# Patient Record
Sex: Female | Born: 1950 | Race: White | Hispanic: No | Marital: Married | Smoking: Never smoker
Health system: Southern US, Community
[De-identification: ages and names within clinical notes are randomized; demographics above are authoritative.]

## PROBLEM LIST (undated history)

## (undated) DIAGNOSIS — G4730 Sleep apnea, unspecified: Secondary | ICD-10-CM

## (undated) DIAGNOSIS — I10 Essential (primary) hypertension: Secondary | ICD-10-CM

## (undated) DIAGNOSIS — N8189 Other female genital prolapse: Secondary | ICD-10-CM

## (undated) DIAGNOSIS — E119 Type 2 diabetes mellitus without complications: Secondary | ICD-10-CM

## (undated) DIAGNOSIS — G43909 Migraine, unspecified, not intractable, without status migrainosus: Secondary | ICD-10-CM

## (undated) DIAGNOSIS — K219 Gastro-esophageal reflux disease without esophagitis: Secondary | ICD-10-CM

## (undated) DIAGNOSIS — IMO0002 Reserved for concepts with insufficient information to code with codable children: Secondary | ICD-10-CM

## (undated) DIAGNOSIS — M1990 Unspecified osteoarthritis, unspecified site: Secondary | ICD-10-CM

## (undated) DIAGNOSIS — E785 Hyperlipidemia, unspecified: Secondary | ICD-10-CM

## (undated) DIAGNOSIS — R0902 Hypoxemia: Secondary | ICD-10-CM

## (undated) DIAGNOSIS — C801 Malignant (primary) neoplasm, unspecified: Secondary | ICD-10-CM

## (undated) DIAGNOSIS — T7840XA Allergy, unspecified, initial encounter: Secondary | ICD-10-CM

## (undated) HISTORY — PX: EYE SURGERY: SHX253

## (undated) HISTORY — DX: Gastro-esophageal reflux disease without esophagitis: K21.9

## (undated) HISTORY — DX: Essential (primary) hypertension: I10

## (undated) HISTORY — DX: Type 2 diabetes mellitus without complications: E11.9

## (undated) HISTORY — DX: Hyperlipidemia, unspecified: E78.5

## (undated) HISTORY — PX: OTHER SURGICAL HISTORY: SHX169

## (undated) HISTORY — PX: SPINE SURGERY: SHX786

## (undated) HISTORY — DX: Other female genital prolapse: N81.89

## (undated) HISTORY — DX: Allergy, unspecified, initial encounter: T78.40XA

## (undated) HISTORY — DX: Migraine, unspecified, not intractable, without status migrainosus: G43.909

## (undated) HISTORY — DX: Hypoxemia: R09.02

## (undated) HISTORY — DX: Unspecified osteoarthritis, unspecified site: M19.90

## (undated) HISTORY — DX: Sleep apnea, unspecified: G47.30

## (undated) HISTORY — DX: Reserved for concepts with insufficient information to code with codable children: IMO0002

---

## 1980-12-14 HISTORY — PX: TUBAL LIGATION: SHX77

## 1994-12-14 HISTORY — PX: VAGINAL HYSTERECTOMY: SUR661

## 2008-12-14 HISTORY — PX: KNEE ARTHROSCOPY: SHX127

## 2009-04-08 ENCOUNTER — Ambulatory Visit: Payer: Self-pay | Admitting: Family Medicine

## 2009-06-20 ENCOUNTER — Emergency Department: Payer: Self-pay | Admitting: Emergency Medicine

## 2009-12-14 HISTORY — PX: COLONOSCOPY: SHX5424

## 2010-04-13 DIAGNOSIS — I219 Acute myocardial infarction, unspecified: Secondary | ICD-10-CM

## 2010-04-13 HISTORY — DX: Acute myocardial infarction, unspecified: I21.9

## 2010-04-24 ENCOUNTER — Emergency Department: Payer: Self-pay | Admitting: Emergency Medicine

## 2010-12-25 ENCOUNTER — Ambulatory Visit: Payer: Self-pay | Admitting: Family Medicine

## 2010-12-30 ENCOUNTER — Ambulatory Visit: Payer: Self-pay | Admitting: Family Medicine

## 2010-12-31 ENCOUNTER — Ambulatory Visit: Payer: Self-pay | Admitting: Family Medicine

## 2012-01-05 ENCOUNTER — Ambulatory Visit: Payer: Self-pay | Admitting: Family Medicine

## 2012-03-03 DIAGNOSIS — M51379 Other intervertebral disc degeneration, lumbosacral region without mention of lumbar back pain or lower extremity pain: Secondary | ICD-10-CM | POA: Insufficient documentation

## 2012-03-03 DIAGNOSIS — M5137 Other intervertebral disc degeneration, lumbosacral region: Secondary | ICD-10-CM | POA: Insufficient documentation

## 2012-03-03 DIAGNOSIS — IMO0002 Reserved for concepts with insufficient information to code with codable children: Secondary | ICD-10-CM | POA: Insufficient documentation

## 2012-03-03 DIAGNOSIS — M48061 Spinal stenosis, lumbar region without neurogenic claudication: Secondary | ICD-10-CM | POA: Insufficient documentation

## 2012-08-31 ENCOUNTER — Ambulatory Visit: Payer: Self-pay | Admitting: Family Medicine

## 2012-09-13 ENCOUNTER — Ambulatory Visit: Payer: Self-pay | Admitting: Family Medicine

## 2013-02-28 ENCOUNTER — Ambulatory Visit: Payer: Self-pay | Admitting: Family Medicine

## 2014-03-22 ENCOUNTER — Ambulatory Visit: Payer: Self-pay | Admitting: Family Medicine

## 2014-08-28 DIAGNOSIS — Z01810 Encounter for preprocedural cardiovascular examination: Secondary | ICD-10-CM | POA: Diagnosis not present

## 2014-08-28 DIAGNOSIS — E785 Hyperlipidemia, unspecified: Secondary | ICD-10-CM | POA: Diagnosis not present

## 2014-08-28 DIAGNOSIS — I10 Essential (primary) hypertension: Secondary | ICD-10-CM | POA: Diagnosis not present

## 2014-08-28 DIAGNOSIS — I2510 Atherosclerotic heart disease of native coronary artery without angina pectoris: Secondary | ICD-10-CM | POA: Diagnosis not present

## 2014-08-29 DIAGNOSIS — E119 Type 2 diabetes mellitus without complications: Secondary | ICD-10-CM | POA: Diagnosis not present

## 2014-08-29 DIAGNOSIS — I10 Essential (primary) hypertension: Secondary | ICD-10-CM | POA: Diagnosis not present

## 2014-08-29 DIAGNOSIS — G43909 Migraine, unspecified, not intractable, without status migrainosus: Secondary | ICD-10-CM | POA: Diagnosis not present

## 2014-08-29 DIAGNOSIS — K219 Gastro-esophageal reflux disease without esophagitis: Secondary | ICD-10-CM | POA: Diagnosis not present

## 2014-08-29 DIAGNOSIS — Z0000 Encounter for general adult medical examination without abnormal findings: Secondary | ICD-10-CM | POA: Diagnosis not present

## 2014-08-29 DIAGNOSIS — J3089 Other allergic rhinitis: Secondary | ICD-10-CM | POA: Diagnosis not present

## 2014-08-29 DIAGNOSIS — E785 Hyperlipidemia, unspecified: Secondary | ICD-10-CM | POA: Diagnosis not present

## 2014-08-29 DIAGNOSIS — Z23 Encounter for immunization: Secondary | ICD-10-CM | POA: Diagnosis not present

## 2014-09-03 DIAGNOSIS — G4730 Sleep apnea, unspecified: Secondary | ICD-10-CM | POA: Insufficient documentation

## 2014-09-03 DIAGNOSIS — Z01818 Encounter for other preprocedural examination: Secondary | ICD-10-CM | POA: Diagnosis not present

## 2014-09-03 DIAGNOSIS — Z79899 Other long term (current) drug therapy: Secondary | ICD-10-CM | POA: Diagnosis not present

## 2014-09-11 DIAGNOSIS — E119 Type 2 diabetes mellitus without complications: Secondary | ICD-10-CM | POA: Diagnosis not present

## 2014-09-11 DIAGNOSIS — L919 Hypertrophic disorder of the skin, unspecified: Secondary | ICD-10-CM | POA: Diagnosis not present

## 2014-09-11 DIAGNOSIS — I10 Essential (primary) hypertension: Secondary | ICD-10-CM | POA: Diagnosis not present

## 2014-09-11 DIAGNOSIS — I252 Old myocardial infarction: Secondary | ICD-10-CM | POA: Diagnosis not present

## 2014-09-11 DIAGNOSIS — L909 Atrophic disorder of skin, unspecified: Secondary | ICD-10-CM | POA: Diagnosis not present

## 2014-09-11 DIAGNOSIS — Z9861 Coronary angioplasty status: Secondary | ICD-10-CM | POA: Diagnosis not present

## 2014-09-11 DIAGNOSIS — D2310 Other benign neoplasm of skin of unspecified eyelid, including canthus: Secondary | ICD-10-CM | POA: Diagnosis not present

## 2014-09-11 DIAGNOSIS — Z79899 Other long term (current) drug therapy: Secondary | ICD-10-CM | POA: Diagnosis not present

## 2014-09-11 DIAGNOSIS — I2510 Atherosclerotic heart disease of native coronary artery without angina pectoris: Secondary | ICD-10-CM | POA: Diagnosis not present

## 2014-09-11 DIAGNOSIS — H02839 Dermatochalasis of unspecified eye, unspecified eyelid: Secondary | ICD-10-CM | POA: Diagnosis not present

## 2014-09-11 DIAGNOSIS — L989 Disorder of the skin and subcutaneous tissue, unspecified: Secondary | ICD-10-CM | POA: Diagnosis not present

## 2014-09-20 DIAGNOSIS — H02834 Dermatochalasis of left upper eyelid: Secondary | ICD-10-CM

## 2014-09-20 DIAGNOSIS — H02831 Dermatochalasis of right upper eyelid: Secondary | ICD-10-CM | POA: Insufficient documentation

## 2014-10-02 DIAGNOSIS — Z01419 Encounter for gynecological examination (general) (routine) without abnormal findings: Secondary | ICD-10-CM | POA: Diagnosis not present

## 2014-10-10 DIAGNOSIS — M170 Bilateral primary osteoarthritis of knee: Secondary | ICD-10-CM | POA: Diagnosis not present

## 2014-10-10 DIAGNOSIS — M179 Osteoarthritis of knee, unspecified: Secondary | ICD-10-CM | POA: Diagnosis not present

## 2014-11-14 ENCOUNTER — Ambulatory Visit: Payer: Self-pay | Admitting: Emergency Medicine

## 2014-11-14 DIAGNOSIS — I10 Essential (primary) hypertension: Secondary | ICD-10-CM | POA: Diagnosis not present

## 2014-11-14 DIAGNOSIS — N390 Urinary tract infection, site not specified: Secondary | ICD-10-CM | POA: Diagnosis not present

## 2014-11-14 LAB — URINALYSIS, COMPLETE
Bacteria: NEGATIVE
Bilirubin,UR: NEGATIVE
Blood: NEGATIVE
Glucose,UR: NEGATIVE
Ketone: NEGATIVE
Nitrite: NEGATIVE
Ph: 5.5 (ref 5.0–8.0)
Protein: NEGATIVE
RBC,UR: NONE SEEN /HPF (ref 0–5)
Specific Gravity: 1.01 (ref 1.000–1.030)
Squamous Epithelial: NONE SEEN
WBC UR: >30 /HPF (ref 0–5)

## 2014-11-16 LAB — URINE CULTURE

## 2014-12-12 DIAGNOSIS — J329 Chronic sinusitis, unspecified: Secondary | ICD-10-CM | POA: Diagnosis not present

## 2014-12-12 DIAGNOSIS — H109 Unspecified conjunctivitis: Secondary | ICD-10-CM | POA: Diagnosis not present

## 2014-12-12 DIAGNOSIS — N390 Urinary tract infection, site not specified: Secondary | ICD-10-CM | POA: Diagnosis not present

## 2015-01-10 DIAGNOSIS — Z23 Encounter for immunization: Secondary | ICD-10-CM | POA: Diagnosis not present

## 2015-01-25 DIAGNOSIS — E119 Type 2 diabetes mellitus without complications: Secondary | ICD-10-CM | POA: Diagnosis not present

## 2015-01-25 DIAGNOSIS — E784 Other hyperlipidemia: Secondary | ICD-10-CM | POA: Diagnosis not present

## 2015-01-25 DIAGNOSIS — I10 Essential (primary) hypertension: Secondary | ICD-10-CM | POA: Diagnosis not present

## 2015-02-27 DIAGNOSIS — G43C1 Periodic headache syndromes in child or adult, intractable: Secondary | ICD-10-CM | POA: Diagnosis not present

## 2015-02-27 DIAGNOSIS — E784 Other hyperlipidemia: Secondary | ICD-10-CM | POA: Diagnosis not present

## 2015-02-27 DIAGNOSIS — Z1231 Encounter for screening mammogram for malignant neoplasm of breast: Secondary | ICD-10-CM | POA: Diagnosis not present

## 2015-02-27 DIAGNOSIS — K219 Gastro-esophageal reflux disease without esophagitis: Secondary | ICD-10-CM | POA: Diagnosis not present

## 2015-02-27 DIAGNOSIS — I10 Essential (primary) hypertension: Secondary | ICD-10-CM | POA: Diagnosis not present

## 2015-02-27 DIAGNOSIS — E119 Type 2 diabetes mellitus without complications: Secondary | ICD-10-CM | POA: Diagnosis not present

## 2015-02-27 DIAGNOSIS — J3081 Allergic rhinitis due to animal (cat) (dog) hair and dander: Secondary | ICD-10-CM | POA: Diagnosis not present

## 2015-02-27 LAB — LIPID PANEL
Cholesterol: 167 mg/dL (ref 0–200)
HDL: 46 mg/dL (ref 35–70)
LDL Cholesterol: 93 mg/dL
Triglycerides: 141 mg/dL (ref 40–160)

## 2015-02-27 LAB — BASIC METABOLIC PANEL
Creatinine: 0.6 mg/dL (ref <=1.1)
Glucose: 143 mg/dL

## 2015-02-27 LAB — HEMOGLOBIN A1C: Hgb A1c MFr Bld: 7.4 % — AB (ref 4.0–6.0)

## 2015-03-13 DIAGNOSIS — H21233 Degeneration of iris (pigmentary), bilateral: Secondary | ICD-10-CM | POA: Diagnosis not present

## 2015-03-13 DIAGNOSIS — E119 Type 2 diabetes mellitus without complications: Secondary | ICD-10-CM | POA: Diagnosis not present

## 2015-03-25 ENCOUNTER — Ambulatory Visit
Admit: 2015-03-25 | Disposition: A | Discharge status: Home / Self Care | Payer: Self-pay | Attending: Family Medicine | Admitting: Family Medicine

## 2015-03-25 DIAGNOSIS — Z1231 Encounter for screening mammogram for malignant neoplasm of breast: Secondary | ICD-10-CM | POA: Diagnosis not present

## 2015-03-25 LAB — HM MAMMOGRAPHY: HM Mammogram: NORMAL

## 2015-04-03 DIAGNOSIS — E7849 Other hyperlipidemia: Secondary | ICD-10-CM

## 2015-04-03 DIAGNOSIS — E1165 Type 2 diabetes mellitus with hyperglycemia: Secondary | ICD-10-CM | POA: Insufficient documentation

## 2015-04-03 DIAGNOSIS — Z0000 Encounter for general adult medical examination without abnormal findings: Secondary | ICD-10-CM | POA: Insufficient documentation

## 2015-04-03 DIAGNOSIS — J3081 Allergic rhinitis due to animal (cat) (dog) hair and dander: Secondary | ICD-10-CM | POA: Insufficient documentation

## 2015-04-03 DIAGNOSIS — R51 Headache: Secondary | ICD-10-CM

## 2015-04-03 DIAGNOSIS — I2510 Atherosclerotic heart disease of native coronary artery without angina pectoris: Secondary | ICD-10-CM | POA: Insufficient documentation

## 2015-04-03 DIAGNOSIS — Z794 Long term (current) use of insulin: Secondary | ICD-10-CM

## 2015-04-03 DIAGNOSIS — R739 Hyperglycemia, unspecified: Secondary | ICD-10-CM | POA: Insufficient documentation

## 2015-04-03 DIAGNOSIS — R519 Headache, unspecified: Secondary | ICD-10-CM | POA: Insufficient documentation

## 2015-04-03 DIAGNOSIS — I10 Essential (primary) hypertension: Secondary | ICD-10-CM | POA: Insufficient documentation

## 2015-04-03 DIAGNOSIS — E785 Hyperlipidemia, unspecified: Secondary | ICD-10-CM | POA: Insufficient documentation

## 2015-04-24 DIAGNOSIS — R202 Paresthesia of skin: Secondary | ICD-10-CM | POA: Diagnosis not present

## 2015-04-24 DIAGNOSIS — R200 Anesthesia of skin: Secondary | ICD-10-CM | POA: Diagnosis not present

## 2015-04-24 DIAGNOSIS — E119 Type 2 diabetes mellitus without complications: Secondary | ICD-10-CM | POA: Diagnosis not present

## 2015-04-24 DIAGNOSIS — E1165 Type 2 diabetes mellitus with hyperglycemia: Secondary | ICD-10-CM | POA: Diagnosis not present

## 2015-07-16 DIAGNOSIS — R202 Paresthesia of skin: Secondary | ICD-10-CM | POA: Diagnosis not present

## 2015-07-16 DIAGNOSIS — E119 Type 2 diabetes mellitus without complications: Secondary | ICD-10-CM | POA: Diagnosis not present

## 2015-07-16 DIAGNOSIS — E538 Deficiency of other specified B group vitamins: Secondary | ICD-10-CM | POA: Diagnosis not present

## 2015-07-16 DIAGNOSIS — R200 Anesthesia of skin: Secondary | ICD-10-CM | POA: Diagnosis not present

## 2015-07-23 DIAGNOSIS — E1165 Type 2 diabetes mellitus with hyperglycemia: Secondary | ICD-10-CM | POA: Diagnosis not present

## 2015-07-23 DIAGNOSIS — E785 Hyperlipidemia, unspecified: Secondary | ICD-10-CM | POA: Diagnosis not present

## 2015-07-23 DIAGNOSIS — E538 Deficiency of other specified B group vitamins: Secondary | ICD-10-CM | POA: Diagnosis not present

## 2015-07-31 DIAGNOSIS — M25551 Pain in right hip: Secondary | ICD-10-CM | POA: Diagnosis not present

## 2015-07-31 DIAGNOSIS — M1611 Unilateral primary osteoarthritis, right hip: Secondary | ICD-10-CM | POA: Insufficient documentation

## 2015-07-31 DIAGNOSIS — M7061 Trochanteric bursitis, right hip: Secondary | ICD-10-CM | POA: Diagnosis not present

## 2015-08-08 DIAGNOSIS — M1611 Unilateral primary osteoarthritis, right hip: Secondary | ICD-10-CM | POA: Diagnosis not present

## 2015-08-13 ENCOUNTER — Other Ambulatory Visit: Payer: Self-pay | Admitting: Family Medicine

## 2015-08-25 ENCOUNTER — Other Ambulatory Visit: Payer: Self-pay | Admitting: Family Medicine

## 2015-08-25 DIAGNOSIS — I10 Essential (primary) hypertension: Secondary | ICD-10-CM

## 2015-09-02 ENCOUNTER — Encounter: Payer: Self-pay | Admitting: Family Medicine

## 2015-09-02 ENCOUNTER — Ambulatory Visit (INDEPENDENT_AMBULATORY_CARE_PROVIDER_SITE_OTHER): Payer: Medicare Other | Admitting: Family Medicine

## 2015-09-02 VITALS — BP 120/80 | HR 58 | Ht 65.0 in | Wt 185.0 lb

## 2015-09-02 DIAGNOSIS — Z23 Encounter for immunization: Secondary | ICD-10-CM | POA: Diagnosis not present

## 2015-09-02 DIAGNOSIS — I10 Essential (primary) hypertension: Secondary | ICD-10-CM

## 2015-09-02 DIAGNOSIS — E785 Hyperlipidemia, unspecified: Secondary | ICD-10-CM

## 2015-09-02 DIAGNOSIS — E784 Other hyperlipidemia: Secondary | ICD-10-CM

## 2015-09-02 DIAGNOSIS — J301 Allergic rhinitis due to pollen: Secondary | ICD-10-CM | POA: Diagnosis not present

## 2015-09-02 DIAGNOSIS — E7849 Other hyperlipidemia: Secondary | ICD-10-CM

## 2015-09-02 DIAGNOSIS — K219 Gastro-esophageal reflux disease without esophagitis: Secondary | ICD-10-CM

## 2015-09-02 DIAGNOSIS — Z0000 Encounter for general adult medical examination without abnormal findings: Secondary | ICD-10-CM | POA: Diagnosis not present

## 2015-09-02 DIAGNOSIS — Z1239 Encounter for other screening for malignant neoplasm of breast: Secondary | ICD-10-CM | POA: Diagnosis not present

## 2015-09-02 DIAGNOSIS — Z1231 Encounter for screening mammogram for malignant neoplasm of breast: Secondary | ICD-10-CM | POA: Diagnosis not present

## 2015-09-02 DIAGNOSIS — G43109 Migraine with aura, not intractable, without status migrainosus: Secondary | ICD-10-CM | POA: Diagnosis not present

## 2015-09-02 MED ORDER — AMLODIPINE BESYLATE 5 MG PO TABS: 5.0000 mg | ORAL_TABLET | Freq: Every day | ORAL | Status: DC

## 2015-09-02 MED ORDER — ATORVASTATIN CALCIUM 40 MG PO TABS: 40.0000 mg | ORAL_TABLET | Freq: Every day | ORAL | Status: DC

## 2015-09-02 MED ORDER — FLUTICASONE PROPIONATE 50 MCG/ACT NA SUSP: 2.0000 | Freq: Every day | NASAL | Status: DC

## 2015-09-02 MED ORDER — HYDROCHLOROTHIAZIDE 25 MG PO TABS: 25.0000 mg | ORAL_TABLET | Freq: Every morning | ORAL | Status: DC

## 2015-09-02 MED ORDER — SUMATRIPTAN SUCCINATE 50 MG PO TABS: 50.0000 mg | ORAL_TABLET | ORAL | Status: DC | PRN

## 2015-09-02 MED ORDER — PANTOPRAZOLE SODIUM 40 MG PO TBEC: 40.0000 mg | DELAYED_RELEASE_TABLET | Freq: Every day | ORAL | Status: DC

## 2015-09-02 MED ORDER — LISINOPRIL 10 MG PO TABS: 10.0000 mg | ORAL_TABLET | Freq: Every day | ORAL | Status: DC

## 2015-09-02 MED ORDER — MONTELUKAST SODIUM 10 MG PO TABS: 10.0000 mg | ORAL_TABLET | Freq: Every day | ORAL | Status: DC

## 2015-09-02 MED ORDER — METOPROLOL SUCCINATE ER 50 MG PO TB24: 50.0000 mg | ORAL_TABLET | Freq: Every day | ORAL | Status: DC

## 2015-09-02 NOTE — Progress Notes (Signed)
Name: Nicole Cook   MRN: [id number]    DOB: 02-04-1951   Date:09/02/2015       Progress Note  Subjective  Chief Complaint  Chief Complaint  Patient presents with  . Annual Exam    no pap  . Gastrophageal Reflux  . Hypertension  . Hyperlipidemia  . Allergic Rhinitis   . Headache    migraine- imitrex refill    HPI Comments: Annual physical exam with no  subjective/objective concerns.  Gastrophageal Reflux She complains of heartburn. She reports no abdominal pain, no belching, no chest pain, no choking, no coughing, no dysphagia, no nausea, no sore throat or no wheezing. This is a new problem. The current episode started more than 1 year ago. The problem occurs occasionally. The problem has been gradually improving. The symptoms are aggravated by certain foods (supine position). Pertinent negatives include no anemia, fatigue, melena, muscle weakness, orthopnea or weight loss. There are no known risk factors. She has tried a PPI for the symptoms. The treatment provided mild relief.  Hypertension This is a chronic problem. The current episode started more than 1 year ago. The problem has been waxing and waning since onset. The problem is controlled. Associated symptoms include blurred vision, headaches and neck pain. Pertinent negatives include no anxiety, chest pain, malaise/fatigue, palpitations or shortness of breath. There are no associated agents to hypertension. Risk factors for coronary artery disease include diabetes mellitus, dyslipidemia and obesity. Past treatments include beta blockers, ACE inhibitors, diuretics and calcium channel blockers. The current treatment provides mild improvement. There are no compliance problems.  There is no history of angina, kidney disease, CAD/MI, CVA, heart failure, left ventricular hypertrophy, PVD, renovascular disease or retinopathy. There is no history of chronic renal disease.  Hyperlipidemia This is a chronic problem. The current episode  started more than 1 year ago. The problem is controlled. Recent lipid tests were reviewed and are low. Exacerbating diseases include obesity. She has no history of chronic renal disease, diabetes, hypothyroidism, liver disease or nephrotic syndrome. There are no known factors aggravating her hyperlipidemia. Pertinent negatives include no chest pain, focal sensory loss, focal weakness, leg pain, myalgias or shortness of breath. The current treatment provides mild improvement of lipids. There are no compliance problems.   Headache  This is a recurrent problem. The current episode started more than 1 year ago. The problem occurs intermittently. The problem has been waxing and waning. The pain is located in the retro-orbital and bilateral region. The pain quality is similar to prior headaches. The quality of the pain is described as aching. The pain is at a severity of 5/10. Associated symptoms include blurred vision, neck pain, phonophobia and photophobia. Pertinent negatives include no abdominal pain, abnormal behavior, back pain, coughing, dizziness, ear pain, fever, insomnia, nausea, sore throat, tingling, tinnitus or weight loss. Nothing aggravates the symptoms. She has tried triptans for the symptoms. The treatment provided moderate relief. Her past medical history is significant for hypertension and obesity.    No problem-specific assessment & plan notes found for this encounter.   Past Medical History  Diagnosis Date  . Allergy   . Diabetes mellitus without complication   . GERD (gastroesophageal reflux disease)   . Migraine   . Hyperlipidemia   . Hypertension     Past Surgical History  Procedure Laterality Date  . Back surgery x 2    . C-section x 3    . Vaginal hysterectomy  12/14/1994  . Knee arthroscopy Right 2010  .  Colonoscopy  2011    cleared for 5 yrs- Dr Beckey Downing    History reviewed. No pertinent family history.  Social History   Social History  . Marital Status:  Married    Spouse Name: N/A  . Number of Children: N/A  . Years of Education: N/A   Occupational History  . Not on file.   Social History Main Topics  . Smoking status: Never Smoker   . Smokeless tobacco: Not on file  . Alcohol Use: No  . Drug Use: No  . Sexual Activity: Yes   Other Topics Concern  . Not on file   Social History Narrative    Allergies  Allergen Reactions  . Compazine [Prochlorperazine]   . Penicillins   . Sulfa Antibiotics      Review of Systems  Constitutional: Negative for fever, chills, weight loss, malaise/fatigue and fatigue.  HENT: Negative for ear discharge, ear pain, sore throat and tinnitus.   Eyes: Positive for blurred vision and photophobia.  Respiratory: Negative for cough, sputum production, choking, shortness of breath and wheezing.   Cardiovascular: Negative for chest pain, palpitations and leg swelling.  Gastrointestinal: Positive for heartburn. Negative for dysphagia, nausea, abdominal pain, diarrhea, constipation, blood in stool and melena.  Genitourinary: Negative for dysuria, urgency, frequency and hematuria.  Musculoskeletal: Positive for neck pain. Negative for myalgias, back pain, joint pain and muscle weakness.  Skin: Negative for rash.  Neurological: Positive for headaches. Negative for dizziness, tingling, sensory change and focal weakness.  Endo/Heme/Allergies: Negative for environmental allergies and polydipsia. Does not bruise/bleed easily.  Psychiatric/Behavioral: Negative for depression and suicidal ideas. The patient is not nervous/anxious and does not have insomnia.      Objective  Filed Vitals:   09/02/15 0950  BP: 120/80  Pulse: 58  Height: 5\' 5"  (1.651 m)  Weight: 185 lb (83.915 kg)    Physical Exam  Constitutional: She is well-developed, well-nourished, and in no distress. No distress.  HENT:  Head: Normocephalic and atraumatic.  Right Ear: External ear normal.  Left Ear: External ear normal.  Nose:  Nose normal.  Mouth/Throat: Oropharynx is clear and moist.  Eyes: Conjunctivae and EOM are normal. Pupils are equal, round, and reactive to light. Right eye exhibits no discharge. Left eye exhibits no discharge.  Neck: Normal range of motion. Neck supple. No JVD present. No thyromegaly present.  Cardiovascular: Normal rate, regular rhythm, normal heart sounds and intact distal pulses.  Exam reveals no gallop and no friction rub.   No murmur heard. Pulmonary/Chest: Effort normal and breath sounds normal. Right breast exhibits inverted nipple. Right breast exhibits no mass, no nipple discharge and no skin change. Left breast exhibits inverted nipple. Left breast exhibits no mass, no nipple discharge and no skin change.  Abdominal: Soft. Bowel sounds are normal. She exhibits no mass. There is no tenderness. There is no guarding.  Genitourinary: Rectum normal. Guaiac negative stool.  Musculoskeletal: Normal range of motion. She exhibits no edema.  Lymphadenopathy:    She has no cervical adenopathy.  Neurological: She is alert. She has normal reflexes.  Skin: Skin is warm and dry. She is not diaphoretic.  Psychiatric: Mood and affect normal.      Assessment & Plan  Problem List Items Addressed This Visit      Cardiovascular and Mediastinum   Essential (primary) hypertension   Relevant Medications   amLODipine (NORVASC) 5 MG tablet   atorvastatin (LIPITOR) 40 MG tablet   hydrochlorothiazide (HYDRODIURIL) 25 MG tablet  lisinopril (PRINIVIL,ZESTRIL) 10 MG tablet   metoprolol succinate (TOPROL-XL) 50 MG 24 hr tablet   Other Relevant Orders   Renal Function Panel     Other   Familial multiple lipoprotein-type hyperlipidemia   Relevant Medications   amLODipine (NORVASC) 5 MG tablet   atorvastatin (LIPITOR) 40 MG tablet   hydrochlorothiazide (HYDRODIURIL) 25 MG tablet   lisinopril (PRINIVIL,ZESTRIL) 10 MG tablet   metoprolol succinate (TOPROL-XL) 50 MG 24 hr tablet    Other Visit  Diagnoses    Annual physical exam    -  Primary    Hyperlipidemia        Relevant Medications    amLODipine (NORVASC) 5 MG tablet    atorvastatin (LIPITOR) 40 MG tablet    hydrochlorothiazide (HYDRODIURIL) 25 MG tablet    lisinopril (PRINIVIL,ZESTRIL) 10 MG tablet    metoprolol succinate (TOPROL-XL) 50 MG 24 hr tablet    Other Relevant Orders    Lipid Profile    Hepatic function panel    Gastroesophageal reflux disease, esophagitis presence not specified        Relevant Medications    pantoprazole (PROTONIX) 40 MG tablet    Migraine with aura and without status migrainosus, not intractable        Relevant Medications    amLODipine (NORVASC) 5 MG tablet    atorvastatin (LIPITOR) 40 MG tablet    hydrochlorothiazide (HYDRODIURIL) 25 MG tablet    lisinopril (PRINIVIL,ZESTRIL) 10 MG tablet    metoprolol succinate (TOPROL-XL) 50 MG 24 hr tablet    SUMAtriptan (IMITREX) 50 MG tablet    Essential hypertension        Relevant Medications    amLODipine (NORVASC) 5 MG tablet    atorvastatin (LIPITOR) 40 MG tablet    hydrochlorothiazide (HYDRODIURIL) 25 MG tablet    lisinopril (PRINIVIL,ZESTRIL) 10 MG tablet    metoprolol succinate (TOPROL-XL) 50 MG 24 hr tablet    Allergic rhinitis due to pollen        Relevant Medications    fluticasone (FLONASE) 50 MCG/ACT nasal spray    montelukast (SINGULAIR) 10 MG tablet    Need for influenza vaccination        Relevant Orders    Flu Vaccine QUAD 36+ mos PF IM (Fluarix & Fluzone Quad PF) (Completed)    Breast cancer screening        Visit for screening mammogram        Relevant Orders    MM SCREENING BREAST TOMO BILATERAL         Dr. Consuella Sc[facility]lock [facility]  09/02/2015

## 2015-09-03 LAB — HEPATIC FUNCTION PANEL
ALT: 38 IU/L — ABNORMAL HIGH (ref 0–32)
AST: 22 IU/L (ref 0–40)
Alkaline Phosphatase: 44 IU/L (ref 39–117)
Bilirubin Total: 0.6 mg/dL (ref 0.0–1.2)
Bilirubin, Direct: 0.18 mg/dL (ref 0.00–0.40)
Total Protein: 7 g/dL (ref 6.0–8.5)

## 2015-09-03 LAB — RENAL FUNCTION PANEL
Albumin: 4.6 g/dL (ref 3.6–4.8)
BUN/Creatinine Ratio: 15 (ref 11–26)
BUN: 11 mg/dL (ref 8–27)
CO2: 28 mmol/L (ref 18–29)
Calcium: 9.5 mg/dL (ref 8.7–10.3)
Chloride: 95 mmol/L — ABNORMAL LOW (ref 97–108)
Creatinine, Ser: 0.73 mg/dL (ref 0.57–1.00)
GFR calc Af Amer: 99 mL/min/{1.73_m2} (ref >59)
GFR calc non Af Amer: 86 mL/min/{1.73_m2} (ref >59)
Glucose: 154 mg/dL — ABNORMAL HIGH (ref 65–99)
Phosphorus: 3.7 mg/dL (ref 2.5–4.5)
Potassium: 4.6 mmol/L (ref 3.5–5.2)
Sodium: 142 mmol/L (ref 134–144)

## 2015-09-03 LAB — LIPID PANEL
Chol/HDL Ratio: 3.6 ratio units (ref 0.0–4.4)
Cholesterol, Total: 158 mg/dL (ref 100–199)
HDL: 44 mg/dL (ref >39)
LDL Calculated: 86 mg/dL (ref 0–99)
Triglycerides: 139 mg/dL (ref 0–149)
VLDL Cholesterol Cal: 28 mg/dL (ref 5–40)

## 2015-09-13 ENCOUNTER — Other Ambulatory Visit: Payer: Self-pay | Admitting: Family Medicine

## 2015-09-13 DIAGNOSIS — E119 Type 2 diabetes mellitus without complications: Secondary | ICD-10-CM

## 2015-09-19 DIAGNOSIS — I10 Essential (primary) hypertension: Secondary | ICD-10-CM | POA: Diagnosis not present

## 2015-09-19 DIAGNOSIS — G4730 Sleep apnea, unspecified: Secondary | ICD-10-CM | POA: Diagnosis not present

## 2015-09-19 DIAGNOSIS — E119 Type 2 diabetes mellitus without complications: Secondary | ICD-10-CM | POA: Diagnosis not present

## 2015-09-19 DIAGNOSIS — I2510 Atherosclerotic heart disease of native coronary artery without angina pectoris: Secondary | ICD-10-CM | POA: Diagnosis not present

## 2015-10-07 ENCOUNTER — Ambulatory Visit (INDEPENDENT_AMBULATORY_CARE_PROVIDER_SITE_OTHER): Payer: Medicare Other | Admitting: Family Medicine

## 2015-10-07 ENCOUNTER — Encounter: Payer: Self-pay | Admitting: Family Medicine

## 2015-10-07 VITALS — BP 120/80 | HR 64 | Ht 65.0 in | Wt 189.0 lb

## 2015-10-07 DIAGNOSIS — N3090 Cystitis, unspecified without hematuria: Secondary | ICD-10-CM | POA: Diagnosis not present

## 2015-10-07 LAB — POCT URINALYSIS DIPSTICK
Bilirubin, UA: NEGATIVE
Blood, UA: NEGATIVE
Glucose, UA: 250
Ketones, UA: NEGATIVE
Leukocytes, UA: NEGATIVE
Nitrite, UA: NEGATIVE
Protein, UA: NEGATIVE
Spec Grav, UA: 1.01
Urobilinogen, UA: 0.2
pH, UA: 5

## 2015-10-07 MED ORDER — NITROFURANTOIN MONOHYD MACRO 100 MG PO CAPS: 100.0000 mg | ORAL_CAPSULE | Freq: Two times a day (BID) | ORAL | Status: DC

## 2015-10-07 NOTE — Progress Notes (Signed)
Name: Nicole Cook   MRN: [id number]    DOB: [birth year]   Date:10/07/2015       Progress Note  Subjective  Chief Complaint  Chief Complaint  Patient presents with  . Urinary Tract Infection    started 3 days ago with frequency, urgency and burning at the end of stream.    Urinary Tract Infection  This is a new problem. The current episode started in the past 7 days. The problem occurs every urination. The problem has been gradually worsening. The quality of the pain is described as burning and aching. The pain is at a severity of 3/10. The pain is mild. There has been no fever. Associated symptoms include frequency and urgency. Pertinent negatives include no chills, discharge, flank pain, hematuria, nausea, possible pregnancy or sweats. She has tried nothing for the symptoms. The treatment provided no relief.    No problem-specific assessment & plan notes found for this encounter.   Past Medical History  Diagnosis Date  . Allergy   . Diabetes mellitus without complication ([place])   . GERD (gastroesophageal reflux disease)   . Migraine   . Hyperlipidemia   . Hypertension     Past Surgical History  Procedure Laterality Date  . Back surgery x 2    . C-section x 3    . Vaginal hysterectomy  12/14/1994  . Knee arthroscopy Right 2010  . Colonoscopy  2011    cleared for 5 yrs- Dr Beckey Downing    History reviewed. No pertinent family history.  Social History   Social History  . Marital Status: Married    Spouse Name: N/A  . Number of Children: N/A  . Years of Education: N/A   Occupational History  . Not on file.   Social History Main Topics  . Smoking status: Never Smoker   . Smokeless tobacco: Not on file  . Alcohol Use: No  . Drug Use: No  . Sexual Activity: Yes   Other Topics Concern  . Not on file   Social History Narrative    Allergies  Allergen Reactions  . Compazine [Prochlorperazine]   . Penicillins   . Sulfa Antibiotics      Review of Systems   Constitutional: Negative for fever, chills, weight loss and malaise/fatigue.  HENT: Negative for ear discharge, ear pain and sore throat.   Eyes: Negative for blurred vision.  Respiratory: Negative for cough, sputum production, shortness of breath and wheezing.   Cardiovascular: Negative for chest pain, palpitations and leg swelling.  Gastrointestinal: Negative for heartburn, nausea, abdominal pain, diarrhea, constipation, blood in stool and melena.  Genitourinary: Positive for urgency and frequency. Negative for dysuria, hematuria and flank pain.  Musculoskeletal: Negative for myalgias, back pain, joint pain and neck pain.  Skin: Negative for rash.  Neurological: Negative for dizziness, tingling, sensory change, focal weakness and headaches.  Endo/Heme/Allergies: Negative for environmental allergies and polydipsia. Does not bruise/bleed easily.  Psychiatric/Behavioral: Negative for depression and suicidal ideas. The patient is not nervous/anxious and does not have insomnia.      Objective  Filed Vitals:   10/07/15 1506  BP: 120/80  Pulse: 64  Height: 5\' 5"  (1.651 m)  Weight: 189 lb (85.73 kg)    Physical Exam  Constitutional: She is well-developed, well-nourished, and in no distress. No distress.  HENT:  Head: Normocephalic and atraumatic.  Right Ear: External ear normal.  Left Ear: External ear normal.  Nose: Nose normal.  Mouth/Throat: Oropharynx is clear and moist.  Eyes: Conjunctivae  and EOM are normal. Pupils are equal, round, and reactive to light. Right eye exhibits no discharge. Left eye exhibits no discharge.  Neck: Normal range of motion. Neck supple. No JVD present. No thyromegaly present.  Cardiovascular: Normal rate, regular rhythm, normal heart sounds and intact distal pulses.  Exam reveals no gallop and no friction rub.   No murmur heard. Pulmonary/Chest: Effort normal and breath sounds normal.  Abdominal: Soft. Bowel sounds are normal. She exhibits no mass.  There is no tenderness. There is no guarding.  Musculoskeletal: Normal range of motion. She exhibits no edema.  Lymphadenopathy:    She has no cervical adenopathy.  Neurological: She is alert. She has normal reflexes.  Skin: Skin is warm and dry. She is not diaphoretic.  Psychiatric: Mood and affect normal.      Assessment & Plan  Problem List Items Addressed This Visit    None    Visit Diagnoses    Cystitis    -  Primary    Relevant Medications    nitrofurantoin, macrocrystal-monohydrate, (MACROBID) 100 MG capsule    Other Relevant Orders    POCT Urinalysis Dipstick (Completed)         Dr. [facility]  10/07/2015

## 2015-10-16 DIAGNOSIS — E1165 Type 2 diabetes mellitus with hyperglycemia: Secondary | ICD-10-CM | POA: Diagnosis not present

## 2015-10-16 DIAGNOSIS — E538 Deficiency of other specified B group vitamins: Secondary | ICD-10-CM | POA: Diagnosis not present

## 2015-10-16 DIAGNOSIS — E785 Hyperlipidemia, unspecified: Secondary | ICD-10-CM | POA: Diagnosis not present

## 2015-10-22 DIAGNOSIS — E785 Hyperlipidemia, unspecified: Secondary | ICD-10-CM | POA: Diagnosis not present

## 2015-10-22 DIAGNOSIS — E538 Deficiency of other specified B group vitamins: Secondary | ICD-10-CM | POA: Diagnosis not present

## 2015-10-22 DIAGNOSIS — E1165 Type 2 diabetes mellitus with hyperglycemia: Secondary | ICD-10-CM | POA: Diagnosis not present

## 2015-10-22 DIAGNOSIS — Z794 Long term (current) use of insulin: Secondary | ICD-10-CM | POA: Diagnosis not present

## 2015-10-25 DIAGNOSIS — Z1231 Encounter for screening mammogram for malignant neoplasm of breast: Secondary | ICD-10-CM | POA: Diagnosis not present

## 2015-10-25 DIAGNOSIS — Z01419 Encounter for gynecological examination (general) (routine) without abnormal findings: Secondary | ICD-10-CM | POA: Diagnosis not present

## 2015-10-25 DIAGNOSIS — N952 Postmenopausal atrophic vaginitis: Secondary | ICD-10-CM | POA: Diagnosis not present

## 2015-10-25 DIAGNOSIS — Z808 Family history of malignant neoplasm of other organs or systems: Secondary | ICD-10-CM | POA: Diagnosis not present

## 2015-10-25 DIAGNOSIS — Z6831 Body mass index (BMI) 31.0-31.9, adult: Secondary | ICD-10-CM | POA: Diagnosis not present

## 2015-11-05 DIAGNOSIS — Z0000 Encounter for general adult medical examination without abnormal findings: Secondary | ICD-10-CM | POA: Diagnosis not present

## 2016-01-02 ENCOUNTER — Other Ambulatory Visit: Payer: Self-pay

## 2016-02-25 DIAGNOSIS — Z794 Long term (current) use of insulin: Secondary | ICD-10-CM | POA: Diagnosis not present

## 2016-02-25 DIAGNOSIS — E1165 Type 2 diabetes mellitus with hyperglycemia: Secondary | ICD-10-CM | POA: Diagnosis not present

## 2016-02-27 DIAGNOSIS — Z6831 Body mass index (BMI) 31.0-31.9, adult: Secondary | ICD-10-CM | POA: Diagnosis not present

## 2016-02-27 DIAGNOSIS — N766 Ulceration of vulva: Secondary | ICD-10-CM | POA: Diagnosis not present

## 2016-02-27 DIAGNOSIS — N771 Vaginitis, vulvitis and vulvovaginitis in diseases classified elsewhere: Secondary | ICD-10-CM | POA: Diagnosis not present

## 2016-03-02 ENCOUNTER — Encounter: Payer: Self-pay | Admitting: Family Medicine

## 2016-03-02 ENCOUNTER — Ambulatory Visit (INDEPENDENT_AMBULATORY_CARE_PROVIDER_SITE_OTHER): Payer: Medicare Other | Admitting: Family Medicine

## 2016-03-02 ENCOUNTER — Other Ambulatory Visit: Payer: Self-pay | Admitting: Family Medicine

## 2016-03-02 VITALS — BP 140/80 | HR 70 | Ht 65.0 in | Wt 181.0 lb

## 2016-03-02 DIAGNOSIS — Z1159 Encounter for screening for other viral diseases: Secondary | ICD-10-CM | POA: Diagnosis not present

## 2016-03-02 DIAGNOSIS — E784 Other hyperlipidemia: Secondary | ICD-10-CM

## 2016-03-02 DIAGNOSIS — G43109 Migraine with aura, not intractable, without status migrainosus: Secondary | ICD-10-CM | POA: Diagnosis not present

## 2016-03-02 DIAGNOSIS — E785 Hyperlipidemia, unspecified: Secondary | ICD-10-CM

## 2016-03-02 DIAGNOSIS — R69 Illness, unspecified: Secondary | ICD-10-CM

## 2016-03-02 DIAGNOSIS — J301 Allergic rhinitis due to pollen: Secondary | ICD-10-CM | POA: Diagnosis not present

## 2016-03-02 DIAGNOSIS — I10 Essential (primary) hypertension: Secondary | ICD-10-CM

## 2016-03-02 DIAGNOSIS — K219 Gastro-esophageal reflux disease without esophagitis: Secondary | ICD-10-CM | POA: Diagnosis not present

## 2016-03-02 DIAGNOSIS — J4520 Mild intermittent asthma, uncomplicated: Secondary | ICD-10-CM | POA: Diagnosis not present

## 2016-03-02 DIAGNOSIS — R51 Headache: Secondary | ICD-10-CM

## 2016-03-02 DIAGNOSIS — R519 Headache, unspecified: Secondary | ICD-10-CM

## 2016-03-02 DIAGNOSIS — E7849 Other hyperlipidemia: Secondary | ICD-10-CM

## 2016-03-02 MED ORDER — PANTOPRAZOLE SODIUM 40 MG PO TBEC: 40.0000 mg | DELAYED_RELEASE_TABLET | Freq: Every day | ORAL | Status: DC

## 2016-03-02 MED ORDER — MONTELUKAST SODIUM 10 MG PO TABS: 10.0000 mg | ORAL_TABLET | Freq: Every day | ORAL | Status: DC

## 2016-03-02 MED ORDER — SUMATRIPTAN SUCCINATE 50 MG PO TABS: 50.0000 mg | ORAL_TABLET | ORAL | Status: DC | PRN

## 2016-03-02 MED ORDER — METOPROLOL SUCCINATE ER 50 MG PO TB24: 50.0000 mg | ORAL_TABLET | Freq: Every day | ORAL | Status: DC

## 2016-03-02 MED ORDER — ATORVASTATIN CALCIUM 40 MG PO TABS: 40.0000 mg | ORAL_TABLET | Freq: Every day | ORAL | Status: DC

## 2016-03-02 MED ORDER — AMLODIPINE BESYLATE 5 MG PO TABS: 5.0000 mg | ORAL_TABLET | Freq: Every day | ORAL | Status: DC

## 2016-03-02 MED ORDER — LISINOPRIL 10 MG PO TABS: 10.0000 mg | ORAL_TABLET | Freq: Every day | ORAL | Status: DC

## 2016-03-02 MED ORDER — HYDROCHLOROTHIAZIDE 25 MG PO TABS: 25.0000 mg | ORAL_TABLET | Freq: Every morning | ORAL | Status: DC

## 2016-03-02 NOTE — Progress Notes (Signed)
Name: Nicole Cook   MRN: [id number]    DOB: 1951/05/29   Date:03/02/2016       Progress Note  Subjective  Chief Complaint  Chief Complaint  Patient presents with  . Hypertension  . Hyperlipidemia  . Asthma  . Gastroesophageal Reflux  . Migraine    Hypertension This is a chronic problem. The current episode started more than 1 year ago. The problem has been gradually improving since onset. The problem is controlled. Pertinent negatives include no anxiety, blurred vision, chest pain, headaches, malaise/fatigue, neck pain, orthopnea, palpitations, peripheral edema, PND, shortness of breath or sweats. There are no associated agents to hypertension. There are no known risk factors for coronary artery disease. Past treatments include ACE inhibitors, calcium channel blockers, diuretics and beta blockers. The current treatment provides moderate improvement. There are no compliance problems.  There is no history of angina, kidney disease, CAD/MI, CVA, heart failure, left ventricular hypertrophy, PVD, renovascular disease or retinopathy. There is no history of chronic renal disease or a hypertension causing med.  Hyperlipidemia This is a chronic problem. The problem is controlled. She has no history of chronic renal disease. Pertinent negatives include no chest pain, focal sensory loss, focal weakness, leg pain, myalgias or shortness of breath. Current antihyperlipidemic treatment includes statins. The current treatment provides mild improvement of lipids. There are no compliance problems.   Asthma There is no chest tightness, cough, difficulty breathing, frequent throat clearing, hemoptysis, hoarse voice, shortness of breath, sputum production or wheezing. This is a chronic problem. The problem has been gradually improving. Pertinent negatives include no chest pain, ear pain, fever, headaches, heartburn, malaise/fatigue, myalgias, orthopnea, PND, sore throat, sweats or weight loss. Her symptoms are  alleviated by nothing. She reports moderate improvement on treatment. Her symptoms are not alleviated by beta-agonist. Her past medical history is significant for asthma.  Gastroesophageal Reflux She reports no abdominal pain, no chest pain, no coughing, no heartburn, no hoarse voice, no nausea, no sore throat or no wheezing. The problem occurs frequently. The problem has been gradually improving. The symptoms are aggravated by certain foods. Pertinent negatives include no melena, orthopnea or weight loss. There are no known risk factors. She has tried a PPI for the symptoms. The treatment provided mild relief. Past procedures do not include an abdominal ultrasound, an EGD, esophageal manometry, esophageal pH monitoring, H. pylori antibody titer or a UGI.  Migraine  This is a chronic problem. The problem occurs intermittently. The problem has been waxing and waning. Pertinent negatives include no abdominal pain, back pain, blurred vision, coughing, dizziness, ear pain, fever, insomnia, nausea, neck pain, sore throat, tingling or weight loss. She has tried triptans for the symptoms. The treatment provided mild relief. Her past medical history is significant for hypertension.    No problem-specific assessment & plan notes found for this encounter.   Past Medical History  Diagnosis Date  . Allergy   . Diabetes mellitus without complication ([place])   . GERD (gastroesophageal reflux disease)   . Migraine   . Hyperlipidemia   . Hypertension     Past Surgical History  Procedure Laterality Date  . Back surgery x 2    . C-section x 3    . Vaginal hysterectomy  12/14/1994  . Knee arthroscopy Right 2010  . Colonoscopy  2011    cleared for 5 yrs- Dr Beckey Downing    History reviewed. No pertinent family history.  Social History   Social History  . Marital Status: Married  Spouse Name: N/A  . Number of Children: N/A  . Years of Education: N/A   Occupational History  . Not on file.   Social  History Main Topics  . Smoking status: Never Smoker   . Smokeless tobacco: Not on file  . Alcohol Use: No  . Drug Use: No  . Sexual Activity: Yes   Other Topics Concern  . Not on file   Social History Narrative    Allergies  Allergen Reactions  . Compazine [Prochlorperazine]   . Penicillins   . Sulfa Antibiotics      Review of Systems  Constitutional: Negative for fever, chills, weight loss and malaise/fatigue.  HENT: Negative for ear discharge, ear pain, hoarse voice and sore throat.   Eyes: Negative for blurred vision.  Respiratory: Negative for cough, hemoptysis, sputum production, shortness of breath and wheezing.   Cardiovascular: Negative for chest pain, palpitations, orthopnea, leg swelling and PND.  Gastrointestinal: Negative for heartburn, nausea, abdominal pain, diarrhea, constipation, blood in stool and melena.  Genitourinary: Negative for dysuria, urgency, frequency and hematuria.  Musculoskeletal: Negative for myalgias, back pain, joint pain and neck pain.  Skin: Negative for rash.  Neurological: Negative for dizziness, tingling, sensory change, focal weakness and headaches.  Endo/Heme/Allergies: Negative for environmental allergies and polydipsia. Does not bruise/bleed easily.  Psychiatric/Behavioral: Negative for depression and suicidal ideas. The patient is not nervous/anxious and does not have insomnia.      Objective  Filed Vitals:   03/02/16 0923  BP: 140/80  Pulse: 70  Height: 5\' 5"  (1.651 m)  Weight: 181 lb (82.101 kg)    Physical Exam  Constitutional: She is well-developed, well-nourished, and in no distress. No distress.  HENT:  Head: Normocephalic and atraumatic.  Right Ear: Tympanic membrane, external ear and ear canal normal.  Left Ear: Tympanic membrane, external ear and ear canal normal.  Nose: Nose normal.  Mouth/Throat: Oropharynx is clear and moist.  Eyes: Conjunctivae and EOM are normal. Pupils are equal, round, and reactive to  light. Right eye exhibits no discharge. Left eye exhibits no discharge.  Neck: Normal range of motion. Neck supple. No JVD present. No thyromegaly present.  Cardiovascular: Normal rate, regular rhythm, normal heart sounds and intact distal pulses.  Exam reveals no gallop and no friction rub.   No murmur heard. Pulmonary/Chest: Effort normal and breath sounds normal.  Abdominal: Soft. Bowel sounds are normal. She exhibits no mass. There is no tenderness. There is no guarding.  Musculoskeletal: Normal range of motion. She exhibits no edema.  Lymphadenopathy:    She has no cervical adenopathy.  Neurological: She is alert.  Skin: Skin is warm and dry. She is not diaphoretic.  Psychiatric: Mood and affect normal.      Assessment & Plan  Problem List Items Addressed This Visit      Cardiovascular and Mediastinum   Essential (primary) hypertension - Primary   Relevant Medications   amLODipine (NORVASC) 5 MG tablet   hydrochlorothiazide (HYDRODIURIL) 25 MG tablet   lisinopril (PRINIVIL,ZESTRIL) 10 MG tablet   metoprolol succinate (TOPROL-XL) 50 MG 24 hr tablet   atorvastatin (LIPITOR) 40 MG tablet   Other Relevant Orders   Lipid Profile   Hepatic function panel     Other   Familial multiple lipoprotein-type hyperlipidemia   Relevant Medications   amLODipine (NORVASC) 5 MG tablet   hydrochlorothiazide (HYDRODIURIL) 25 MG tablet   lisinopril (PRINIVIL,ZESTRIL) 10 MG tablet   metoprolol succinate (TOPROL-XL) 50 MG 24 hr tablet   atorvastatin (  LIPITOR) 40 MG tablet   Other Relevant Orders   Lipid Profile   Cephalalgia   Relevant Medications   amLODipine (NORVASC) 5 MG tablet   metoprolol succinate (TOPROL-XL) 50 MG 24 hr tablet   SUMAtriptan (IMITREX) 50 MG tablet    Other Visit Diagnoses    Gastroesophageal reflux disease, esophagitis presence not specified        Relevant Medications    pantoprazole (PROTONIX) 40 MG tablet    Reactive airway disease, mild intermittent,  uncomplicated        Allergic rhinitis due to pollen, unspecified rhinitis seasonality        Relevant Medications    montelukast (SINGULAIR) 10 MG tablet    Essential hypertension        Relevant Medications    amLODipine (NORVASC) 5 MG tablet    hydrochlorothiazide (HYDRODIURIL) 25 MG tablet    lisinopril (PRINIVIL,ZESTRIL) 10 MG tablet    metoprolol succinate (TOPROL-XL) 50 MG 24 hr tablet    atorvastatin (LIPITOR) 40 MG tablet    Hyperlipidemia        Relevant Medications    amLODipine (NORVASC) 5 MG tablet    hydrochlorothiazide (HYDRODIURIL) 25 MG tablet    lisinopril (PRINIVIL,ZESTRIL) 10 MG tablet    metoprolol succinate (TOPROL-XL) 50 MG 24 hr tablet    atorvastatin (LIPITOR) 40 MG tablet    Migraine with aura and without status migrainosus, not intractable        Relevant Medications    amLODipine (NORVASC) 5 MG tablet    hydrochlorothiazide (HYDRODIURIL) 25 MG tablet    lisinopril (PRINIVIL,ZESTRIL) 10 MG tablet    metoprolol succinate (TOPROL-XL) 50 MG 24 hr tablet    atorvastatin (LIPITOR) 40 MG tablet    SUMAtriptan (IMITREX) 50 MG tablet    Need for hepatitis C screening test        Relevant Orders    Hepatitis C antibody    Taking drug for chronic disease        Relevant Orders    Hepatic function panel         Dr. Deanna Jones [facility]  03/02/2016

## 2016-03-03 DIAGNOSIS — E538 Deficiency of other specified B group vitamins: Secondary | ICD-10-CM | POA: Diagnosis not present

## 2016-03-03 DIAGNOSIS — Z794 Long term (current) use of insulin: Secondary | ICD-10-CM | POA: Diagnosis not present

## 2016-03-03 DIAGNOSIS — E1165 Type 2 diabetes mellitus with hyperglycemia: Secondary | ICD-10-CM | POA: Diagnosis not present

## 2016-03-03 DIAGNOSIS — E785 Hyperlipidemia, unspecified: Secondary | ICD-10-CM | POA: Diagnosis not present

## 2016-03-03 LAB — HEPATIC FUNCTION PANEL
ALT: 25 IU/L (ref 0–32)
AST: 21 IU/L (ref 0–40)
Albumin: 4.6 g/dL (ref 3.6–4.8)
Alkaline Phosphatase: 53 IU/L (ref 39–117)
Bilirubin Total: 0.4 mg/dL (ref 0.0–1.2)
Bilirubin, Direct: 0.09 mg/dL (ref 0.00–0.40)
Total Protein: 7.1 g/dL (ref 6.0–8.5)

## 2016-03-03 LAB — HCV ANTIBODY: Hep C Virus Ab: <0.1 s/co ratio (ref 0.0–0.9)

## 2016-03-03 LAB — LIPID PANEL
Chol/HDL Ratio: 3.7 ratio units (ref 0.0–4.4)
Cholesterol, Total: 159 mg/dL (ref 100–199)
HDL: 43 mg/dL (ref >39)
LDL Calculated: 86 mg/dL (ref 0–99)
Triglycerides: 149 mg/dL (ref 0–149)
VLDL Cholesterol Cal: 30 mg/dL (ref 5–40)

## 2016-03-18 DIAGNOSIS — E119 Type 2 diabetes mellitus without complications: Secondary | ICD-10-CM | POA: Diagnosis not present

## 2016-03-25 ENCOUNTER — Ambulatory Visit
Admission: RE | Admit: 2016-03-25 | Discharge: 2016-03-25 | Disposition: A | Discharge status: Home / Self Care | Payer: Medicare Other | Source: Ambulatory Visit | Attending: Family Medicine | Admitting: Family Medicine

## 2016-03-25 DIAGNOSIS — Z1231 Encounter for screening mammogram for malignant neoplasm of breast: Secondary | ICD-10-CM | POA: Insufficient documentation

## 2016-06-02 DIAGNOSIS — E1165 Type 2 diabetes mellitus with hyperglycemia: Secondary | ICD-10-CM | POA: Diagnosis not present

## 2016-06-02 DIAGNOSIS — E538 Deficiency of other specified B group vitamins: Secondary | ICD-10-CM | POA: Diagnosis not present

## 2016-06-02 DIAGNOSIS — Z794 Long term (current) use of insulin: Secondary | ICD-10-CM | POA: Diagnosis not present

## 2016-06-09 DIAGNOSIS — E538 Deficiency of other specified B group vitamins: Secondary | ICD-10-CM | POA: Diagnosis not present

## 2016-06-09 DIAGNOSIS — Z794 Long term (current) use of insulin: Secondary | ICD-10-CM | POA: Diagnosis not present

## 2016-06-09 DIAGNOSIS — E1165 Type 2 diabetes mellitus with hyperglycemia: Secondary | ICD-10-CM | POA: Diagnosis not present

## 2016-06-09 DIAGNOSIS — E785 Hyperlipidemia, unspecified: Secondary | ICD-10-CM | POA: Diagnosis not present

## 2016-06-12 DIAGNOSIS — Z794 Long term (current) use of insulin: Secondary | ICD-10-CM | POA: Diagnosis not present

## 2016-06-12 DIAGNOSIS — E1165 Type 2 diabetes mellitus with hyperglycemia: Secondary | ICD-10-CM | POA: Diagnosis not present

## 2016-06-13 LAB — HEMOGLOBIN A1C: Hemoglobin A1C: 7.5

## 2016-06-19 DIAGNOSIS — E785 Hyperlipidemia, unspecified: Secondary | ICD-10-CM | POA: Diagnosis not present

## 2016-06-19 DIAGNOSIS — E538 Deficiency of other specified B group vitamins: Secondary | ICD-10-CM | POA: Diagnosis not present

## 2016-06-19 DIAGNOSIS — E1165 Type 2 diabetes mellitus with hyperglycemia: Secondary | ICD-10-CM | POA: Diagnosis not present

## 2016-06-19 DIAGNOSIS — Z794 Long term (current) use of insulin: Secondary | ICD-10-CM | POA: Diagnosis not present

## 2016-07-16 ENCOUNTER — Other Ambulatory Visit: Payer: Self-pay | Admitting: Family Medicine

## 2016-07-17 ENCOUNTER — Other Ambulatory Visit: Payer: Self-pay

## 2016-09-01 ENCOUNTER — Other Ambulatory Visit: Payer: Self-pay | Admitting: Family Medicine

## 2016-09-01 DIAGNOSIS — I10 Essential (primary) hypertension: Secondary | ICD-10-CM

## 2016-09-01 DIAGNOSIS — J301 Allergic rhinitis due to pollen: Secondary | ICD-10-CM

## 2016-09-02 ENCOUNTER — Encounter: Payer: Self-pay | Admitting: Family Medicine

## 2016-09-02 ENCOUNTER — Ambulatory Visit (INDEPENDENT_AMBULATORY_CARE_PROVIDER_SITE_OTHER): Payer: Medicare Other | Admitting: Family Medicine

## 2016-09-02 VITALS — BP 124/72 | HR 70 | Ht 65.0 in | Wt 174.0 lb

## 2016-09-02 DIAGNOSIS — Z8669 Personal history of other diseases of the nervous system and sense organs: Secondary | ICD-10-CM

## 2016-09-02 DIAGNOSIS — Z23 Encounter for immunization: Secondary | ICD-10-CM

## 2016-09-02 DIAGNOSIS — G43109 Migraine with aura, not intractable, without status migrainosus: Secondary | ICD-10-CM

## 2016-09-02 DIAGNOSIS — E784 Other hyperlipidemia: Secondary | ICD-10-CM | POA: Diagnosis not present

## 2016-09-02 DIAGNOSIS — I10 Essential (primary) hypertension: Secondary | ICD-10-CM

## 2016-09-02 DIAGNOSIS — Z13820 Encounter for screening for osteoporosis: Secondary | ICD-10-CM | POA: Diagnosis not present

## 2016-09-02 DIAGNOSIS — E119 Type 2 diabetes mellitus without complications: Secondary | ICD-10-CM | POA: Diagnosis not present

## 2016-09-02 DIAGNOSIS — M5136 Other intervertebral disc degeneration, lumbar region: Secondary | ICD-10-CM | POA: Diagnosis not present

## 2016-09-02 DIAGNOSIS — E785 Hyperlipidemia, unspecified: Secondary | ICD-10-CM

## 2016-09-02 DIAGNOSIS — E7849 Other hyperlipidemia: Secondary | ICD-10-CM

## 2016-09-02 DIAGNOSIS — M51369 Other intervertebral disc degeneration, lumbar region without mention of lumbar back pain or lower extremity pain: Secondary | ICD-10-CM

## 2016-09-02 DIAGNOSIS — M8580 Other specified disorders of bone density and structure, unspecified site: Secondary | ICD-10-CM | POA: Diagnosis not present

## 2016-09-02 DIAGNOSIS — J301 Allergic rhinitis due to pollen: Secondary | ICD-10-CM | POA: Diagnosis not present

## 2016-09-02 DIAGNOSIS — K219 Gastro-esophageal reflux disease without esophagitis: Secondary | ICD-10-CM

## 2016-09-02 MED ORDER — HYDROCHLOROTHIAZIDE 25 MG PO TABS: 25.0000 mg | ORAL_TABLET | Freq: Every morning | ORAL | 1 refills | Status: DC

## 2016-09-02 MED ORDER — MONTELUKAST SODIUM 10 MG PO TABS: 10.0000 mg | ORAL_TABLET | Freq: Every day | ORAL | 1 refills | Status: DC

## 2016-09-02 MED ORDER — ATORVASTATIN CALCIUM 40 MG PO TABS: 40.0000 mg | ORAL_TABLET | Freq: Every day | ORAL | 1 refills | Status: DC

## 2016-09-02 MED ORDER — LISINOPRIL 10 MG PO TABS: 10.0000 mg | ORAL_TABLET | Freq: Every day | ORAL | 1 refills | Status: DC

## 2016-09-02 MED ORDER — METOPROLOL SUCCINATE ER 50 MG PO TB24: 50.0000 mg | ORAL_TABLET | Freq: Every day | ORAL | 1 refills | Status: DC

## 2016-09-02 MED ORDER — AMLODIPINE BESYLATE 5 MG PO TABS: 5.0000 mg | ORAL_TABLET | Freq: Every day | ORAL | 1 refills | Status: DC

## 2016-09-02 MED ORDER — PANTOPRAZOLE SODIUM 40 MG PO TBEC: 40.0000 mg | DELAYED_RELEASE_TABLET | Freq: Every day | ORAL | 1 refills | Status: DC

## 2016-09-02 MED ORDER — SUMATRIPTAN SUCCINATE 50 MG PO TABS: 50.0000 mg | ORAL_TABLET | ORAL | 2 refills | Status: DC | PRN

## 2016-09-02 MED ORDER — FLUTICASONE PROPIONATE 50 MCG/ACT NA SUSP: 2.0000 | Freq: Every day | NASAL | 1 refills | Status: DC

## 2016-09-02 NOTE — Progress Notes (Signed)
Name: Nicole Cook   MRN: [id number]    DOB: 1951-05-01   Date:09/02/2016       Progress Note  Subjective  Chief Complaint  Chief Complaint  Patient presents with  . Hypertension  . Allergic Rhinitis   . Hyperlipidemia  . Gastroesophageal Reflux  . Headache    Hypertension  This is a chronic problem. The current episode started more than 1 year ago. The problem has been gradually improving since onset. The problem is controlled. Associated symptoms include headaches. Pertinent negatives include no anxiety, blurred vision, chest pain, malaise/fatigue, neck pain, orthopnea, palpitations, peripheral edema, PND, shortness of breath or sweats. There are no associated agents to hypertension. There are no known risk factors for coronary artery disease. Past treatments include beta blockers, diuretics and ACE inhibitors. The current treatment provides moderate improvement. Hypertensive end-organ damage includes CAD/MI. There is no history of angina, kidney disease, CVA, heart failure, left ventricular hypertrophy, PVD, renovascular disease or retinopathy. There is no history of chronic renal disease or a hypertension causing med.  Hyperlipidemia  This is a chronic problem. The problem is controlled. Recent lipid tests were reviewed and are normal. She has no history of chronic renal disease. Factors aggravating her hyperlipidemia include thiazides and beta blockers. Pertinent negatives include no chest pain, focal weakness, myalgias or shortness of breath. The current treatment provides moderate improvement of lipids. There are no compliance problems.  Risk factors for coronary artery disease include diabetes mellitus, dyslipidemia and hypertension.  Gastroesophageal Reflux  She complains of heartburn. She reports no abdominal pain, no chest pain, no coughing, no dysphagia, no early satiety, no nausea, no sore throat or no wheezing. This is a chronic problem. The current episode started more than 1  year ago. The problem occurs occasionally. The problem has been waxing and waning. The heartburn is of mild intensity. The symptoms are aggravated by certain foods. Associated symptoms include weight loss. Pertinent negatives include no anemia, fatigue, melena or muscle weakness. She has tried a PPI for the symptoms. The treatment provided moderate relief.  Headache   This is a recurrent problem. The problem occurs intermittently. The problem has been waxing and waning. Associated symptoms include weight loss. Pertinent negatives include no abdominal pain, back pain, blurred vision, coughing, dizziness, ear pain, fever, insomnia, nausea, neck pain, sinus pressure, sore throat, tingling or visual change. Her past medical history is significant for hypertension.  Diabetes  She presents for her follow-up diabetic visit. She has type 2 diabetes mellitus. Hypoglycemia symptoms include headaches. Pertinent negatives for hypoglycemia include no dizziness, nervousness/anxiousness or sweats. Associated symptoms include weight loss. Pertinent negatives for diabetes include no blurred vision, no chest pain, no fatigue, no foot paresthesias, no polydipsia, no polyuria and no visual change. Pertinent negatives for diabetic complications include no CVA, PVD or retinopathy. Current diabetic treatments: trulicity/invokana/ metformin.    No problem-specific Assessment & Plan notes found for this encounter.   Past Medical History:  Diagnosis Date  . Allergy   . Diabetes mellitus without complication ([place])   . GERD (gastroesophageal reflux disease)   . Hyperlipidemia   . Hypertension   . Migraine     Past Surgical History:  Procedure Laterality Date  . back surgery x 2    . c-section x 3    . COLONOSCOPY  2011   cleared for 5 yrs- Dr Beckey Downing  . KNEE ARTHROSCOPY Right 2010  . VAGINAL HYSTERECTOMY  12/14/1994    Family History  Problem Relation Age  of Onset  . Breast cancer Mother 59  . Breast cancer  Sister 73  . Breast cancer Paternal Aunt     Social History   Social History  . Marital status: Married    Spouse name: N/A  . Number of children: N/A  . Years of education: N/A   Occupational History  . Not on file.   Social History Main Topics  . Smoking status: Never Smoker  . Smokeless tobacco: Not on file  . Alcohol use No  . Drug use: No  . Sexual activity: Yes   Other Topics Concern  . Not on file   Social History Narrative  . No narrative on file    Allergies  Allergen Reactions  . Compazine [Prochlorperazine]   . Penicillins   . Sulfa Antibiotics      Review of Systems  Constitutional: Positive for weight loss. Negative for chills, fatigue, fever and malaise/fatigue.  HENT: Negative for ear discharge, ear pain, sinus pressure and sore throat.   Eyes: Negative for blurred vision.  Respiratory: Negative for cough, sputum production, shortness of breath and wheezing.   Cardiovascular: Negative for chest pain, palpitations, orthopnea, leg swelling and PND.  Gastrointestinal: Positive for heartburn. Negative for abdominal pain, blood in stool, constipation, diarrhea, dysphagia, melena and nausea.  Genitourinary: Negative for dysuria, frequency, hematuria and urgency.  Musculoskeletal: Negative for back pain, joint pain, myalgias, muscle weakness and neck pain.  Skin: Negative for rash.  Neurological: Positive for headaches. Negative for dizziness, tingling, sensory change and focal weakness.  Endo/Heme/Allergies: Negative for environmental allergies and polydipsia. Does not bruise/bleed easily.  Psychiatric/Behavioral: Negative for depression and suicidal ideas. The patient is not nervous/anxious and does not have insomnia.      Objective  Vitals:   09/02/16 1004  BP: 124/72  Pulse: 70  Weight: 174 lb (78.9 kg)  Height: 5\' 5"  (1.651 m)    Physical Exam  Constitutional: She is well-developed, well-nourished, and in no distress. No distress.  HENT:   Head: Normocephalic and atraumatic.  Right Ear: External ear normal.  Left Ear: External ear normal.  Nose: Nose normal.  Mouth/Throat: Oropharynx is clear and moist.  Eyes: Conjunctivae and EOM are normal. Pupils are equal, round, and reactive to light. Right eye exhibits no discharge. Left eye exhibits no discharge.  Neck: Normal range of motion. Neck supple. No JVD present. No thyromegaly present.  Cardiovascular: Normal rate, regular rhythm, normal heart sounds and intact distal pulses.  Exam reveals no gallop and no friction rub.   No murmur heard. Pulmonary/Chest: Effort normal and breath sounds normal.  Abdominal: Soft. Bowel sounds are normal. She exhibits no mass. There is no tenderness. There is no guarding.  Musculoskeletal: Normal range of motion. She exhibits no edema.  Lymphadenopathy:    She has no cervical adenopathy.  Neurological: She is alert.  Skin: Skin is warm and dry. She is not diaphoretic.  Psychiatric: Mood and affect normal.  Nursing note and vitals reviewed.     Assessment & Plan  Problem List Items Addressed This Visit      Cardiovascular and Mediastinum   Essential (primary) hypertension   Relevant Medications   amLODipine (NORVASC) 5 MG tablet   hydrochlorothiazide (HYDRODIURIL) 25 MG tablet   lisinopril (PRINIVIL,ZESTRIL) 10 MG tablet   metoprolol succinate (TOPROL-XL) 50 MG 24 hr tablet   atorvastatin (LIPITOR) 40 MG tablet   Other Relevant Orders   Renal Function Panel     Endocrine   Diabetes mellitus  with no complication (HCC)   Relevant Medications   Dulaglutide (TRULICITY) [id number] [id number] SOPN   lisinopril (PRINIVIL,ZESTRIL) 10 MG tablet   atorvastatin (LIPITOR) 40 MG tablet   Other Relevant Orders   Renal Function Panel   Lipid Profile   Hemoglobin A1c     Other   Familial multiple lipoprotein-type hyperlipidemia   Relevant Medications   amLODipine (NORVASC) 5 MG tablet   hydrochlorothiazide (HYDRODIURIL) 25 MG tablet    lisinopril (PRINIVIL,ZESTRIL) 10 MG tablet   metoprolol succinate (TOPROL-XL) 50 MG 24 hr tablet   atorvastatin (LIPITOR) 40 MG tablet   Other Relevant Orders   Lipid Profile    Other Visit Diagnoses    Degenerative disc disease, lumbar    -  Primary   Gastroesophageal reflux disease, esophagitis presence not specified       Relevant Medications   pantoprazole (PROTONIX) 40 MG tablet   History of migraine       Seasonal allergic rhinitis due to pollen       Relevant Medications   fluticasone (FLONASE) 50 MCG/ACT nasal spray   Allergic rhinitis due to pollen, unspecified rhinitis seasonality       Relevant Medications   montelukast (SINGULAIR) 10 MG tablet   Essential hypertension       Relevant Medications   amLODipine (NORVASC) 5 MG tablet   hydrochlorothiazide (HYDRODIURIL) 25 MG tablet   lisinopril (PRINIVIL,ZESTRIL) 10 MG tablet   metoprolol succinate (TOPROL-XL) 50 MG 24 hr tablet   atorvastatin (LIPITOR) 40 MG tablet   Hyperlipidemia       Relevant Medications   amLODipine (NORVASC) 5 MG tablet   hydrochlorothiazide (HYDRODIURIL) 25 MG tablet   lisinopril (PRINIVIL,ZESTRIL) 10 MG tablet   metoprolol succinate (TOPROL-XL) 50 MG 24 hr tablet   atorvastatin (LIPITOR) 40 MG tablet   Migraine with aura and without status migrainosus, not intractable       Relevant Medications   amLODipine (NORVASC) 5 MG tablet   hydrochlorothiazide (HYDRODIURIL) 25 MG tablet   lisinopril (PRINIVIL,ZESTRIL) 10 MG tablet   metoprolol succinate (TOPROL-XL) 50 MG 24 hr tablet   atorvastatin (LIPITOR) 40 MG tablet   SUMAtriptan (IMITREX) 50 MG tablet   Osteopenia       Osteoporosis screening       Flu vaccine need       Relevant Orders   Flu Vaccine QUAD 36+ mos PF IM (Fluarix & Fluzone Quad PF) (Completed)     More than 50 minutes was spent with patient discussing insurance, meds, bone density, etc   Dr. [facility]  09/02/16

## 2016-09-03 LAB — RENAL FUNCTION PANEL
Albumin: 5 g/dL — ABNORMAL HIGH (ref 3.6–4.8)
BUN/Creatinine Ratio: 17 (ref 12–28)
BUN: 14 mg/dL (ref 8–27)
CO2: 27 mmol/L (ref 18–29)
Calcium: 10.4 mg/dL — ABNORMAL HIGH (ref 8.7–10.3)
Chloride: 94 mmol/L — ABNORMAL LOW (ref 96–106)
Creatinine, Ser: 0.83 mg/dL (ref 0.57–1.00)
GFR calc Af Amer: 84 mL/min/{1.73_m2} (ref >59)
GFR calc non Af Amer: 73 mL/min/{1.73_m2} (ref >59)
Glucose: 118 mg/dL — ABNORMAL HIGH (ref 65–99)
Phosphorus: 3.8 mg/dL (ref 2.5–4.5)
Potassium: 4.8 mmol/L (ref 3.5–5.2)
Sodium: 138 mmol/L (ref 134–144)

## 2016-09-03 LAB — HEMOGLOBIN A1C
Est. average glucose Bld gHb Est-mCnc: 146 mg/dL
Hgb A1c MFr Bld: 6.7 % — ABNORMAL HIGH (ref 4.8–5.6)

## 2016-09-03 LAB — HEPATIC FUNCTION PANEL
ALT: 23 IU/L (ref 0–32)
AST: 18 IU/L (ref 0–40)
Alkaline Phosphatase: 56 IU/L (ref 39–117)
Bilirubin Total: 0.5 mg/dL (ref 0.0–1.2)
Bilirubin, Direct: 0.14 mg/dL (ref 0.00–0.40)
Total Protein: 7.7 g/dL (ref 6.0–8.5)

## 2016-09-03 LAB — LIPID PANEL
Chol/HDL Ratio: 4 ratio units (ref 0.0–4.4)
Cholesterol, Total: 155 mg/dL (ref 100–199)
HDL: 39 mg/dL — ABNORMAL LOW (ref >39)
LDL Calculated: 89 mg/dL (ref 0–99)
Triglycerides: 135 mg/dL (ref 0–149)
VLDL Cholesterol Cal: 27 mg/dL (ref 5–40)

## 2016-09-17 DIAGNOSIS — Z794 Long term (current) use of insulin: Secondary | ICD-10-CM | POA: Diagnosis not present

## 2016-09-17 DIAGNOSIS — R110 Nausea: Secondary | ICD-10-CM | POA: Diagnosis not present

## 2016-09-17 DIAGNOSIS — E119 Type 2 diabetes mellitus without complications: Secondary | ICD-10-CM | POA: Diagnosis not present

## 2016-09-17 DIAGNOSIS — E785 Hyperlipidemia, unspecified: Secondary | ICD-10-CM | POA: Diagnosis not present

## 2016-09-17 DIAGNOSIS — E538 Deficiency of other specified B group vitamins: Secondary | ICD-10-CM | POA: Diagnosis not present

## 2016-09-23 DIAGNOSIS — L812 Freckles: Secondary | ICD-10-CM | POA: Diagnosis not present

## 2016-09-23 DIAGNOSIS — L821 Other seborrheic keratosis: Secondary | ICD-10-CM | POA: Diagnosis not present

## 2016-09-23 DIAGNOSIS — Z808 Family history of malignant neoplasm of other organs or systems: Secondary | ICD-10-CM | POA: Diagnosis not present

## 2016-09-23 DIAGNOSIS — D485 Neoplasm of uncertain behavior of skin: Secondary | ICD-10-CM | POA: Diagnosis not present

## 2016-09-23 DIAGNOSIS — D1801 Hemangioma of skin and subcutaneous tissue: Secondary | ICD-10-CM | POA: Diagnosis not present

## 2016-09-23 DIAGNOSIS — D225 Melanocytic nevi of trunk: Secondary | ICD-10-CM | POA: Diagnosis not present

## 2016-09-23 DIAGNOSIS — Z1283 Encounter for screening for malignant neoplasm of skin: Secondary | ICD-10-CM | POA: Diagnosis not present

## 2016-10-16 DIAGNOSIS — C4441 Basal cell carcinoma of skin of scalp and neck: Secondary | ICD-10-CM | POA: Diagnosis not present

## 2016-10-28 ENCOUNTER — Other Ambulatory Visit: Payer: Self-pay | Admitting: Obstetrics and Gynecology

## 2016-10-28 DIAGNOSIS — B373 Candidiasis of vulva and vagina: Secondary | ICD-10-CM | POA: Diagnosis not present

## 2016-10-28 DIAGNOSIS — N9489 Other specified conditions associated with female genital organs and menstrual cycle: Secondary | ICD-10-CM | POA: Diagnosis not present

## 2016-10-28 DIAGNOSIS — N952 Postmenopausal atrophic vaginitis: Secondary | ICD-10-CM | POA: Diagnosis not present

## 2016-10-28 DIAGNOSIS — Z1231 Encounter for screening mammogram for malignant neoplasm of breast: Secondary | ICD-10-CM | POA: Diagnosis not present

## 2016-10-28 DIAGNOSIS — Z6829 Body mass index (BMI) 29.0-29.9, adult: Secondary | ICD-10-CM | POA: Diagnosis not present

## 2016-10-28 DIAGNOSIS — Z01419 Encounter for gynecological examination (general) (routine) without abnormal findings: Secondary | ICD-10-CM | POA: Diagnosis not present

## 2016-10-29 DIAGNOSIS — G8929 Other chronic pain: Secondary | ICD-10-CM | POA: Diagnosis not present

## 2016-10-29 DIAGNOSIS — M1611 Unilateral primary osteoarthritis, right hip: Secondary | ICD-10-CM | POA: Diagnosis not present

## 2016-10-29 DIAGNOSIS — M545 Low back pain: Secondary | ICD-10-CM

## 2016-11-09 DIAGNOSIS — I2510 Atherosclerotic heart disease of native coronary artery without angina pectoris: Secondary | ICD-10-CM | POA: Diagnosis not present

## 2016-11-09 DIAGNOSIS — I10 Essential (primary) hypertension: Secondary | ICD-10-CM | POA: Diagnosis not present

## 2016-11-09 DIAGNOSIS — E7800 Pure hypercholesterolemia, unspecified: Secondary | ICD-10-CM | POA: Diagnosis not present

## 2016-11-09 DIAGNOSIS — I209 Angina pectoris, unspecified: Secondary | ICD-10-CM | POA: Insufficient documentation

## 2016-11-09 DIAGNOSIS — I2583 Coronary atherosclerosis due to lipid rich plaque: Secondary | ICD-10-CM | POA: Diagnosis not present

## 2016-11-16 DIAGNOSIS — I2583 Coronary atherosclerosis due to lipid rich plaque: Secondary | ICD-10-CM | POA: Diagnosis not present

## 2016-11-16 DIAGNOSIS — I209 Angina pectoris, unspecified: Secondary | ICD-10-CM | POA: Diagnosis not present

## 2016-11-16 DIAGNOSIS — I2510 Atherosclerotic heart disease of native coronary artery without angina pectoris: Secondary | ICD-10-CM | POA: Diagnosis not present

## 2016-11-17 DIAGNOSIS — Z01812 Encounter for preprocedural laboratory examination: Secondary | ICD-10-CM | POA: Diagnosis not present

## 2016-11-17 DIAGNOSIS — Z79899 Other long term (current) drug therapy: Secondary | ICD-10-CM | POA: Diagnosis not present

## 2016-11-17 DIAGNOSIS — Z5181 Encounter for therapeutic drug level monitoring: Secondary | ICD-10-CM | POA: Diagnosis not present

## 2016-11-18 DIAGNOSIS — Z79899 Other long term (current) drug therapy: Secondary | ICD-10-CM | POA: Diagnosis not present

## 2016-11-18 DIAGNOSIS — I25110 Atherosclerotic heart disease of native coronary artery with unstable angina pectoris: Secondary | ICD-10-CM | POA: Diagnosis not present

## 2016-11-18 DIAGNOSIS — R9430 Abnormal result of cardiovascular function study, unspecified: Secondary | ICD-10-CM | POA: Diagnosis not present

## 2016-11-20 DIAGNOSIS — I209 Angina pectoris, unspecified: Secondary | ICD-10-CM | POA: Diagnosis not present

## 2017-03-02 ENCOUNTER — Encounter: Payer: Self-pay | Admitting: Family Medicine

## 2017-03-02 ENCOUNTER — Ambulatory Visit (INDEPENDENT_AMBULATORY_CARE_PROVIDER_SITE_OTHER): Payer: Medicare Other | Admitting: Family Medicine

## 2017-03-02 VITALS — BP 110/80 | HR 64 | Ht 65.0 in | Wt 164.0 lb

## 2017-03-02 DIAGNOSIS — Z780 Asymptomatic menopausal state: Secondary | ICD-10-CM

## 2017-03-02 DIAGNOSIS — E784 Other hyperlipidemia: Secondary | ICD-10-CM | POA: Diagnosis not present

## 2017-03-02 DIAGNOSIS — I10 Essential (primary) hypertension: Secondary | ICD-10-CM

## 2017-03-02 DIAGNOSIS — E119 Type 2 diabetes mellitus without complications: Secondary | ICD-10-CM | POA: Diagnosis not present

## 2017-03-02 DIAGNOSIS — J301 Allergic rhinitis due to pollen: Secondary | ICD-10-CM

## 2017-03-02 DIAGNOSIS — K219 Gastro-esophageal reflux disease without esophagitis: Secondary | ICD-10-CM | POA: Diagnosis not present

## 2017-03-02 DIAGNOSIS — R69 Illness, unspecified: Secondary | ICD-10-CM | POA: Insufficient documentation

## 2017-03-02 DIAGNOSIS — Z23 Encounter for immunization: Secondary | ICD-10-CM

## 2017-03-02 DIAGNOSIS — E7849 Other hyperlipidemia: Secondary | ICD-10-CM

## 2017-03-02 DIAGNOSIS — G43109 Migraine with aura, not intractable, without status migrainosus: Secondary | ICD-10-CM | POA: Diagnosis not present

## 2017-03-02 MED ORDER — FLUTICASONE PROPIONATE 50 MCG/ACT NA SUSP: 2.0000 | Freq: Every day | NASAL | 1 refills | Status: DC

## 2017-03-02 MED ORDER — PANTOPRAZOLE SODIUM 40 MG PO TBEC: 40.0000 mg | DELAYED_RELEASE_TABLET | Freq: Every day | ORAL | 1 refills | Status: DC

## 2017-03-02 MED ORDER — AMLODIPINE BESYLATE 5 MG PO TABS: 5.0000 mg | ORAL_TABLET | Freq: Every day | ORAL | 1 refills | Status: DC

## 2017-03-02 MED ORDER — HYDROCHLOROTHIAZIDE 25 MG PO TABS: 25.0000 mg | ORAL_TABLET | Freq: Every morning | ORAL | 1 refills | Status: DC

## 2017-03-02 MED ORDER — SUMATRIPTAN SUCCINATE 50 MG PO TABS: 50.0000 mg | ORAL_TABLET | ORAL | 2 refills | Status: DC | PRN

## 2017-03-02 MED ORDER — LISINOPRIL 10 MG PO TABS: 10.0000 mg | ORAL_TABLET | Freq: Every day | ORAL | 1 refills | Status: DC

## 2017-03-02 MED ORDER — MONTELUKAST SODIUM 10 MG PO TABS
10.0000 mg | ORAL_TABLET | Freq: Every day | ORAL | 1 refills | Status: DC
Start: 2017-03-02 — End: 2017-09-06

## 2017-03-02 NOTE — Progress Notes (Signed)
Name: Nicole Cook   MRN: [id number]    DOB: [birth year]   Date:03/02/2017       Progress Note  Subjective  Chief Complaint  Chief Complaint  Patient presents with  . Hypertension  . Allergic Rhinitis   . Gastroesophageal Reflux  . Migraine    refill imitrex    Hypertension  This is a chronic problem. The current episode started more than 1 year ago. The problem has been waxing and waning since onset. The problem is controlled. Associated symptoms include blurred vision and headaches. Pertinent negatives include no anxiety, chest pain, malaise/fatigue, neck pain, orthopnea, palpitations, peripheral edema, PND, shortness of breath or sweats. There are no associated agents to hypertension. There are no known risk factors for coronary artery disease. Past treatments include ACE inhibitors, calcium channel blockers, beta blockers and diuretics. The current treatment provides moderate improvement. There are no compliance problems.  Hypertensive end-organ damage includes CAD/MI. There is no history of angina, kidney disease, CVA, heart failure, left ventricular hypertrophy, PVD or retinopathy. There is no history of chronic renal disease, a hypertension causing med or renovascular disease.  Gastroesophageal Reflux  She complains of heartburn. She reports no abdominal pain, no belching, no chest pain, no choking, no coughing, no dysphagia, no early satiety, no hoarse voice, no nausea, no sore throat, no stridor or no wheezing. This is a chronic problem. The problem has been waxing and waning. The heartburn is of mild intensity. The symptoms are aggravated by certain foods. Pertinent negatives include no anemia, fatigue, melena, muscle weakness, orthopnea or weight loss. She has tried a PPI for the symptoms. The treatment provided moderate relief.  Migraine   This is a chronic problem. The current episode started more than 1 year ago. The problem occurs intermittently. The problem has been unchanged.  The pain is located in the retro-orbital region. The pain does not radiate. The quality of the pain is described as throbbing. Associated symptoms include blurred vision. Pertinent negatives include no abdominal pain, back pain, coughing, dizziness, ear pain, fever, insomnia, nausea, neck pain, sore throat, tingling or weight loss. Associated symptoms comments: Visual aura. The symptoms are aggravated by bright light and noise. She has tried triptans for the symptoms. The treatment provided moderate relief. Her past medical history is significant for hypertension. There is no history of cancer or cluster headaches.    No problem-specific Assessment & Plan notes found for this encounter.   Past Medical History:  Diagnosis Date  . Allergy   . Diabetes mellitus without complication ([place])   . GERD (gastroesophageal reflux disease)   . Hyperlipidemia   . Hypertension   . Migraine     Past Surgical History:  Procedure Laterality Date  . back surgery x 2    . c-section x 3    . COLONOSCOPY  2011   cleared for 5 yrs- Dr Beckey Downing  . KNEE ARTHROSCOPY Right 2010  . VAGINAL HYSTERECTOMY  12/14/1994    Family History  Problem Relation Age of Onset  . Breast cancer Mother 9  . Breast cancer Sister 67  . Breast cancer Paternal Aunt     Social History   Social History  . Marital status: Married    Spouse name: N/A  . Number of children: N/A  . Years of education: N/A   Occupational History  . Not on file.   Social History Main Topics  . Smoking status: Never Smoker  . Smokeless tobacco: Never Used  . Alcohol use  No  . Drug use: No  . Sexual activity: Yes   Other Topics Concern  . Not on file   Social History Narrative  . No narrative on file    Allergies  Allergen Reactions  . Compazine [Prochlorperazine]   . Penicillins   . Sulfa Antibiotics     Outpatient Medications Prior to Visit  Medication Sig Dispense Refill  . aspirin 81 MG tablet Take 1 tablet by mouth  daily.    Marland Kitchen BLOOD GLUCOSE MONITORING SUPPL 1 application daily.    . canagliflozin (INVOKANA) 100 MG TABS tablet Take 100 mg by mouth daily. Dr Eddie Dibbles    . Cholecalciferol (VITAMIN D3) 1000 units CAPS Take 1 capsule by mouth daily.    . Coenzyme Q10 (EQL COQ10) 300 MG CAPS Take 1 capsule by mouth daily.    . Dulaglutide (TRULICITY) 1.32 GM/0.1UU SOPN Inject into the skin. Dr Eddie Dibbles    . estradiol (ESTRING) 2 MG vaginal ring Place 2 mg vaginally every 3 (three) months. follow package directions- GYN    . gabapentin (NEURONTIN) 300 MG capsule Take 2 capsules by mouth 2 (two) times daily. [facility]- may take 3 capsules 3 times per day per pt    . HYDROcodone-acetaminophen (NORCO) 10-325 MG per tablet Take 1 tablet by mouth as needed. [facility]    . Lancets (ACCU-CHEK MULTICLIX) lancets USE 1 LANCET DAILY 102 each 0  . Lido-Capsaicin-Men-Methyl Sal 0.5-0.035-5-20 % PTCH Apply 1 application topically as needed. [facility]    . Magnesium 500 MG CAPS Take 1 capsule by mouth daily.    . metFORMIN (GLUCOPHAGE) 500 MG tablet Take 1,000 mg by mouth 2 (two) times daily. Dr Eddie Dibbles    . Multiple Vitamins-Minerals (CENTRUM SILVER ULTRA WOMENS PO) Take 1 tablet by mouth daily.    . nitroGLYCERIN (NITROSTAT) 0.4 MG SL tablet Place 1 tablet under the tongue as needed.    Marland Kitchen tiZANidine (ZANAFLEX) 4 MG capsule Take 1 capsule by mouth as needed. [facility]    . vitamin B-12 (CYANOCOBALAMIN) 1000 MCG tablet Take 1,000 mcg by mouth daily.    Marland Kitchen amLODipine (NORVASC) 5 MG tablet Take 1 tablet (5 mg total) by mouth daily. 90 tablet 1  . fluticasone (FLONASE) 50 MCG/ACT nasal spray Place 2 sprays into both nostrils at bedtime. 16 g 1  . hydrochlorothiazide (HYDRODIURIL) 25 MG tablet Take 1 tablet (25 mg total) by mouth every morning. 90 tablet 1  . lisinopril (PRINIVIL,ZESTRIL) 10 MG tablet Take 1 tablet (10 mg total) by mouth daily. 90 tablet 1  . montelukast (SINGULAIR) 10 MG tablet Take 1 tablet (10 mg total) by mouth daily. 90  tablet 1  . pantoprazole (PROTONIX) 40 MG tablet Take 1 tablet (40 mg total) by mouth daily. 90 tablet 1  . SUMAtriptan (IMITREX) 50 MG tablet Take 1 tablet (50 mg total) by mouth as needed. 10 tablet 2  . atorvastatin (LIPITOR) 40 MG tablet Take 1 tablet (40 mg total) by mouth daily. 90 tablet 1  . magnesium gluconate (MAGONATE) 500 MG tablet Take 1 tablet by mouth 2 (two) times daily.    . metoprolol succinate (TOPROL-XL) 50 MG 24 hr tablet Take 1 tablet (50 mg total) by mouth daily. Take with or immediately following a meal. 90 tablet 1  . OXYCODONE HCL Take 1 tablet by mouth 2 (two) times daily. [facility]     No facility-administered medications prior to visit.     Review of Systems  Constitutional: Negative  for chills, fatigue, fever, malaise/fatigue and weight loss.  HENT: Negative for ear discharge, ear pain, hoarse voice and sore throat.   Eyes: Positive for blurred vision.  Respiratory: Negative for cough, sputum production, choking, shortness of breath and wheezing.   Cardiovascular: Negative for chest pain, palpitations, orthopnea, leg swelling and PND.  Gastrointestinal: Positive for heartburn. Negative for abdominal pain, blood in stool, constipation, diarrhea, dysphagia, melena and nausea.  Genitourinary: Negative for dysuria, frequency, hematuria and urgency.  Musculoskeletal: Negative for back pain, joint pain, myalgias, muscle weakness and neck pain.  Skin: Negative for rash.  Neurological: Positive for headaches. Negative for dizziness, tingling, sensory change and focal weakness.  Endo/Heme/Allergies: Negative for environmental allergies and polydipsia. Does not bruise/bleed easily.  Psychiatric/Behavioral: Negative for depression and suicidal ideas. The patient is not nervous/anxious and does not have insomnia.      Objective  Vitals:   03/02/17 0901  BP: 110/80  Pulse: 64  Weight: 164 lb (74.4 kg)  Height: 5\' 5"  (1.651 m)    Physical Exam  Constitutional: She  is well-developed, well-nourished, and in no distress. No distress.  HENT:  Head: Normocephalic and atraumatic.  Right Ear: External ear normal.  Left Ear: External ear normal.  Nose: Nose normal.  Mouth/Throat: Oropharynx is clear and moist.  Eyes: Conjunctivae and EOM are normal. Pupils are equal, round, and reactive to light. Right eye exhibits no discharge. Left eye exhibits no discharge.  Neck: Normal range of motion. Neck supple. No JVD present. No thyromegaly present.  Cardiovascular: Normal rate, regular rhythm, normal heart sounds and intact distal pulses.  Exam reveals no gallop and no friction rub.   No murmur heard. Pulmonary/Chest: Effort normal and breath sounds normal. She has no wheezes. She has no rales. Right breast exhibits no inverted nipple, no mass, no nipple discharge, no skin change and no tenderness. Left breast exhibits no inverted nipple, no mass, no nipple discharge, no skin change and no tenderness. Breasts are symmetrical.  Abdominal: Soft. Bowel sounds are normal. She exhibits no mass. There is no tenderness. There is no guarding.  Musculoskeletal: Normal range of motion. She exhibits no edema.  Lymphadenopathy:    She has no cervical adenopathy.  Neurological: She is alert. She has normal reflexes.  Skin: Skin is warm and dry. She is not diaphoretic.  Psychiatric: Mood and affect normal.  Nursing note and vitals reviewed.     Assessment & Plan  Problem List Items Addressed This Visit      Cardiovascular and Mediastinum   Essential hypertension - Primary   Relevant Medications   rosuvastatin (CRESTOR) 20 MG tablet   TOPROL XL 50 MG 24 hr tablet   metoprolol succinate (TOPROL-XL) 25 MG 24 hr tablet   amLODipine (NORVASC) 5 MG tablet   hydrochlorothiazide (HYDRODIURIL) 25 MG tablet   lisinopril (PRINIVIL,ZESTRIL) 10 MG tablet   Other Relevant Orders   Renal Function Panel   Migraine with aura and without status migrainosus, not intractable    Relevant Medications   rosuvastatin (CRESTOR) 20 MG tablet   OXYCONTIN 10 MG 12 hr tablet   TOPROL XL 50 MG 24 hr tablet   metoprolol succinate (TOPROL-XL) 25 MG 24 hr tablet   amLODipine (NORVASC) 5 MG tablet   hydrochlorothiazide (HYDRODIURIL) 25 MG tablet   lisinopril (PRINIVIL,ZESTRIL) 10 MG tablet   SUMAtriptan (IMITREX) 50 MG tablet     Respiratory   Seasonal allergic rhinitis due to pollen   Relevant Medications   fluticasone (FLONASE) 50  MCG/ACT nasal spray     Digestive   Gastroesophageal reflux disease   Relevant Medications   pantoprazole (PROTONIX) 40 MG tablet     Endocrine   Diabetes mellitus with no complication (HCC)   Relevant Medications   rosuvastatin (CRESTOR) 20 MG tablet   lisinopril (PRINIVIL,ZESTRIL) 10 MG tablet   Other Relevant Orders   Hemoglobin A1c   Lipid Profile   Microalbumin / creatinine urine ratio     Other   Familial multiple lipoprotein-type hyperlipidemia   Relevant Medications   rosuvastatin (CRESTOR) 20 MG tablet   TOPROL XL 50 MG 24 hr tablet   metoprolol succinate (TOPROL-XL) 25 MG 24 hr tablet   amLODipine (NORVASC) 5 MG tablet   hydrochlorothiazide (HYDRODIURIL) 25 MG tablet   lisinopril (PRINIVIL,ZESTRIL) 10 MG tablet   Other Relevant Orders   Lipid Profile   Taking medication for chronic disease   Relevant Orders   Hepatic Function Panel (6)    Other Visit Diagnoses    Menopause       Relevant Orders   DG Bone Density   Need for Tdap vaccination       Relevant Orders   Tdap vaccine greater than or equal to 7yo IM (Completed)      Meds ordered this encounter  Medications  . amLODipine (NORVASC) 5 MG tablet    Sig: Take 1 tablet (5 mg total) by mouth daily.    Dispense:  90 tablet    Refill:  1    sched appt for OCT  . fluticasone (FLONASE) 50 MCG/ACT nasal spray    Sig: Place 2 sprays into both nostrils at bedtime.    Dispense:  16 g    Refill:  1  . hydrochlorothiazide (HYDRODIURIL) 25 MG tablet    Sig:  Take 1 tablet (25 mg total) by mouth every morning.    Dispense:  90 tablet    Refill:  1    sched appt for OCT  . lisinopril (PRINIVIL,ZESTRIL) 10 MG tablet    Sig: Take 1 tablet (10 mg total) by mouth daily.    Dispense:  90 tablet    Refill:  1  . pantoprazole (PROTONIX) 40 MG tablet    Sig: Take 1 tablet (40 mg total) by mouth daily.    Dispense:  90 tablet    Refill:  1  . montelukast (SINGULAIR) 10 MG tablet    Sig: Take 1 tablet (10 mg total) by mouth daily.    Dispense:  90 tablet    Refill:  1  . SUMAtriptan (IMITREX) 50 MG tablet    Sig: Take 1 tablet (50 mg total) by mouth as needed.    Dispense:  10 tablet    Refill:  2      Dr. [facility]  03/02/17

## 2017-03-03 LAB — LIPID PANEL
Chol/HDL Ratio: 3.1 ratio units (ref 0.0–4.4)
Cholesterol, Total: 134 mg/dL (ref 100–199)
HDL: 43 mg/dL (ref >39)
LDL Calculated: 69 mg/dL (ref 0–99)
Triglycerides: 110 mg/dL (ref 0–149)
VLDL Cholesterol Cal: 22 mg/dL (ref 5–40)

## 2017-03-03 LAB — RENAL FUNCTION PANEL
Albumin: 4.5 g/dL (ref 3.6–4.8)
BUN/Creatinine Ratio: 28 (ref 12–28)
BUN: 19 mg/dL (ref 8–27)
CO2: 28 mmol/L (ref 18–29)
Calcium: 9.6 mg/dL (ref 8.7–10.3)
Chloride: 96 mmol/L (ref 96–106)
Creatinine, Ser: 0.67 mg/dL (ref 0.57–1.00)
GFR calc Af Amer: 105 mL/min/{1.73_m2} (ref >59)
GFR calc non Af Amer: 91 mL/min/{1.73_m2} (ref >59)
Glucose: 102 mg/dL — ABNORMAL HIGH (ref 65–99)
Phosphorus: 3.6 mg/dL (ref 2.5–4.5)
Potassium: 4.7 mmol/L (ref 3.5–5.2)
Sodium: 139 mmol/L (ref 134–144)

## 2017-03-03 LAB — MICROALBUMIN / CREATININE URINE RATIO
Creatinine, Urine: 34.7 mg/dL
Microalb/Creat Ratio: <8.6 mg/g creat (ref 0.0–30.0)
Microalbumin, Urine: <3 ug/mL

## 2017-03-03 LAB — HEPATIC FUNCTION PANEL (6)
ALT: 16 IU/L (ref 0–32)
AST: 12 IU/L (ref 0–40)
Alkaline Phosphatase: 36 IU/L — ABNORMAL LOW (ref 39–117)
Bilirubin Total: 0.4 mg/dL (ref 0.0–1.2)
Bilirubin, Direct: 0.14 mg/dL (ref 0.00–0.40)

## 2017-03-03 LAB — HEMOGLOBIN A1C
Est. average glucose Bld gHb Est-mCnc: 123 mg/dL
Hgb A1c MFr Bld: 5.9 % — ABNORMAL HIGH (ref 4.8–5.6)

## 2017-03-10 ENCOUNTER — Ambulatory Visit
Admission: RE | Admit: 2017-03-10 | Discharge: 2017-03-10 | Disposition: A | Discharge status: Home / Self Care | Payer: Medicare Other | Source: Ambulatory Visit | Attending: Family Medicine | Admitting: Family Medicine

## 2017-03-10 ENCOUNTER — Ambulatory Visit: Admission: RE | Admit: 2017-03-10 | Payer: Medicare Other | Source: Ambulatory Visit

## 2017-03-10 DIAGNOSIS — Z79899 Other long term (current) drug therapy: Secondary | ICD-10-CM | POA: Diagnosis not present

## 2017-03-10 DIAGNOSIS — Z780 Asymptomatic menopausal state: Secondary | ICD-10-CM

## 2017-03-10 DIAGNOSIS — N951 Menopausal and female climacteric states: Secondary | ICD-10-CM | POA: Insufficient documentation

## 2017-03-10 DIAGNOSIS — M549 Dorsalgia, unspecified: Secondary | ICD-10-CM | POA: Diagnosis not present

## 2017-03-10 DIAGNOSIS — E2839 Other primary ovarian failure: Secondary | ICD-10-CM | POA: Diagnosis not present

## 2017-03-18 DIAGNOSIS — E538 Deficiency of other specified B group vitamins: Secondary | ICD-10-CM | POA: Diagnosis not present

## 2017-03-18 DIAGNOSIS — E11649 Type 2 diabetes mellitus with hypoglycemia without coma: Secondary | ICD-10-CM | POA: Diagnosis not present

## 2017-03-18 DIAGNOSIS — E785 Hyperlipidemia, unspecified: Secondary | ICD-10-CM | POA: Diagnosis not present

## 2017-03-24 DIAGNOSIS — E119 Type 2 diabetes mellitus without complications: Secondary | ICD-10-CM | POA: Diagnosis not present

## 2017-03-24 DIAGNOSIS — H21233 Degeneration of iris (pigmentary), bilateral: Secondary | ICD-10-CM | POA: Diagnosis not present

## 2017-03-30 LAB — HM DIABETES EYE EXAM

## 2017-04-07 ENCOUNTER — Ambulatory Visit
Admission: RE | Admit: 2017-04-07 | Discharge: 2017-04-07 | Disposition: A | Discharge status: Home / Self Care | Payer: Medicare Other | Source: Ambulatory Visit | Attending: Obstetrics and Gynecology | Admitting: Obstetrics and Gynecology

## 2017-04-07 DIAGNOSIS — Z1231 Encounter for screening mammogram for malignant neoplasm of breast: Secondary | ICD-10-CM | POA: Diagnosis present

## 2017-04-07 HISTORY — DX: Malignant (primary) neoplasm, unspecified: C80.1

## 2017-04-14 DIAGNOSIS — L812 Freckles: Secondary | ICD-10-CM | POA: Diagnosis not present

## 2017-04-14 DIAGNOSIS — D1801 Hemangioma of skin and subcutaneous tissue: Secondary | ICD-10-CM | POA: Diagnosis not present

## 2017-04-14 DIAGNOSIS — D225 Melanocytic nevi of trunk: Secondary | ICD-10-CM | POA: Diagnosis not present

## 2017-04-14 DIAGNOSIS — L821 Other seborrheic keratosis: Secondary | ICD-10-CM | POA: Diagnosis not present

## 2017-04-14 DIAGNOSIS — Z808 Family history of malignant neoplasm of other organs or systems: Secondary | ICD-10-CM | POA: Diagnosis not present

## 2017-04-14 DIAGNOSIS — Z85828 Personal history of other malignant neoplasm of skin: Secondary | ICD-10-CM | POA: Diagnosis not present

## 2017-04-14 DIAGNOSIS — L570 Actinic keratosis: Secondary | ICD-10-CM | POA: Diagnosis not present

## 2017-04-14 DIAGNOSIS — Z1283 Encounter for screening for malignant neoplasm of skin: Secondary | ICD-10-CM | POA: Diagnosis not present

## 2017-05-12 ENCOUNTER — Other Ambulatory Visit: Payer: Self-pay | Admitting: Family Medicine

## 2017-05-14 ENCOUNTER — Other Ambulatory Visit: Payer: Self-pay

## 2017-06-08 DIAGNOSIS — E785 Hyperlipidemia, unspecified: Secondary | ICD-10-CM | POA: Diagnosis not present

## 2017-06-08 DIAGNOSIS — E538 Deficiency of other specified B group vitamins: Secondary | ICD-10-CM | POA: Diagnosis not present

## 2017-06-08 DIAGNOSIS — E11649 Type 2 diabetes mellitus with hypoglycemia without coma: Secondary | ICD-10-CM | POA: Diagnosis not present

## 2017-06-08 DIAGNOSIS — E119 Type 2 diabetes mellitus without complications: Secondary | ICD-10-CM | POA: Diagnosis not present

## 2017-08-04 ENCOUNTER — Other Ambulatory Visit: Payer: Self-pay | Admitting: Family Medicine

## 2017-08-04 DIAGNOSIS — M533 Sacrococcygeal disorders, not elsewhere classified: Secondary | ICD-10-CM | POA: Insufficient documentation

## 2017-08-11 DIAGNOSIS — M5441 Lumbago with sciatica, right side: Secondary | ICD-10-CM | POA: Diagnosis not present

## 2017-08-13 DIAGNOSIS — M5441 Lumbago with sciatica, right side: Secondary | ICD-10-CM | POA: Diagnosis not present

## 2017-08-18 DIAGNOSIS — M5441 Lumbago with sciatica, right side: Secondary | ICD-10-CM | POA: Diagnosis not present

## 2017-08-19 DIAGNOSIS — M5441 Lumbago with sciatica, right side: Secondary | ICD-10-CM | POA: Diagnosis not present

## 2017-08-24 DIAGNOSIS — M5441 Lumbago with sciatica, right side: Secondary | ICD-10-CM | POA: Diagnosis not present

## 2017-08-26 DIAGNOSIS — M5441 Lumbago with sciatica, right side: Secondary | ICD-10-CM | POA: Diagnosis not present

## 2017-08-30 DIAGNOSIS — M5441 Lumbago with sciatica, right side: Secondary | ICD-10-CM | POA: Diagnosis not present

## 2017-08-31 ENCOUNTER — Other Ambulatory Visit: Payer: Self-pay | Admitting: Family Medicine

## 2017-08-31 DIAGNOSIS — I10 Essential (primary) hypertension: Secondary | ICD-10-CM

## 2017-09-01 DIAGNOSIS — M5441 Lumbago with sciatica, right side: Secondary | ICD-10-CM | POA: Diagnosis not present

## 2017-09-06 ENCOUNTER — Ambulatory Visit (INDEPENDENT_AMBULATORY_CARE_PROVIDER_SITE_OTHER): Payer: Medicare Other | Admitting: Family Medicine

## 2017-09-06 ENCOUNTER — Encounter: Payer: Self-pay | Admitting: Family Medicine

## 2017-09-06 VITALS — BP 130/64 | HR 72 | Ht 65.0 in | Wt 169.0 lb

## 2017-09-06 DIAGNOSIS — E538 Deficiency of other specified B group vitamins: Secondary | ICD-10-CM

## 2017-09-06 DIAGNOSIS — J301 Allergic rhinitis due to pollen: Secondary | ICD-10-CM | POA: Diagnosis not present

## 2017-09-06 DIAGNOSIS — E785 Hyperlipidemia, unspecified: Secondary | ICD-10-CM

## 2017-09-06 DIAGNOSIS — K219 Gastro-esophageal reflux disease without esophagitis: Secondary | ICD-10-CM | POA: Diagnosis not present

## 2017-09-06 DIAGNOSIS — E559 Vitamin D deficiency, unspecified: Secondary | ICD-10-CM

## 2017-09-06 DIAGNOSIS — I10 Essential (primary) hypertension: Secondary | ICD-10-CM | POA: Diagnosis not present

## 2017-09-06 DIAGNOSIS — R69 Illness, unspecified: Secondary | ICD-10-CM | POA: Diagnosis not present

## 2017-09-06 DIAGNOSIS — G43109 Migraine with aura, not intractable, without status migrainosus: Secondary | ICD-10-CM

## 2017-09-06 DIAGNOSIS — Z23 Encounter for immunization: Secondary | ICD-10-CM | POA: Diagnosis not present

## 2017-09-06 DIAGNOSIS — E119 Type 2 diabetes mellitus without complications: Secondary | ICD-10-CM

## 2017-09-06 DIAGNOSIS — D519 Vitamin B12 deficiency anemia, unspecified: Secondary | ICD-10-CM

## 2017-09-06 DIAGNOSIS — M5441 Lumbago with sciatica, right side: Secondary | ICD-10-CM | POA: Diagnosis not present

## 2017-09-06 MED ORDER — SUMATRIPTAN SUCCINATE 50 MG PO TABS: 50.0000 mg | ORAL_TABLET | ORAL | 1 refills | Status: DC | PRN

## 2017-09-06 MED ORDER — AMLODIPINE BESYLATE 5 MG PO TABS
ORAL_TABLET | ORAL | 1 refills | Status: DC
Start: 2017-09-06 — End: 2018-03-09

## 2017-09-06 MED ORDER — FLUTICASONE PROPIONATE 50 MCG/ACT NA SUSP: 2.0000 | Freq: Every day | NASAL | 1 refills | Status: DC

## 2017-09-06 MED ORDER — LISINOPRIL 10 MG PO TABS: 10.0000 mg | ORAL_TABLET | Freq: Every day | ORAL | 1 refills | Status: DC

## 2017-09-06 MED ORDER — PANTOPRAZOLE SODIUM 40 MG PO TBEC: 40.0000 mg | DELAYED_RELEASE_TABLET | Freq: Every day | ORAL | 1 refills | Status: DC

## 2017-09-06 MED ORDER — HYDROCHLOROTHIAZIDE 25 MG PO TABS: ORAL_TABLET | ORAL | 1 refills | Status: DC

## 2017-09-06 MED ORDER — ROSUVASTATIN CALCIUM 20 MG PO TABS: 20.0000 mg | ORAL_TABLET | Freq: Every day | ORAL | 1 refills | Status: DC

## 2017-09-06 MED ORDER — MONTELUKAST SODIUM 10 MG PO TABS: 10.0000 mg | ORAL_TABLET | Freq: Every day | ORAL | 1 refills | Status: DC

## 2017-09-06 NOTE — Progress Notes (Signed)
Name: Nicole Cook   MRN: [id number]    DOB: 1951/10/21   Date:09/06/2017       Progress Note  Subjective  Chief Complaint  Chief Complaint  Patient presents with  . Hyperlipidemia  . Hypertension  . Allergic Rhinitis   . Gastroesophageal Reflux  . Migraine    Hyperlipidemia  This is a chronic problem. The current episode started more than 1 year ago. The problem is controlled. Recent lipid tests were reviewed and are normal. Exacerbating diseases include obesity. She has no history of chronic renal disease, diabetes, hypothyroidism, liver disease or nephrotic syndrome. There are no known factors aggravating her hyperlipidemia. Pertinent negatives include no chest pain, focal sensory loss, focal weakness, leg pain, myalgias or shortness of breath. Current antihyperlipidemic treatment includes statins. The current treatment provides moderate improvement of lipids. Compliance problems include adherence to diet.  Risk factors for coronary artery disease include diabetes mellitus, hypertension and dyslipidemia.  Hypertension  This is a chronic problem. The current episode started more than 1 year ago. The problem has been gradually improving since onset. The problem is controlled. Pertinent negatives include no blurred vision, chest pain, headaches, malaise/fatigue, neck pain, palpitations or shortness of breath. There are no associated agents to hypertension. Risk factors for coronary artery disease include obesity. Past treatments include diuretics, beta blockers and ACE inhibitors. The current treatment provides moderate improvement. There are no compliance problems.  Hypertensive end-organ damage includes CAD/MI. There is no history of angina, CVA, heart failure, left ventricular hypertrophy or PVD. There is no history of chronic renal disease, a hypertension causing med or renovascular disease.  Gastroesophageal Reflux  She reports no abdominal pain, no chest pain, no coughing, no heartburn,  no nausea, no sore throat or no wheezing. This is a chronic problem. The current episode started more than 1 year ago. The problem has been waxing and waning. The symptoms are aggravated by certain foods. Pertinent negatives include no anemia, fatigue, melena, muscle weakness, orthopnea or weight loss. She has tried a PPI for the symptoms. The treatment provided moderate relief.  Migraine   This is a chronic problem. The current episode started more than 1 year ago. The problem occurs intermittently. The problem has been unchanged. The pain is located in the retro-orbital region. The quality of the pain is described as throbbing. Associated symptoms include a visual change. Pertinent negatives include no abdominal pain, back pain, blurred vision, coughing, dizziness, ear pain, fever, insomnia, nausea, neck pain, sore throat, tingling or weight loss. Her past medical history is significant for hypertension and obesity.    No problem-specific Assessment & Plan notes found for this encounter.   Past Medical History:  Diagnosis Date  . Allergy   . Cancer ([place])    basal cell skin ca  . Diabetes mellitus without complication ([place])   . GERD (gastroesophageal reflux disease)   . Hyperlipidemia   . Hypertension   . Migraine     Past Surgical History:  Procedure Laterality Date  . back surgery x 2    . c-section x 3    . COLONOSCOPY  2011   cleared for 5 yrs- Dr Beckey Downing  . KNEE ARTHROSCOPY Right 2010  . VAGINAL HYSTERECTOMY  12/14/1994    Family History  Problem Relation Age of Onset  . Breast cancer Mother 65  . Breast cancer Sister 80  . Breast cancer Paternal Aunt     Social History   Social History  . Marital status: Married  Spouse name: N/A  . Number of children: N/A  . Years of education: N/A   Occupational History  . Not on file.   Social History Main Topics  . Smoking status: Never Smoker  . Smokeless tobacco: Never Used  . Alcohol use No  . Drug use: No  .  Sexual activity: Yes   Other Topics Concern  . Not on file   Social History Narrative  . No narrative on file    Allergies  Allergen Reactions  . Compazine [Prochlorperazine]   . Penicillins   . Sulfa Antibiotics     Outpatient Medications Prior to Visit  Medication Sig Dispense Refill  . aspirin 81 MG tablet Take 1 tablet by mouth daily.    Marland Kitchen BLOOD GLUCOSE MONITORING SUPPL 1 application daily.    . cetirizine (ZYRTEC) 10 MG tablet Take 10 mg by mouth daily. otc    . Cholecalciferol (VITAMIN D3) 1000 units CAPS Take 1 capsule by mouth daily.    . Coenzyme Q10 (EQL COQ10) 300 MG CAPS Take 1 capsule by mouth daily.    . Dulaglutide (TRULICITY) 7.67 HA/1.9FX SOPN Inject into the skin. Dr Eddie Dibbles    . estradiol (ESTRING) 2 MG vaginal ring Place 2 mg vaginally every 3 (three) months. follow package directions- GYN    . gabapentin (NEURONTIN) 300 MG capsule Take 2 capsules by mouth 2 (two) times daily. [facility]- may take 3 capsules 3 times per day per pt    . HYDROcodone-acetaminophen (NORCO) 10-325 MG per tablet Take 1 tablet by mouth as needed. [facility]    . Lancets (ACCU-CHEK MULTICLIX) lancets USE 1 LANCET DAILY 102 each 0  . Lido-Capsaicin-Men-Methyl Sal 0.5-0.035-5-20 % PTCH Apply 1 application topically as needed. [facility]    . Magnesium 500 MG CAPS Take 1 capsule by mouth daily.    . metFORMIN (GLUCOPHAGE) 500 MG tablet Take 1,000 mg by mouth 2 (two) times daily. Dr Eddie Dibbles    . metoprolol succinate (TOPROL-XL) 25 MG 24 hr tablet Take 1 tablet by mouth daily. Cardio- takes with 50mg = 75    . metoprolol succinate (TOPROL-XL) 50 MG 24 hr tablet TAKE 1 TABLET DAILY WITH OR IMMEDIATELY FOLLOWING A MEAL 30 tablet 0  . Multiple Vitamins-Minerals (CENTRUM SILVER ULTRA WOMENS PO) Take 1 tablet by mouth daily.    . nitroGLYCERIN (NITROSTAT) 0.4 MG SL tablet Place 1 tablet under the tongue as needed.    . OXYCONTIN 10 MG 12 hr tablet Take 10 mg by mouth 2 (two) times daily. Pain Dr  0  .  tiZANidine (ZANAFLEX) 4 MG capsule Take 1 capsule by mouth as needed. [facility]    . vitamin B-12 (CYANOCOBALAMIN) 1000 MCG tablet Take 1,000 mcg by mouth daily.    Marland Kitchen amLODipine (NORVASC) 5 MG tablet TAKE 1 TABLET DAILY (SCHEDULE APPOINTMENT FOR OCT) 90 tablet 0  . fluticasone (FLONASE) 50 MCG/ACT nasal spray Place 2 sprays into both nostrils at bedtime. 16 g 1  . hydrochlorothiazide (HYDRODIURIL) 25 MG tablet TAKE 1 TABLET EVERY MORNING (SCHEDULE APPOINTMENT FOR OCT) 90 tablet 0  . lisinopril (PRINIVIL,ZESTRIL) 10 MG tablet Take 1 tablet (10 mg total) by mouth daily. 90 tablet 1  . montelukast (SINGULAIR) 10 MG tablet Take 1 tablet (10 mg total) by mouth daily. 90 tablet 1  . pantoprazole (PROTONIX) 40 MG tablet Take 1 tablet (40 mg total) by mouth daily. 90 tablet 1  . rosuvastatin (CRESTOR) 20 MG tablet Take 1 tablet by mouth daily.  Dr Chryl Heck    . SUMAtriptan (IMITREX) 50 MG tablet Take 1 tablet (50 mg total) by mouth as needed. 10 tablet 2  . canagliflozin (INVOKANA) 100 MG TABS tablet Take 100 mg by mouth daily. Dr Eddie Dibbles     No facility-administered medications prior to visit.     Review of Systems  Constitutional: Negative for chills, fatigue, fever, malaise/fatigue and weight loss.  HENT: Negative for ear discharge, ear pain and sore throat.   Eyes: Negative for blurred vision.  Respiratory: Negative for cough, sputum production, shortness of breath and wheezing.   Cardiovascular: Negative for chest pain, palpitations and leg swelling.  Gastrointestinal: Negative for abdominal pain, blood in stool, constipation, diarrhea, heartburn, melena and nausea.  Genitourinary: Negative for dysuria, frequency, hematuria and urgency.  Musculoskeletal: Negative for back pain, joint pain, myalgias, muscle weakness and neck pain.  Skin: Negative for rash.  Neurological: Negative for dizziness, tingling, sensory change, focal weakness and headaches.  Endo/Heme/Allergies: Negative for environmental  allergies and polydipsia. Does not bruise/bleed easily.  Psychiatric/Behavioral: Negative for depression and suicidal ideas. The patient is not nervous/anxious and does not have insomnia.      Objective  Vitals:   09/06/17 0937  BP: 130/64  Pulse: 72  Weight: 169 lb (76.7 kg)  Height: 5\' 5"  (1.651 m)    Physical Exam  Constitutional: She is well-developed, well-nourished, and in no distress. No distress.  HENT:  Head: Normocephalic and atraumatic.  Right Ear: External ear normal.  Left Ear: External ear normal.  Nose: Nose normal.  Mouth/Throat: Oropharynx is clear and moist.  Eyes: Pupils are equal, round, and reactive to light. Conjunctivae and EOM are normal. Right eye exhibits no discharge. Left eye exhibits no discharge.  Neck: Normal range of motion. Neck supple. No JVD present. No thyromegaly present.  Cardiovascular: Normal rate, regular rhythm, normal heart sounds and intact distal pulses.  Exam reveals no gallop and no friction rub.   No murmur heard. Pulmonary/Chest: Effort normal and breath sounds normal. She has no wheezes. She has no rales.  Abdominal: Soft. Bowel sounds are normal. She exhibits no mass. There is no tenderness. There is no guarding.  Musculoskeletal: Normal range of motion. She exhibits no edema.  Lymphadenopathy:    She has no cervical adenopathy.  Neurological: She is alert. She has normal reflexes.  Skin: Skin is warm and dry. She is not diaphoretic.  Psychiatric: Mood and affect normal.  Nursing note and vitals reviewed.     Assessment & Plan  Problem List Items Addressed This Visit      Cardiovascular and Mediastinum   Essential hypertension - Primary   Relevant Medications   amLODipine (NORVASC) 5 MG tablet   hydrochlorothiazide (HYDRODIURIL) 25 MG tablet   lisinopril (PRINIVIL,ZESTRIL) 10 MG tablet   rosuvastatin (CRESTOR) 20 MG tablet   Other Relevant Orders   Renal Function Panel   Migraine with aura and without status  migrainosus, not intractable   Relevant Medications   amLODipine (NORVASC) 5 MG tablet   hydrochlorothiazide (HYDRODIURIL) 25 MG tablet   lisinopril (PRINIVIL,ZESTRIL) 10 MG tablet   rosuvastatin (CRESTOR) 20 MG tablet   SUMAtriptan (IMITREX) 50 MG tablet     Respiratory   Seasonal allergic rhinitis due to pollen   Relevant Medications   fluticasone (FLONASE) 50 MCG/ACT nasal spray   montelukast (SINGULAIR) 10 MG tablet     Digestive   Gastroesophageal reflux disease   Relevant Medications   pantoprazole (PROTONIX) 40 MG tablet  Other   Taking medication for chronic disease   Relevant Orders   Hepatic function panel    Other Visit Diagnoses    Hyperlipidemia, unspecified hyperlipidemia type       Relevant Medications   amLODipine (NORVASC) 5 MG tablet   hydrochlorothiazide (HYDRODIURIL) 25 MG tablet   lisinopril (PRINIVIL,ZESTRIL) 10 MG tablet   rosuvastatin (CRESTOR) 20 MG tablet   Other Relevant Orders   Lipid Profile   Type 2 diabetes mellitus without complication, without long-term current use of insulin (HCC)       Relevant Medications   lisinopril (PRINIVIL,ZESTRIL) 10 MG tablet   rosuvastatin (CRESTOR) 20 MG tablet   Other Relevant Orders   HgB A1c   Vitamin B12 deficiency       Vitamin D deficiency       Taking multiple medications for chronic disease       Influenza vaccine needed       Relevant Orders   Flu vaccine HIGH DOSE PF (Completed)   Anemia due to vitamin B12 deficiency, unspecified B12 deficiency type          Meds ordered this encounter  Medications  . amLODipine (NORVASC) 5 MG tablet    Sig: TAKE 1 TABLET DAILY    Dispense:  90 tablet    Refill:  1  . fluticasone (FLONASE) 50 MCG/ACT nasal spray    Sig: Place 2 sprays into both nostrils at bedtime.    Dispense:  16 g    Refill:  1    Pt wants a 90 days supply  . hydrochlorothiazide (HYDRODIURIL) 25 MG tablet    Sig: TAKE 1 TABLET EVERY MORNING    Dispense:  90 tablet    Refill:  1   . lisinopril (PRINIVIL,ZESTRIL) 10 MG tablet    Sig: Take 1 tablet (10 mg total) by mouth daily.    Dispense:  90 tablet    Refill:  1  . montelukast (SINGULAIR) 10 MG tablet    Sig: Take 1 tablet (10 mg total) by mouth daily.    Dispense:  90 tablet    Refill:  1  . pantoprazole (PROTONIX) 40 MG tablet    Sig: Take 1 tablet (40 mg total) by mouth daily.    Dispense:  90 tablet    Refill:  1  . rosuvastatin (CRESTOR) 20 MG tablet    Sig: Take 1 tablet (20 mg total) by mouth daily. Dr Chryl Heck    Dispense:  90 tablet    Refill:  1  . SUMAtriptan (IMITREX) 50 MG tablet    Sig: Take 1 tablet (50 mg total) by mouth as needed.    Dispense:  10 tablet    Refill:  1      Dr. Mac[facility]  09/06/17

## 2017-09-07 LAB — HEPATIC FUNCTION PANEL
ALT: 20 IU/L (ref 0–32)
AST: 20 IU/L (ref 0–40)
Alkaline Phosphatase: 39 IU/L (ref 39–117)
Bilirubin Total: 0.3 mg/dL (ref 0.0–1.2)
Bilirubin, Direct: 0.11 mg/dL (ref 0.00–0.40)
Total Protein: 7 g/dL (ref 6.0–8.5)

## 2017-09-07 LAB — LIPID PANEL
Chol/HDL Ratio: 3 ratio (ref 0.0–4.4)
Cholesterol, Total: 131 mg/dL (ref 100–199)
HDL: 44 mg/dL (ref >39)
LDL Calculated: 54 mg/dL (ref 0–99)
Triglycerides: 164 mg/dL — ABNORMAL HIGH (ref 0–149)
VLDL Cholesterol Cal: 33 mg/dL (ref 5–40)

## 2017-09-07 LAB — RENAL FUNCTION PANEL
Albumin: 4.7 g/dL (ref 3.6–4.8)
BUN/Creatinine Ratio: 18 (ref 12–28)
BUN: 12 mg/dL (ref 8–27)
CO2: 27 mmol/L (ref 20–29)
Calcium: 10 mg/dL (ref 8.7–10.3)
Chloride: 98 mmol/L (ref 96–106)
Creatinine, Ser: 0.65 mg/dL (ref 0.57–1.00)
GFR calc Af Amer: 105 mL/min/{1.73_m2} (ref >59)
GFR calc non Af Amer: 92 mL/min/{1.73_m2} (ref >59)
Glucose: 112 mg/dL — ABNORMAL HIGH (ref 65–99)
Phosphorus: 3.5 mg/dL (ref 2.5–4.5)
Potassium: 4.8 mmol/L (ref 3.5–5.2)
Sodium: 141 mmol/L (ref 134–144)

## 2017-09-07 LAB — HEMOGLOBIN A1C
Est. average glucose Bld gHb Est-mCnc: 134 mg/dL
Hgb A1c MFr Bld: 6.3 % — ABNORMAL HIGH (ref 4.8–5.6)

## 2017-09-08 DIAGNOSIS — M5441 Lumbago with sciatica, right side: Secondary | ICD-10-CM | POA: Diagnosis not present

## 2017-09-13 DIAGNOSIS — M5441 Lumbago with sciatica, right side: Secondary | ICD-10-CM | POA: Diagnosis not present

## 2017-09-15 DIAGNOSIS — M5441 Lumbago with sciatica, right side: Secondary | ICD-10-CM | POA: Diagnosis not present

## 2017-09-20 DIAGNOSIS — M5441 Lumbago with sciatica, right side: Secondary | ICD-10-CM | POA: Diagnosis not present

## 2017-09-23 DIAGNOSIS — M5441 Lumbago with sciatica, right side: Secondary | ICD-10-CM | POA: Diagnosis not present

## 2017-09-23 DIAGNOSIS — M533 Sacrococcygeal disorders, not elsewhere classified: Secondary | ICD-10-CM | POA: Diagnosis not present

## 2017-09-27 DIAGNOSIS — M5441 Lumbago with sciatica, right side: Secondary | ICD-10-CM | POA: Diagnosis not present

## 2017-09-30 DIAGNOSIS — M5441 Lumbago with sciatica, right side: Secondary | ICD-10-CM | POA: Diagnosis not present

## 2017-10-04 DIAGNOSIS — M5441 Lumbago with sciatica, right side: Secondary | ICD-10-CM | POA: Diagnosis not present

## 2017-10-08 DIAGNOSIS — M5441 Lumbago with sciatica, right side: Secondary | ICD-10-CM | POA: Diagnosis not present

## 2017-10-13 DIAGNOSIS — M5441 Lumbago with sciatica, right side: Secondary | ICD-10-CM | POA: Diagnosis not present

## 2017-10-18 DIAGNOSIS — M5441 Lumbago with sciatica, right side: Secondary | ICD-10-CM | POA: Diagnosis not present

## 2017-10-19 DIAGNOSIS — D1801 Hemangioma of skin and subcutaneous tissue: Secondary | ICD-10-CM | POA: Diagnosis not present

## 2017-10-19 DIAGNOSIS — D225 Melanocytic nevi of trunk: Secondary | ICD-10-CM | POA: Diagnosis not present

## 2017-10-19 DIAGNOSIS — L570 Actinic keratosis: Secondary | ICD-10-CM | POA: Diagnosis not present

## 2017-10-19 DIAGNOSIS — Z808 Family history of malignant neoplasm of other organs or systems: Secondary | ICD-10-CM | POA: Diagnosis not present

## 2017-10-19 DIAGNOSIS — L812 Freckles: Secondary | ICD-10-CM | POA: Diagnosis not present

## 2017-10-19 DIAGNOSIS — D1724 Benign lipomatous neoplasm of skin and subcutaneous tissue of left leg: Secondary | ICD-10-CM | POA: Diagnosis not present

## 2017-10-19 DIAGNOSIS — Z1283 Encounter for screening for malignant neoplasm of skin: Secondary | ICD-10-CM | POA: Diagnosis not present

## 2017-10-19 DIAGNOSIS — L821 Other seborrheic keratosis: Secondary | ICD-10-CM | POA: Diagnosis not present

## 2017-10-19 DIAGNOSIS — Z85828 Personal history of other malignant neoplasm of skin: Secondary | ICD-10-CM | POA: Diagnosis not present

## 2017-10-20 DIAGNOSIS — M5441 Lumbago with sciatica, right side: Secondary | ICD-10-CM | POA: Diagnosis not present

## 2017-10-26 DIAGNOSIS — M5441 Lumbago with sciatica, right side: Secondary | ICD-10-CM | POA: Diagnosis not present

## 2017-10-28 DIAGNOSIS — M5441 Lumbago with sciatica, right side: Secondary | ICD-10-CM | POA: Diagnosis not present

## 2017-11-01 DIAGNOSIS — M5441 Lumbago with sciatica, right side: Secondary | ICD-10-CM | POA: Diagnosis not present

## 2017-11-08 ENCOUNTER — Telehealth: Payer: Self-pay | Admitting: Family Medicine

## 2017-11-08 NOTE — Telephone Encounter (Signed)
Called pt to sched for AWV with [facility]. Last AWV on 10/14/16  please schedule AWV with [facility] any date after 10/14/17. C/b #  [phone] on Skype [email or web address] if you have questions

## 2017-11-09 DIAGNOSIS — M5441 Lumbago with sciatica, right side: Secondary | ICD-10-CM | POA: Diagnosis not present

## 2017-11-09 DIAGNOSIS — M48061 Spinal stenosis, lumbar region without neurogenic claudication: Secondary | ICD-10-CM | POA: Diagnosis not present

## 2017-11-09 DIAGNOSIS — M5137 Other intervertebral disc degeneration, lumbosacral region: Secondary | ICD-10-CM | POA: Diagnosis not present

## 2017-11-10 ENCOUNTER — Telehealth: Payer: Self-pay | Admitting: Family Medicine

## 2017-11-10 NOTE — Telephone Encounter (Signed)
Nicole Cook, spoke to pt about scheduling AWV for December and she was not interested in coming in 3 times once for meds in Sept again for AWV and again for CPE w/ Dr. Ronnald Ramp. I told her that she would be able to do it this year with Dr. Ronnald Ramp but next year we'd prefer she see [name] for her AWV. She was trying to figure out if she would again have to come in 3 times since her meds would be running out next September. Could you please call her to discuss. Thanks!

## 2017-11-11 NOTE — Telephone Encounter (Signed)
Spoke to pt. Explained the difference in physical with [facility] versus [facility] with [name]. Pt is willing to schedule with [name], transferred to front to set up appt

## 2017-11-11 NOTE — Telephone Encounter (Signed)
Thank you :)

## 2017-11-12 DIAGNOSIS — M5441 Lumbago with sciatica, right side: Secondary | ICD-10-CM | POA: Diagnosis not present

## 2017-11-15 ENCOUNTER — Ambulatory Visit (INDEPENDENT_AMBULATORY_CARE_PROVIDER_SITE_OTHER): Payer: Medicare Other

## 2017-11-15 VITALS — BP 110/62 | HR 64 | Temp 98.5°F | Resp 16 | Ht 65.0 in | Wt 172.6 lb

## 2017-11-15 DIAGNOSIS — M5441 Lumbago with sciatica, right side: Secondary | ICD-10-CM | POA: Diagnosis not present

## 2017-11-15 DIAGNOSIS — Z0000 Encounter for general adult medical examination without abnormal findings: Secondary | ICD-10-CM

## 2017-11-15 NOTE — Progress Notes (Signed)
Subjective:   Nicole Cook is a 66 y.o. female who presents for Medicare Annual (Subsequent) preventive examination.  Review of Systems:  N/A Cardiac Risk Factors include: advanced age (>24mn, >>69women);diabetes mellitus;hypertension     Objective:     Vitals: BP 110/62 (BP Location: Right Arm, Patient Position: Sitting, Cuff Size: Normal)   Pulse 64   Temp 98.5 F (36.9 C) (Oral)   Resp 16   Ht '5\' 5"'$  (1.651 m)   Wt 172 lb 9.6 oz (78.3 kg)   BMI 28.72 kg/m   Body mass index is 28.72 kg/m.  Advanced Directives 11/15/2017 10/07/2015 09/02/2015  Does Patient Have a Medical Advance Directive? No No No  Would patient like information on creating a medical advance directive? Yes (MAU/Ambulatory/Procedural Areas - Information given) No - patient declined information No - patient declined information    Tobacco Social History   Tobacco Use  Smoking Status Never Smoker  Smokeless Tobacco Never Used     Counseling given: Not Answered   Clinical Intake:  Pre-visit preparation completed: Yes  Pain : No/denies pain     BMI - recorded: 28.72 Nutritional Status: BMI 25 -29 Overweight Nutritional Risks: None  Activities of Daily Living: Independent Ambulation: Independent with device- listed below Home Assistive Devices/Equipment: Eyeglasses, Other (Comment), Shower/tub chair, CPAP(grab bars in shower) Medication Administration: Independent Home Management: Independent  Barriers to Care Management & Learning: None  Do you feel unsafe in your current relationship?: No(married) Do you feel physically threatened by others?: No Anyone hurting you at home, work, or school?: No  How often do you need to have someone help you when you read instructions, pamphlets, or other written materials from your doctor or pharmacy?: 1 - Never What is the last grade level you completed in school?: BS degree  Interpreter Needed?: No  Information entered by :: AEversole,  LPN  Past Medical History:  Diagnosis Date  . Allergy   . [place]    basal cell skin ca  . Diabetes mellitus without complication ([place]   . GERD (gastroesophageal reflux disease)   . Hyperlipidemia   . Hypertension   . Migraine    Past Surgical History:  Procedure Laterality Date  . back surgery x 2    . c-section x 3    . COLONOSCOPY  2011   cleared for 5 yrs- Dr SBeckey Downing . KNEE ARTHROSCOPY Right 2010  . VAGINAL HYSTERECTOMY  12/14/1994   Family History  Problem Relation Age of Onset  . Breast cancer Mother 880 . Breast cancer Sister 538 . Breast cancer Paternal Aunt    Social History   Socioeconomic History  . Marital status: Married    Spouse name: None  . Number of children: 3  . Years of education: None  . Highest education level: None  Social Needs  . Financial resource strain: Not hard at all  . Food insecurity - worry: Never true  . Food insecurity - inability: Never true  . Transportation needs - medical: No  . Transportation needs - non-medical: No  Occupational History  . Occupation: Retired  Tobacco Use  . Smoking status: Never Smoker  . Smokeless tobacco: Never Used  Substance and Sexual Activity  . Alcohol use: No    Alcohol/week: 0.0 oz  . Drug use: No  . Sexual activity: Yes  Other Topics Concern  . None  Social History Narrative  . None    Outpatient Encounter Medications as of 11/15/2017  Medication Sig  . amLODipine (NORVASC) 5 MG tablet TAKE 1 TABLET DAILY  . aspirin 81 MG tablet Take 1 tablet by mouth daily.  Marland Kitchen BLOOD GLUCOSE MONITORING SUPPL 1 application daily.  . cetirizine (ZYRTEC) 10 MG tablet Take 10 mg by mouth daily. otc  . Cholecalciferol (VITAMIN D3) 1000 units CAPS Take 1 capsule by mouth daily.  . Coenzyme Q10 (EQL COQ10) 300 MG CAPS Take 1 capsule by mouth daily.  . Dulaglutide (TRULICITY) 9.56 LO/7.5IE SOPN Inject into the skin. Dr Eddie Dibbles  . estradiol (ESTRING) 2 MG vaginal ring Place 2 mg vaginally every 3  (three) months. follow package directions- GYN  . fluticasone (FLONASE) 50 MCG/ACT nasal spray Place 2 sprays into both nostrils at bedtime.  . gabapentin (NEURONTIN) 300 MG capsule Take 2 capsules by mouth 2 (two) times daily. [facility]- may take 3 capsules 3 times per day per pt  . hydrochlorothiazide (HYDRODIURIL) 25 MG tablet TAKE 1 TABLET EVERY MORNING  . HYDROcodone-acetaminophen (NORCO) 10-325 MG per tablet Take 1 tablet by mouth as needed. [facility]  . Lancets (ACCU-CHEK MULTICLIX) lancets USE 1 LANCET DAILY  . Lido-Capsaicin-Men-Methyl Sal 0.5-0.035-5-20 % PTCH Apply 1 application topically as needed. [facility]  . lisinopril (PRINIVIL,ZESTRIL) 10 MG tablet Take 1 tablet (10 mg total) by mouth daily.  . Magnesium 500 MG CAPS Take 1 capsule by mouth daily.  . metFORMIN (GLUCOPHAGE) 500 MG tablet Take 1,000 mg by mouth 2 (two) times daily. Dr Eddie Dibbles  . metoprolol succinate (TOPROL-XL) 25 MG 24 hr tablet Take 1 tablet by mouth daily. Cardio- takes with '50mg'$ = 75  . metoprolol succinate (TOPROL-XL) 50 MG 24 hr tablet TAKE 1 TABLET DAILY WITH OR IMMEDIATELY FOLLOWING A MEAL  . montelukast (SINGULAIR) 10 MG tablet Take 1 tablet (10 mg total) by mouth daily.  . Multiple Vitamins-Minerals (CENTRUM SILVER ULTRA WOMENS PO) Take 1 tablet by mouth daily.  . nitroGLYCERIN (NITROSTAT) 0.4 MG SL tablet Place 1 tablet under the tongue as needed.  . OXYCONTIN 10 MG 12 hr tablet Take 10 mg by mouth 2 (two) times daily. Pain Dr  . pantoprazole (PROTONIX) 40 MG tablet Take 1 tablet (40 mg total) by mouth daily.  . rosuvastatin (CRESTOR) 20 MG tablet Take 1 tablet (20 mg total) by mouth daily. Dr Chryl Heck  . SUMAtriptan (IMITREX) 50 MG tablet Take 1 tablet (50 mg total) by mouth as needed.  Marland Kitchen tiZANidine (ZANAFLEX) 4 MG capsule Take 1 capsule by mouth as needed. [facility]  . vitamin B-12 (CYANOCOBALAMIN) 1000 MCG tablet Take 1,000 mcg by mouth daily.   No facility-administered encounter medications on file as of  11/15/2017.     Activities of Daily Living In your present state of health, do you have any difficulty performing the following activities: 11/15/2017  Hearing? N  Vision? N  Difficulty concentrating or making decisions? Y  Comment short term memory loss  Walking or climbing stairs? N  Dressing or bathing? N  Doing errands, shopping? N  Preparing Food and eating ? N  Using the Toilet? N  In the past six months, have you accidently leaked urine? Y  Comment stress an urge incontinence  Do you have problems with loss of bowel control? N  Managing your Medications? N  Managing your Finances? N  Housekeeping or managing your Housekeeping? N  Some recent data might be hidden    Timed Get Up and Go performed: Yes. 9sec. Not deemed high risk for fall. No intervention required  Patient Care Team: Juline Patch, MD as PCP - General (Family Medicine) Vergia Alcon (Inactive) as Consulting Physician Krystal Eaton, MD as Consulting Physician (Occupational Medicine) Enos Fling, MD as Consulting Physician (Obstetrics and Gynecology) Rolena Infante, MD as Consulting Physician (Physical Medicine and Rehabilitation) Elease Etienne, MD as Consulting Physician (Internal Medicine)    Assessment:   Exercise Activities and Dietary recommendations Current Exercise Habits: Structured exercise class, Type of exercise: walking;Other - see comments(PT for back pain), Time (Minutes): 60, Frequency (Times/Week): 6, Weekly Exercise (Minutes/Week): 360, Intensity: Mild, Exercise limited by: orthopedic condition(s)(lower back pain)  Goals    . DIET - INCREASE WATER INTAKE     Recommend to drink at least 6-8 8oz glasses of water per day       Fall Risk Fall Risk  11/15/2017 09/06/2017 09/02/2016 09/02/2015  Falls in the past year? Yes No No No  Number falls in past yr: 1 - - -  Injury with Fall? No - - -  Risk for fall due to : Other (Comment) - - -  Risk for fall due to: Comment N/A - - -   Follow up Education provided;Falls prevention discussed - - -   Is the patient's home free of loose throw rugs in walkways, pet beds, electrical cords, etc?   yes      Grab bars in the bathroom? yes      Handrails on the stairs?   yes      Adequate lighting?   yes  Depression Screen PHQ 2/9 Scores 11/15/2017 09/06/2017 09/02/2016 09/02/2015  PHQ - 2 Score 0 0 0 0  PHQ- 9 Score - 0 - -     Cognitive Function     6CIT Screen 11/15/2017  What Year? 0 points  What month? 0 points  What time? 0 points  Count back from 20 0 points  Months in reverse 0 points  Repeat phrase 4 points  Total Score 4    Immunization History  Administered Date(s) Administered  . Hepatitis B 12/14/1998  . Influenza, High Dose Seasonal PF 09/06/2017  . Influenza,inj,Quad PF,6+ Mos 09/02/2015, 09/02/2016  . Influenza-Unspecified 09/13/2012, 08/29/2013, 01/10/2015, 02/10/2015, 09/02/2015, 09/02/2016  . MMR 05/14/1981  . Pneumococcal Polysaccharide-23 08/29/2014  . Pneumococcal-Unspecified 05/14/2009, 08/14/2014  . Td 12/14/2005  . Tdap 01/14/2007, 03/02/2017  . Zoster 01/14/2015, 02/10/2015   Screening Tests Health Maintenance  Topic Date Due  . FOOT EXAM  08/02/1959  . COLONOSCOPY  08/02/1999  . HEMOGLOBIN A1C  03/06/2018  . OPHTHALMOLOGY EXAM  03/30/2018  . MAMMOGRAM  04/07/2018  . TETANUS/TDAP  03/03/2027  . INFLUENZA VACCINE  Completed  . DEXA SCAN  Completed  . Hepatitis C Screening  Completed  . PNA vac Low Risk Adult  Completed   Cancer Screenings: Lung: Low Dose CT Chest recommended if Age 32-80 years, 30 pack-year currently smoking OR have quit w/in 15years. Patient does not qualify. Breast: Up to date on Mammogram? Yes  Completed 04/07/17. Repeat mammogram in one year Up to date of Bone Density/Dexa? Yes  Completed 03/10/17. Osteoporotic screenings no longer required Colorectal: Unable to locate most recent colonoscopy report. Pt admitted she is over due for colon cancer screenings  because of recent cardiac stenting. Advised pt to provide a copy of her most recent colonoscopy AND to schedule repeat colonoscopy with Gastroenterologist  Additional Screenings: HIV screening: no required Hepatitis C Screening: Completed 03/02/16     Plan:    I have personally reviewed and addressed  the Medicare Annual Wellness questionnaire and have noted the following in the patient's chart:  A. Medical and social history B. Use of alcohol, tobacco or illicit drugs  C. Current medications and supplements D. Functional ability and status E.  Nutritional status F.  Physical activity G. Advance directives H. List of other physicians I.  Hospitalizations, surgeries, and ER visits in previous 12 months J.  [place] such as hearing and vision if needed, cognitive and depression L. Referrals and appointments - none  In addition, I have reviewed and discussed with patient certain preventive protocols, quality metrics, and best practice recommendations. A written personalized care plan for preventive services as well as general preventive health recommendations were provided to patient.  See attached scanned questionnaire for additional information.   Signed,  Aleatha Borer, LPN Nurse Health Advisor  MD Recommendations: Over due for colon cancer screening. Unable to locate most recent colonoscopy report. Pt admitted she is over due for colon cancer screenings and due in part to her most recent cardiac stenting. Pt has been advised to provide a copy of her most recent colonoscopy AND to schedule her repeat colonoscopy with her Gastroenterologist.

## 2017-11-15 NOTE — Patient Instructions (Signed)
Nicole Cook , Thank you for taking time to come for your Medicare Wellness Visit. I appreciate your ongoing commitment to your health goals. Please review the following plan we discussed and let me know if I can assist you in the future.   Screening recommendations/referrals: Colonoscopy: Please provide a copy of your most recent colonoscopy. Mammogram: Completed 04/07/17. Repeat mammogram in one year Bone Density: Completed 03/10/17. No longer required to repeat Osteoporotic screenings. Recommended yearly ophthalmology/optometry visit for glaucoma screening and checkup Recommended yearly dental visit for hygiene and checkup  Vaccinations: Influenza vaccine: Completed 09/06/17 Pneumococcal vaccine: Completed PPSV23 08/29/14. PCV13 completed 02/27/15 Tdap vaccine: Completed 03/02/17 Shingles vaccine: Completed 02/10/15    Advanced directives: Advance directive discussed with you today. I have provided a copy for you to complete at home and have notarized. Once this is complete please bring a copy in to our office so we can scan it into your chart.  Conditions/risks identified: Recommend to drink at least 6-8 8oz glasses of water per day  Next appointment: You are scheduled to see Dr. Ronnald Ramp on 11/17/17 @ 9:30am.   Please schedule your annual wellness exam with your Nurse Health Advisor in one year.    Preventive Care 73 Years and Older, Female Preventive care refers to lifestyle choices and visits with your health care provider that can promote health and wellness. What does preventive care include?  A yearly physical exam. This is also called an annual well check.  Dental exams once or twice a year.  Routine eye exams. Ask your health care provider how often you should have your eyes checked.  Personal lifestyle choices, including:  Daily care of your teeth and gums.  Regular physical activity.  Eating a healthy diet.  Avoiding tobacco and drug use.  Limiting alcohol  use.  Practicing safe sex.  Taking low-dose aspirin every day.  Taking vitamin and mineral supplements as recommended by your health care provider. What happens during an annual well check? The services and screenings done by your health care provider during your annual well check will depend on your age, overall health, lifestyle risk factors, and family history of disease. Counseling  Your health care provider may ask you questions about your:  Alcohol use.  Tobacco use.  Drug use.  Emotional well-being.  Home and relationship well-being.  Sexual activity.  Eating habits.  History of falls.  Memory and ability to understand (cognition).  Work and work Statistician.  Reproductive health. Screening  You may have the following tests or measurements:  Height, weight, and BMI.  Blood pressure.  Lipid and cholesterol levels. These may be checked every 5 years, or more frequently if you are over 75 years old.  Skin check.  Lung cancer screening. You may have this screening every year starting at age 64 if you have a 30-pack-year history of smoking and currently smoke or have quit within the past 15 years.  Fecal occult blood test (FOBT) of the stool. You may have this test every year starting at age 25.  Flexible sigmoidoscopy or colonoscopy. You may have a sigmoidoscopy every 5 years or a colonoscopy every 10 years starting at age 24.  Hepatitis C blood test.  Hepatitis B blood test.  Sexually transmitted disease (STD) testing.  Diabetes screening. This is done by checking your blood sugar (glucose) after you have not eaten for a while (fasting). You may have this done every 1-3 years.  Bone density scan. This is done to screen for osteoporosis.  You may have this done starting at age 45.  Mammogram. This may be done every 1-2 years. Talk to your health care provider about how often you should have regular mammograms. Talk with your health care provider about  your test results, treatment options, and if necessary, the need for more tests. Vaccines  Your health care provider may recommend certain vaccines, such as:  Influenza vaccine. This is recommended every year.  Tetanus, diphtheria, and acellular pertussis (Tdap, Td) vaccine. You may need a Td booster every 10 years.  Zoster vaccine. You may need this after age 46.  Pneumococcal 13-valent conjugate (PCV13) vaccine. One dose is recommended after age 45.  Pneumococcal polysaccharide (PPSV23) vaccine. One dose is recommended after age 42. Talk to your health care provider about which screenings and vaccines you need and how often you need them. This information is not intended to replace advice given to you by your health care provider. Make sure you discuss any questions you have with your health care provider. Document Released: 12/27/2015 Document Revised: 08/19/2016 Document Reviewed: 10/01/2015 Elsevier Interactive Patient Education  2017 [place] Prevention in the Home Falls can cause injuries. They can happen to people of all ages. There are many things you can do to make your home safe and to help prevent falls. What can I do on the outside of my home?  Regularly fix the edges of walkways and driveways and fix any cracks.  Remove anything that might make you trip as you walk through a door, such as a raised step or threshold.  Trim any bushes or trees on the path to your home.  Use bright outdoor lighting.  Clear any walking paths of anything that might make someone trip, such as rocks or tools.  Regularly check to see if handrails are loose or broken. Make sure that both sides of any steps have handrails.  Any raised decks and porches should have guardrails on the edges.  Have any leaves, snow, or ice cleared regularly.  Use sand or salt on walking paths during winter.  Clean up any spills in your garage right away. This includes oil or grease spills. What  can I do in the bathroom?  Use night lights.  Install grab bars by the toilet and in the tub and shower. Do not use towel bars as grab bars.  Use non-skid mats or decals in the tub or shower.  If you need to sit down in the shower, use a plastic, non-slip stool.  Keep the floor dry. Clean up any water that spills on the floor as soon as it happens.  Remove soap buildup in the tub or shower regularly.  Attach bath mats securely with double-sided non-slip rug tape.  Do not have throw rugs and other things on the floor that can make you trip. What can I do in the bedroom?  Use night lights.  Make sure that you have a light by your bed that is easy to reach.  Do not use any sheets or blankets that are too big for your bed. They should not hang down onto the floor.  Have a firm chair that has side arms. You can use this for support while you get dressed.  Do not have throw rugs and other things on the floor that can make you trip. What can I do in the kitchen?  Clean up any spills right away.  Avoid walking on wet floors.  Keep items that you use a  lot in easy-to-reach places.  If you need to reach something above you, use a strong step stool that has a grab bar.  Keep electrical cords out of the way.  Do not use floor polish or wax that makes floors slippery. If you must use wax, use non-skid floor wax.  Do not have throw rugs and other things on the floor that can make you trip. What can I do with my stairs?  Do not leave any items on the stairs.  Make sure that there are handrails on both sides of the stairs and use them. Fix handrails that are broken or loose. Make sure that handrails are as long as the stairways.  Check any carpeting to make sure that it is firmly attached to the stairs. Fix any carpet that is loose or worn.  Avoid having throw rugs at the top or bottom of the stairs. If you do have throw rugs, attach them to the floor with carpet tape.  Make sure  that you have a light switch at the top of the stairs and the bottom of the stairs. If you do not have them, ask someone to add them for you. What else can I do to help prevent falls?  Wear shoes that:  Do not have high heels.  Have rubber bottoms.  Are comfortable and fit you well.  Are closed at the toe. Do not wear sandals.  If you use a stepladder:  Make sure that it is fully opened. Do not climb a closed stepladder.  Make sure that both sides of the stepladder are locked into place.  Ask someone to hold it for you, if possible.  Clearly mark and make sure that you can see:  Any grab bars or handrails.  First and last steps.  Where the edge of each step is.  Use tools that help you move around (mobility aids) if they are needed. These include:  Canes.  Walkers.  Scooters.  Crutches.  Turn on the lights when you go into a dark area. Replace any light bulbs as soon as they burn out.  Set up your furniture so you have a clear path. Avoid moving your furniture around.  If any of your floors are uneven, fix them.  If there are any pets around you, be aware of where they are.  Review your medicines with your doctor. Some medicines can make you feel dizzy. This can increase your chance of falling. Ask your doctor what other things that you can do to help prevent falls. This information is not intended to replace advice given to you by your health care provider. Make sure you discuss any questions you have with your health care provider. Document Released: 09/26/2009 Document Revised: 05/07/2016 Document Reviewed: 01/04/2015 Elsevier Interactive Patient Education  2017 [employer].

## 2017-11-17 ENCOUNTER — Encounter: Payer: Self-pay | Admitting: Family Medicine

## 2017-11-17 ENCOUNTER — Ambulatory Visit (INDEPENDENT_AMBULATORY_CARE_PROVIDER_SITE_OTHER): Payer: Medicare Other | Admitting: Family Medicine

## 2017-11-17 VITALS — BP 108/62 | HR 68 | Ht 65.0 in | Wt 172.0 lb

## 2017-11-17 DIAGNOSIS — M5441 Lumbago with sciatica, right side: Secondary | ICD-10-CM | POA: Diagnosis not present

## 2017-11-17 DIAGNOSIS — Z0000 Encounter for general adult medical examination without abnormal findings: Secondary | ICD-10-CM

## 2017-11-17 NOTE — Progress Notes (Signed)
Name: Nicole Cook   MRN: [id number]    DOB: 09-15-1951   Date:11/17/2017       Progress Note  Subjective  Chief Complaint  Chief Complaint  Patient presents with  . Annual Exam    breast exam- mammo not due til April    Patient presents for annual physical exam.    No problem-specific Assessment & Plan notes found for this encounter.   Past Medical History:  Diagnosis Date  . Allergy   . Cancer ([place])    basal cell skin ca  . Diabetes mellitus without complication ([place])   . GERD (gastroesophageal reflux disease)   . Hyperlipidemia   . Hypertension   . Migraine     Past Surgical History:  Procedure Laterality Date  . back surgery x 2    . c-section x 3    . COLONOSCOPY  2011   cleared for 5 yrs- Dr Beckey Downing  . KNEE ARTHROSCOPY Right 2010  . VAGINAL HYSTERECTOMY  12/14/1994    Family History  Problem Relation Age of Onset  . Breast cancer Mother 12  . Breast cancer Sister 20  . Breast cancer Paternal Aunt     Social History   Socioeconomic History  . Marital status: Married    Spouse name: Not on file  . Number of children: 3  . Years of education: Not on file  . Highest education level: Not on file  Social Needs  . Financial resource strain: Not hard at all  . Food insecurity - worry: Never true  . Food insecurity - inability: Never true  . Transportation needs - medical: No  . Transportation needs - non-medical: No  Occupational History  . Occupation: Retired  Tobacco Use  . Smoking status: Never Smoker  . Smokeless tobacco: Never Used  Substance and Sexual Activity  . Alcohol use: No    Alcohol/week: 0.0 oz  . Drug use: No  . Sexual activity: Yes  Other Topics Concern  . Not on file  Social History Narrative  . Not on file    Allergies  Allergen Reactions  . Aspartame Other (See Comments)  . Compazine [Prochlorperazine]   . Penicillins   . Sulfa Antibiotics   . Sulfamethoxazole-Trimethoprim Hives and Other (See Comments)     Other Reaction: Not Assessed     Outpatient Medications Prior to Visit  Medication Sig Dispense Refill  . amLODipine (NORVASC) 5 MG tablet TAKE 1 TABLET DAILY 90 tablet 1  . aspirin 81 MG tablet Take 1 tablet by mouth daily.    Marland Kitchen BLOOD GLUCOSE MONITORING SUPPL 1 application daily.    . cetirizine (ZYRTEC) 10 MG tablet Take 10 mg by mouth daily. otc    . Cholecalciferol (VITAMIN D3) 1000 units CAPS Take 1 capsule by mouth daily.    . Coenzyme Q10 (EQL COQ10) 300 MG CAPS Take 1 capsule by mouth daily.    . Dulaglutide (TRULICITY) 8.25 KN/3.9JQ SOPN Inject into the skin. Dr Eddie Dibbles    . estradiol (ESTRING) 2 MG vaginal ring Place 2 mg vaginally every 3 (three) months. follow package directions- GYN    . fluticasone (FLONASE) 50 MCG/ACT nasal spray Place 2 sprays into both nostrils at bedtime. 16 g 1  . gabapentin (NEURONTIN) 300 MG capsule Take 2 capsules by mouth 3 (three) times daily. [facility]- may take 3 capsules 3 times per day per pt    . hydrochlorothiazide (HYDRODIURIL) 25 MG tablet TAKE 1 TABLET EVERY MORNING 90 tablet 1  .  HYDROcodone-acetaminophen (NORCO) 10-325 MG per tablet Take 1 tablet by mouth as needed. [facility]    . Lancets (ACCU-CHEK MULTICLIX) lancets USE 1 LANCET DAILY 102 each 0  . Lido-Capsaicin-Men-Methyl Sal 0.5-0.035-5-20 % PTCH Apply 1 application topically as needed. [facility]    . lisinopril (PRINIVIL,ZESTRIL) 10 MG tablet Take 1 tablet (10 mg total) by mouth daily. 90 tablet 1  . Magnesium 500 MG CAPS Take 1 capsule by mouth daily.    . metFORMIN (GLUCOPHAGE) 500 MG tablet Take 1,000 mg by mouth 2 (two) times daily. Dr Eddie Dibbles    . metoprolol succinate (TOPROL-XL) 25 MG 24 hr tablet Take 1 tablet by mouth daily. Cardio- takes with 50mg = 75    . metoprolol succinate (TOPROL-XL) 50 MG 24 hr tablet TAKE 1 TABLET DAILY WITH OR IMMEDIATELY FOLLOWING A MEAL 30 tablet 0  . montelukast (SINGULAIR) 10 MG tablet Take 1 tablet (10 mg total) by mouth daily. 90 tablet 1  . Multiple  Vitamins-Minerals (CENTRUM SILVER ULTRA WOMENS PO) Take 1 tablet by mouth daily.    . nitroGLYCERIN (NITROSTAT) 0.4 MG SL tablet Place 1 tablet under the tongue as needed.    . OXYCONTIN 10 MG 12 hr tablet Take 10 mg by mouth 2 (two) times daily. Pain Dr  0  . pantoprazole (PROTONIX) 40 MG tablet Take 1 tablet (40 mg total) by mouth daily. 90 tablet 1  . rosuvastatin (CRESTOR) 20 MG tablet Take 1 tablet (20 mg total) by mouth daily. Dr Chryl Heck 90 tablet 1  . SUMAtriptan (IMITREX) 50 MG tablet Take 1 tablet (50 mg total) by mouth as needed. 10 tablet 1  . tiZANidine (ZANAFLEX) 4 MG capsule Take 1 capsule by mouth as needed. [facility]    . vitamin B-12 (CYANOCOBALAMIN) 1000 MCG tablet Take 1,000 mcg by mouth daily.     No facility-administered medications prior to visit.     Review of Systems  Constitutional: Negative for chills, fever, malaise/fatigue and weight loss.  HENT: Negative for ear discharge, ear pain and sore throat.   Eyes: Negative for blurred vision.  Respiratory: Negative for cough, sputum production, shortness of breath and wheezing.   Cardiovascular: Negative for chest pain, palpitations and leg swelling.  Gastrointestinal: Negative for abdominal pain, blood in stool, constipation, diarrhea, heartburn, melena and nausea.  Genitourinary: Negative for dysuria, frequency, hematuria and urgency.  Musculoskeletal: Positive for back pain. Negative for joint pain, myalgias and neck pain.  Skin: Negative for rash.  Neurological: Negative for dizziness, tingling, sensory change, focal weakness and headaches.  Endo/Heme/Allergies: Negative for environmental allergies and polydipsia. Does not bruise/bleed easily.  Psychiatric/Behavioral: Negative for depression and suicidal ideas. The patient is not nervous/anxious and does not have insomnia.      Objective  Vitals:   11/17/17 0952  BP: 108/62  Pulse: 68  Weight: 172 lb (78 kg)  Height: 5\' 5"  (1.651 m)    Physical Exam   Constitutional: She is oriented to person, place, and time and well-developed, well-nourished, and in no distress. Vital signs are normal. No distress.  HENT:  Head: Normocephalic and atraumatic.  Right Ear: Tympanic membrane, external ear and ear canal normal.  Left Ear: Tympanic membrane, external ear and ear canal normal.  Nose: Nose normal.  Mouth/Throat: Uvula is midline, oropharynx is clear and moist and mucous membranes are normal.  Eyes: Conjunctivae and EOM are normal. Pupils are equal, round, and reactive to light. Right eye exhibits no discharge. Left eye exhibits no discharge.  Fundoscopic exam:      The right eye shows no arteriolar narrowing and no AV nicking.       The left eye shows no arteriolar narrowing and no AV nicking.  Neck: Normal range of motion. Neck supple. Normal carotid pulses, no hepatojugular reflux and no JVD present. Carotid bruit is not present. No thyromegaly present.  Cardiovascular: Normal rate, regular rhythm, normal heart sounds, intact distal pulses and normal pulses. PMI is not displaced. Exam reveals no gallop and no friction rub.  No murmur heard. Pulmonary/Chest: Effort normal and breath sounds normal. She has no wheezes. She has no rales. Right breast exhibits inverted nipple. Right breast exhibits no mass, no nipple discharge, no skin change and no tenderness. Left breast exhibits inverted nipple. Left breast exhibits no mass, no nipple discharge, no skin change and no tenderness. Breasts are symmetrical.  Abdominal: Soft. Normal aorta and bowel sounds are normal. She exhibits no mass. There is no hepatosplenomegaly. There is no tenderness. There is no rebound, no guarding and no CVA tenderness.  Musculoskeletal: Normal range of motion. She exhibits no edema.  Lymphadenopathy:       Head (right side): No submental and no submandibular adenopathy present.       Head (left side): No submental and no submandibular adenopathy present.    She has no  cervical adenopathy.    She has no axillary adenopathy.  Neurological: She is alert and oriented to person, place, and time. She has normal sensation, normal strength, normal reflexes and intact cranial nerves. No sensory deficit.  Monofilament normal  Skin: Skin is warm, dry and intact. She is not diaphoretic.  Psychiatric: Mood and affect normal.  Nursing note and vitals reviewed.     Assessment & Plan  Problem List Items Addressed This Visit    None    Visit Diagnoses    Annual physical exam    -  Primary      No orders of the defined types were placed in this encounter.     Dr. [facility]  11/17/17

## 2017-11-23 DIAGNOSIS — M5441 Lumbago with sciatica, right side: Secondary | ICD-10-CM | POA: Diagnosis not present

## 2017-11-25 DIAGNOSIS — Z01419 Encounter for gynecological examination (general) (routine) without abnormal findings: Secondary | ICD-10-CM | POA: Diagnosis not present

## 2017-11-25 DIAGNOSIS — Z6829 Body mass index (BMI) 29.0-29.9, adult: Secondary | ICD-10-CM | POA: Diagnosis not present

## 2017-11-25 DIAGNOSIS — N952 Postmenopausal atrophic vaginitis: Secondary | ICD-10-CM | POA: Diagnosis not present

## 2017-11-30 DIAGNOSIS — E538 Deficiency of other specified B group vitamins: Secondary | ICD-10-CM | POA: Diagnosis not present

## 2017-11-30 DIAGNOSIS — E119 Type 2 diabetes mellitus without complications: Secondary | ICD-10-CM | POA: Diagnosis not present

## 2017-11-30 DIAGNOSIS — E785 Hyperlipidemia, unspecified: Secondary | ICD-10-CM | POA: Diagnosis not present

## 2017-11-30 DIAGNOSIS — M5441 Lumbago with sciatica, right side: Secondary | ICD-10-CM | POA: Diagnosis not present

## 2017-12-03 DIAGNOSIS — M5441 Lumbago with sciatica, right side: Secondary | ICD-10-CM | POA: Diagnosis not present

## 2017-12-13 DIAGNOSIS — M5441 Lumbago with sciatica, right side: Secondary | ICD-10-CM | POA: Diagnosis not present

## 2017-12-16 DIAGNOSIS — M5441 Lumbago with sciatica, right side: Secondary | ICD-10-CM | POA: Diagnosis not present

## 2017-12-20 DIAGNOSIS — M5441 Lumbago with sciatica, right side: Secondary | ICD-10-CM | POA: Diagnosis not present

## 2017-12-22 DIAGNOSIS — M5441 Lumbago with sciatica, right side: Secondary | ICD-10-CM | POA: Diagnosis not present

## 2017-12-27 DIAGNOSIS — M5441 Lumbago with sciatica, right side: Secondary | ICD-10-CM | POA: Diagnosis not present

## 2017-12-29 DIAGNOSIS — M5441 Lumbago with sciatica, right side: Secondary | ICD-10-CM | POA: Diagnosis not present

## 2018-01-03 DIAGNOSIS — M5441 Lumbago with sciatica, right side: Secondary | ICD-10-CM | POA: Diagnosis not present

## 2018-01-12 DIAGNOSIS — M5441 Lumbago with sciatica, right side: Secondary | ICD-10-CM | POA: Diagnosis not present

## 2018-01-18 DIAGNOSIS — Z7982 Long term (current) use of aspirin: Secondary | ICD-10-CM | POA: Diagnosis not present

## 2018-01-18 DIAGNOSIS — I2510 Atherosclerotic heart disease of native coronary artery without angina pectoris: Secondary | ICD-10-CM | POA: Diagnosis not present

## 2018-01-18 DIAGNOSIS — G4730 Sleep apnea, unspecified: Secondary | ICD-10-CM | POA: Diagnosis not present

## 2018-01-18 DIAGNOSIS — K635 Polyp of colon: Secondary | ICD-10-CM | POA: Diagnosis not present

## 2018-01-18 DIAGNOSIS — K5730 Diverticulosis of large intestine without perforation or abscess without bleeding: Secondary | ICD-10-CM | POA: Diagnosis not present

## 2018-01-18 DIAGNOSIS — Z86010 Personal history of colonic polyps: Secondary | ICD-10-CM | POA: Diagnosis not present

## 2018-01-18 DIAGNOSIS — Z1211 Encounter for screening for malignant neoplasm of colon: Secondary | ICD-10-CM | POA: Diagnosis not present

## 2018-01-18 DIAGNOSIS — Z794 Long term (current) use of insulin: Secondary | ICD-10-CM | POA: Diagnosis not present

## 2018-01-18 DIAGNOSIS — D123 Benign neoplasm of transverse colon: Secondary | ICD-10-CM | POA: Diagnosis not present

## 2018-01-18 DIAGNOSIS — Z79899 Other long term (current) drug therapy: Secondary | ICD-10-CM | POA: Diagnosis not present

## 2018-01-18 DIAGNOSIS — Z800 Family history of malignant neoplasm of digestive organs: Secondary | ICD-10-CM | POA: Diagnosis not present

## 2018-01-18 DIAGNOSIS — E119 Type 2 diabetes mellitus without complications: Secondary | ICD-10-CM | POA: Diagnosis not present

## 2018-01-18 DIAGNOSIS — K219 Gastro-esophageal reflux disease without esophagitis: Secondary | ICD-10-CM | POA: Diagnosis not present

## 2018-01-18 DIAGNOSIS — E785 Hyperlipidemia, unspecified: Secondary | ICD-10-CM | POA: Diagnosis not present

## 2018-01-18 DIAGNOSIS — I10 Essential (primary) hypertension: Secondary | ICD-10-CM | POA: Diagnosis not present

## 2018-01-18 DIAGNOSIS — I252 Old myocardial infarction: Secondary | ICD-10-CM | POA: Diagnosis not present

## 2018-01-24 LAB — HM COLONOSCOPY

## 2018-01-25 ENCOUNTER — Other Ambulatory Visit: Payer: Self-pay

## 2018-01-25 MED ORDER — PROMETHAZINE HCL 25 MG RE SUPP: 25.0000 mg | Freq: Four times a day (QID) | RECTAL | 0 refills | Status: DC | PRN

## 2018-01-28 ENCOUNTER — Other Ambulatory Visit: Payer: Self-pay

## 2018-03-09 ENCOUNTER — Other Ambulatory Visit: Payer: Self-pay | Admitting: Family Medicine

## 2018-03-09 ENCOUNTER — Ambulatory Visit (INDEPENDENT_AMBULATORY_CARE_PROVIDER_SITE_OTHER): Payer: Medicare Other | Admitting: Family Medicine

## 2018-03-09 ENCOUNTER — Encounter: Payer: Self-pay | Admitting: Family Medicine

## 2018-03-09 VITALS — BP 100/62 | HR 80 | Ht 65.0 in | Wt 172.0 lb

## 2018-03-09 DIAGNOSIS — K219 Gastro-esophageal reflux disease without esophagitis: Secondary | ICD-10-CM

## 2018-03-09 DIAGNOSIS — E119 Type 2 diabetes mellitus without complications: Secondary | ICD-10-CM | POA: Diagnosis not present

## 2018-03-09 DIAGNOSIS — I10 Essential (primary) hypertension: Secondary | ICD-10-CM | POA: Diagnosis not present

## 2018-03-09 DIAGNOSIS — E785 Hyperlipidemia, unspecified: Secondary | ICD-10-CM

## 2018-03-09 DIAGNOSIS — J301 Allergic rhinitis due to pollen: Secondary | ICD-10-CM | POA: Diagnosis not present

## 2018-03-09 DIAGNOSIS — D649 Anemia, unspecified: Secondary | ICD-10-CM | POA: Diagnosis not present

## 2018-03-09 DIAGNOSIS — Z1231 Encounter for screening mammogram for malignant neoplasm of breast: Secondary | ICD-10-CM

## 2018-03-09 DIAGNOSIS — G43109 Migraine with aura, not intractable, without status migrainosus: Secondary | ICD-10-CM | POA: Diagnosis not present

## 2018-03-09 MED ORDER — FLUTICASONE PROPIONATE 50 MCG/ACT NA SUSP: 2.0000 | Freq: Every day | NASAL | 1 refills | Status: DC

## 2018-03-09 MED ORDER — METOPROLOL SUCCINATE ER 50 MG PO TB24: ORAL_TABLET | ORAL | 1 refills | Status: DC

## 2018-03-09 MED ORDER — SUMATRIPTAN SUCCINATE 50 MG PO TABS: 50.0000 mg | ORAL_TABLET | ORAL | 1 refills | Status: DC | PRN

## 2018-03-09 MED ORDER — MONTELUKAST SODIUM 10 MG PO TABS: 10.0000 mg | ORAL_TABLET | Freq: Every day | ORAL | 1 refills | Status: DC

## 2018-03-09 MED ORDER — LISINOPRIL 10 MG PO TABS: 10.0000 mg | ORAL_TABLET | Freq: Every day | ORAL | 1 refills | Status: DC

## 2018-03-09 MED ORDER — HYDROCHLOROTHIAZIDE 25 MG PO TABS: ORAL_TABLET | ORAL | 1 refills | Status: DC

## 2018-03-09 MED ORDER — AMLODIPINE BESYLATE 5 MG PO TABS: ORAL_TABLET | ORAL | 1 refills | Status: DC

## 2018-03-09 MED ORDER — PANTOPRAZOLE SODIUM 40 MG PO TBEC: 40.0000 mg | DELAYED_RELEASE_TABLET | Freq: Every day | ORAL | 1 refills | Status: DC

## 2018-03-09 MED ORDER — ROSUVASTATIN CALCIUM 20 MG PO TABS: 20.0000 mg | ORAL_TABLET | Freq: Every day | ORAL | 1 refills | Status: DC

## 2018-03-09 NOTE — Progress Notes (Signed)
Name: Nicole Cook   MRN: [id number]    DOB: Oct 27, 1951   Date:03/09/2018       Progress Note  Subjective  Chief Complaint  Chief Complaint  Patient presents with  . Hypertension  . Gastroesophageal Reflux  . Allergic Rhinitis   . Asthma    Hypertension  This is a chronic problem. The current episode started more than 1 year ago. The problem is unchanged. The problem is controlled. Pertinent negatives include no anxiety, blurred vision, chest pain, headaches, malaise/fatigue, neck pain, orthopnea, palpitations, peripheral edema or shortness of breath. There are no associated agents to hypertension. Risk factors for coronary artery disease include dyslipidemia and obesity. Past treatments include ACE inhibitors, calcium channel blockers and diuretics. The current treatment provides moderate improvement. There are no compliance problems.  There is no history of angina, kidney disease, CAD/MI, CVA, heart failure, left ventricular hypertrophy, PVD or retinopathy. There is no history of chronic renal disease, a hypertension causing med or renovascular disease.  Gastroesophageal Reflux  She reports no abdominal pain, no belching, no chest pain, no choking, no coughing, no dysphagia, no early satiety, no heartburn, no nausea, no sore throat, no stridor or no wheezing. This is a chronic problem. The current episode started more than 1 year ago. The problem occurs rarely. The problem has been gradually improving. The heartburn is of mild intensity. The heartburn does not wake her from sleep. The heartburn does not limit her activity. The heartburn doesn't change with position. The symptoms are aggravated by certain foods. Pertinent negatives include no anemia, fatigue, melena, muscle weakness or weight loss. She has tried a PPI for the symptoms. The treatment provided moderate relief.  Migraine   This is a chronic (for migraine prophyllaxis) problem. The current episode started more than 1 year ago.  The problem occurs intermittently. The problem has been waxing and waning. Pain location: R>L. The quality of the pain is described as throbbing. The pain is mild. Pertinent negatives include no abdominal pain, back pain, blurred vision, coughing, dizziness, ear pain, fever, insomnia, nausea, neck pain, sore throat, tingling or weight loss. Treatments tried: imetrix. The treatment provided moderate relief. Her past medical history is significant for hypertension.    No problem-specific Assessment & Plan notes found for this encounter.   Past Medical History:  Diagnosis Date  . Allergy   . Cancer ([place])    basal cell skin ca  . Diabetes mellitus without complication ([place])   . GERD (gastroesophageal reflux disease)   . Hyperlipidemia   . Hypertension   . Migraine     Past Surgical History:  Procedure Laterality Date  . back surgery x 2    . c-section x 3    . COLONOSCOPY  2011   cleared for 5 yrs- Dr Beckey Downing  . KNEE ARTHROSCOPY Right 2010  . VAGINAL HYSTERECTOMY  12/14/1994    Family History  Problem Relation Age of Onset  . Breast cancer Mother 55  . Breast cancer Sister 34  . Breast cancer Paternal Aunt     Social History   Socioeconomic History  . Marital status: Married    Spouse name: Not on file  . Number of children: 3  . Years of education: Not on file  . Highest education level: Not on file  Occupational History  . Occupation: Retired  Scientific laboratory technician  . Financial resource strain: Not hard at all  . Food insecurity:    Worry: Never true    Inability: Never  true  . Transportation needs:    Medical: No    Non-medical: No  Tobacco Use  . Smoking status: Never Smoker  . Smokeless tobacco: Never Used  Substance and Sexual Activity  . Alcohol use: No    Alcohol/week: 0.0 oz  . Drug use: No  . Sexual activity: Yes  Lifestyle  . Physical activity:    Days per week: 6 days    Minutes per session: 60 min  . Stress: Not on file  Relationships  . Social  connections:    Talks on phone: More than three times a week    Gets together: Three times a week    Attends religious service: More than 4 times per year    Active member of club or organization: No    Attends meetings of clubs or organizations: Never    Relationship status: Married  . Intimate partner violence:    Fear of current or ex partner: No    Emotionally abused: No    Physically abused: No    Forced sexual activity: No  Other Topics Concern  . Not on file  Social History Narrative  . Not on file    Allergies  Allergen Reactions  . Aspartame Other (See Comments)  . Compazine [Prochlorperazine]   . Penicillins   . Sulfa Antibiotics   . Sulfamethoxazole-Trimethoprim Hives and Other (See Comments)    Other Reaction: Not Assessed     Outpatient Medications Prior to Visit  Medication Sig Dispense Refill  . aspirin 81 MG tablet Take 1 tablet by mouth daily.    Marland Kitchen BLOOD GLUCOSE MONITORING SUPPL 1 application daily.    . cetirizine (ZYRTEC) 10 MG tablet Take 10 mg by mouth daily. otc    . Cholecalciferol (VITAMIN D3) 1000 units CAPS Take 1 capsule by mouth daily.    . Coenzyme Q10 (EQL COQ10) 300 MG CAPS Take 1 capsule by mouth daily.    . Dulaglutide (TRULICITY) 1.60 FU/9.3AT SOPN Inject into the skin. Dr Eddie Dibbles    . estradiol (ESTRING) 2 MG vaginal ring Place 2 mg vaginally every 3 (three) months. follow package directions- GYN    . gabapentin (NEURONTIN) 300 MG capsule Take 2 capsules by mouth 3 (three) times daily. [facility]- may take 3 capsules 3 times per day per pt    . HYDROcodone-acetaminophen (NORCO) 10-325 MG per tablet Take 1 tablet by mouth as needed. [facility]    . Lancets (ACCU-CHEK MULTICLIX) lancets USE 1 LANCET DAILY 102 each 0  . Lido-Capsaicin-Men-Methyl Sal 0.5-0.035-5-20 % PTCH Apply 1 application topically as needed. [facility]    . Magnesium 500 MG CAPS Take 1 capsule by mouth daily.    . metFORMIN (GLUCOPHAGE) 500 MG tablet Take 1,000 mg by mouth 2 (two)  times daily. Dr Eddie Dibbles    . metoprolol succinate (TOPROL-XL) 25 MG 24 hr tablet Take 1 tablet by mouth daily. Cardio- takes with 50mg = 75    . Multiple Vitamins-Minerals (CENTRUM SILVER ULTRA WOMENS PO) Take 1 tablet by mouth daily.    . nitroGLYCERIN (NITROSTAT) 0.4 MG SL tablet Place 1 tablet under the tongue as needed.    . OXYCONTIN 10 MG 12 hr tablet Take 10 mg by mouth 2 (two) times daily. Pain Dr  0  . promethazine (PHENERGAN) 25 MG suppository Place 1 suppository (25 mg total) rectally every 6 (six) hours as needed for nausea or vomiting. Pt says she has "had promethazine several times and has been fine" 12 each  0  . tiZANidine (ZANAFLEX) 4 MG capsule Take 1 capsule by mouth as needed. [facility]    . vitamin B-12 (CYANOCOBALAMIN) 1000 MCG tablet Take 1,000 mcg by mouth daily.    Marland Kitchen amLODipine (NORVASC) 5 MG tablet TAKE 1 TABLET DAILY 90 tablet 1  . fluticasone (FLONASE) 50 MCG/ACT nasal spray Place 2 sprays into both nostrils at bedtime. 16 g 1  . hydrochlorothiazide (HYDRODIURIL) 25 MG tablet TAKE 1 TABLET EVERY MORNING 90 tablet 1  . lisinopril (PRINIVIL,ZESTRIL) 10 MG tablet Take 1 tablet (10 mg total) by mouth daily. 90 tablet 1  . metoprolol succinate (TOPROL-XL) 50 MG 24 hr tablet TAKE 1 TABLET DAILY WITH OR IMMEDIATELY FOLLOWING A MEAL 30 tablet 0  . montelukast (SINGULAIR) 10 MG tablet Take 1 tablet (10 mg total) by mouth daily. 90 tablet 1  . pantoprazole (PROTONIX) 40 MG tablet Take 1 tablet (40 mg total) by mouth daily. 90 tablet 1  . rosuvastatin (CRESTOR) 20 MG tablet Take 1 tablet (20 mg total) by mouth daily. Dr Chryl Heck 90 tablet 1  . SUMAtriptan (IMITREX) 50 MG tablet Take 1 tablet (50 mg total) by mouth as needed. 10 tablet 1   No facility-administered medications prior to visit.     Review of Systems  Constitutional: Negative for chills, fatigue, fever, malaise/fatigue and weight loss.  HENT: Negative for ear discharge, ear pain and sore throat.   Eyes: Negative for  blurred vision.  Respiratory: Negative for cough, sputum production, choking, shortness of breath and wheezing.   Cardiovascular: Negative for chest pain, palpitations, orthopnea and leg swelling.  Gastrointestinal: Negative for abdominal pain, blood in stool, constipation, diarrhea, dysphagia, heartburn, melena and nausea.  Genitourinary: Negative for dysuria, frequency, hematuria and urgency.  Musculoskeletal: Negative for back pain, joint pain, myalgias, muscle weakness and neck pain.  Skin: Negative for rash.  Neurological: Negative for dizziness, tingling, sensory change, focal weakness and headaches.  Endo/Heme/Allergies: Negative for environmental allergies and polydipsia. Does not bruise/bleed easily.  Psychiatric/Behavioral: Negative for depression and suicidal ideas. The patient is not nervous/anxious and does not have insomnia.      Objective  Vitals:   03/09/18 0912  BP: 100/62  Pulse: 80  Weight: 172 lb (78 kg)  Height: 5\' 5"  (1.651 m)    Physical Exam  Constitutional: She is well-developed, well-nourished, and in no distress. No distress.  HENT:  Head: Normocephalic and atraumatic.  Right Ear: External ear normal.  Left Ear: External ear normal.  Nose: Nose normal.  Mouth/Throat: Oropharynx is clear and moist.  Eyes: Pupils are equal, round, and reactive to light. Conjunctivae and EOM are normal. Right eye exhibits no discharge. Left eye exhibits no discharge.  Neck: Normal range of motion. Neck supple. No JVD present. No thyromegaly present.  Cardiovascular: Normal rate, regular rhythm, normal heart sounds and intact distal pulses. Exam reveals no gallop and no friction rub.  No murmur heard. Pulmonary/Chest: Effort normal and breath sounds normal. She has no wheezes. She has no rales.  Abdominal: Soft. Bowel sounds are normal. She exhibits no mass. There is no tenderness. There is no guarding.  Musculoskeletal: Normal range of motion. She exhibits no edema.   Lymphadenopathy:    She has no cervical adenopathy.  Neurological: She is alert. She has normal reflexes.  Skin: Skin is warm and dry. She is not diaphoretic.  Psychiatric: Mood and affect normal.  Nursing note and vitals reviewed.     Assessment & Plan  Problem List Items Addressed  This Visit      Cardiovascular and Mediastinum   Essential hypertension - Primary   Relevant Medications   hydrochlorothiazide (HYDRODIURIL) 25 MG tablet   amLODipine (NORVASC) 5 MG tablet   lisinopril (PRINIVIL,ZESTRIL) 10 MG tablet   rosuvastatin (CRESTOR) 20 MG tablet   metoprolol succinate (TOPROL-XL) 50 MG 24 hr tablet   Other Relevant Orders   Renal Function Panel   Migraine with aura and without status migrainosus, not intractable   Relevant Medications   hydrochlorothiazide (HYDRODIURIL) 25 MG tablet   amLODipine (NORVASC) 5 MG tablet   lisinopril (PRINIVIL,ZESTRIL) 10 MG tablet   rosuvastatin (CRESTOR) 20 MG tablet   SUMAtriptan (IMITREX) 50 MG tablet   metoprolol succinate (TOPROL-XL) 50 MG 24 hr tablet     Respiratory   Seasonal allergic rhinitis due to pollen   Relevant Medications   montelukast (SINGULAIR) 10 MG tablet   fluticasone (FLONASE) 50 MCG/ACT nasal spray     Digestive   Gastroesophageal reflux disease   Relevant Medications   pantoprazole (PROTONIX) 40 MG tablet    Other Visit Diagnoses    Hyperlipidemia, unspecified hyperlipidemia type       Relevant Medications   hydrochlorothiazide (HYDRODIURIL) 25 MG tablet   amLODipine (NORVASC) 5 MG tablet   lisinopril (PRINIVIL,ZESTRIL) 10 MG tablet   rosuvastatin (CRESTOR) 20 MG tablet   metoprolol succinate (TOPROL-XL) 50 MG 24 hr tablet   Other Relevant Orders   Lipid panel   Type 2 diabetes mellitus without complication, without long-term current use of insulin ([facility])   (Chronic)     a1c order   Relevant Medications   lisinopril (PRINIVIL,ZESTRIL) 10 MG tablet   rosuvastatin (CRESTOR) 20 MG tablet      Meds  ordered this encounter  Medications  . hydrochlorothiazide (HYDRODIURIL) 25 MG tablet    Sig: TAKE 1 TABLET EVERY MORNING    Dispense:  90 tablet    Refill:  1  . amLODipine (NORVASC) 5 MG tablet    Sig: TAKE 1 TABLET DAILY    Dispense:  90 tablet    Refill:  1  . lisinopril (PRINIVIL,ZESTRIL) 10 MG tablet    Sig: Take 1 tablet (10 mg total) by mouth daily.    Dispense:  90 tablet    Refill:  1  . pantoprazole (PROTONIX) 40 MG tablet    Sig: Take 1 tablet (40 mg total) by mouth daily.    Dispense:  90 tablet    Refill:  1  . rosuvastatin (CRESTOR) 20 MG tablet    Sig: Take 1 tablet (20 mg total) by mouth daily. Dr Chryl Heck    Dispense:  90 tablet    Refill:  1  . SUMAtriptan (IMITREX) 50 MG tablet    Sig: Take 1 tablet (50 mg total) by mouth as needed.    Dispense:  10 tablet    Refill:  1  . montelukast (SINGULAIR) 10 MG tablet    Sig: Take 1 tablet (10 mg total) by mouth daily.    Dispense:  90 tablet    Refill:  1  . fluticasone (FLONASE) 50 MCG/ACT nasal spray    Sig: Place 2 sprays into both nostrils at bedtime.    Dispense:  16 g    Refill:  1    Pt wants a 90 days supply  . metoprolol succinate (TOPROL-XL) 50 MG 24 hr tablet    Sig: TAKE 1 TABLET DAILY WITH OR IMMEDIATELY FOLLOWING A MEAL    Dispense:  90  tablet    Refill:  1      Dr. [facility]  03/09/18

## 2018-03-10 LAB — RENAL FUNCTION PANEL
Albumin: 4.8 g/dL (ref 3.6–4.8)
BUN/Creatinine Ratio: 18 (ref 12–28)
BUN: 12 mg/dL (ref 8–27)
CO2: 26 mmol/L (ref 20–29)
Calcium: 9.7 mg/dL (ref 8.7–10.3)
Chloride: 97 mmol/L (ref 96–106)
Creatinine, Ser: 0.68 mg/dL (ref 0.57–1.00)
GFR calc Af Amer: 104 mL/min/{1.73_m2} (ref >59)
GFR calc non Af Amer: 90 mL/min/{1.73_m2} (ref >59)
Glucose: 130 mg/dL — ABNORMAL HIGH (ref 65–99)
Phosphorus: 2.9 mg/dL (ref 2.5–4.5)
Potassium: 4.2 mmol/L (ref 3.5–5.2)
Sodium: 139 mmol/L (ref 134–144)

## 2018-03-10 LAB — LIPID PANEL
Chol/HDL Ratio: 3.3 ratio (ref 0.0–4.4)
Cholesterol, Total: 130 mg/dL (ref 100–199)
HDL: 39 mg/dL — ABNORMAL LOW (ref >39)
LDL Calculated: 60 mg/dL (ref 0–99)
Triglycerides: 156 mg/dL — ABNORMAL HIGH (ref 0–149)
VLDL Cholesterol Cal: 31 mg/dL (ref 5–40)

## 2018-03-10 LAB — HGB A1C W/O EAG: Hgb A1c MFr Bld: 6.7 % — ABNORMAL HIGH (ref 4.8–5.6)

## 2018-03-21 ENCOUNTER — Encounter: Payer: Self-pay | Admitting: Family Medicine

## 2018-03-21 ENCOUNTER — Ambulatory Visit (INDEPENDENT_AMBULATORY_CARE_PROVIDER_SITE_OTHER): Payer: Medicare Other | Admitting: Family Medicine

## 2018-03-21 VITALS — BP 120/64 | HR 80 | Ht 65.0 in | Wt 174.0 lb

## 2018-03-21 DIAGNOSIS — J0100 Acute maxillary sinusitis, unspecified: Secondary | ICD-10-CM | POA: Diagnosis not present

## 2018-03-21 DIAGNOSIS — H1033 Unspecified acute conjunctivitis, bilateral: Secondary | ICD-10-CM

## 2018-03-21 DIAGNOSIS — J40 Bronchitis, not specified as acute or chronic: Secondary | ICD-10-CM

## 2018-03-21 MED ORDER — BENZONATATE 100 MG PO CAPS: 100.0000 mg | ORAL_CAPSULE | Freq: Two times a day (BID) | ORAL | 0 refills | Status: DC | PRN

## 2018-03-21 MED ORDER — OFLOXACIN 0.3 % OP SOLN: 1.0000 [drp] | Freq: Four times a day (QID) | OPHTHALMIC | 0 refills | Status: DC

## 2018-03-21 MED ORDER — AZITHROMYCIN 250 MG PO TABS: ORAL_TABLET | ORAL | 0 refills | Status: DC

## 2018-03-21 NOTE — Progress Notes (Signed)
Name: Nicole Cook   MRN: [id number]    DOB: 1951/01/02   Date:03/21/2018       Progress Note  Subjective  Chief Complaint  Chief Complaint  Patient presents with  . Eye Drainage    woke up with matted eyes, red sore    Cough  This is a new problem. Episode onset: 8 days ago. The problem has been waxing and waning. The problem occurs every few minutes. The cough is productive of purulent sputum ("grayish"). Associated symptoms include ear congestion, ear pain, eye redness, headaches, nasal congestion, postnasal drip, rhinorrhea and a sore throat. Pertinent negatives include no chest pain, chills, fever, heartburn, hemoptysis, myalgias, rash, shortness of breath, sweats, weight loss or wheezing. The symptoms are aggravated by pollens. She has tried OTC cough suppressant for the symptoms. The treatment provided moderate relief. There is no history of asthma, bronchiectasis, bronchitis, COPD, emphysema, environmental allergies or pneumonia.  Sinusitis  This is a new problem. The current episode started 1 to 4 weeks ago (8 days). The problem has been gradually worsening since onset. There has been no fever. Associated symptoms include congestion, coughing, ear pain, headaches, a hoarse voice, neck pain, sinus pressure, sneezing and a sore throat. Pertinent negatives include no chills, diaphoresis, shortness of breath or swollen glands. (Yellow/green nasal discharge)  Conjunctivitis   The current episode started 3 to 5 days ago (friday evening). The onset was gradual. The problem occurs occasionally. The problem has been gradually worsening. Nothing relieves the symptoms. Associated symptoms include eye itching, congestion, ear pain, headaches, rhinorrhea, sore throat, neck pain, cough, eye discharge and eye redness. Pertinent negatives include no fever, no decreased vision, no double vision, no abdominal pain, no constipation, no diarrhea, no nausea, no ear discharge, no hearing loss, no mouth sores,  no stridor, no swollen glands, no wheezing, no rash and no eye pain.    No problem-specific Assessment & Plan notes found for this encounter.   Past Medical History:  Diagnosis Date  . Allergy   . Cancer ([place])    basal cell skin ca  . Diabetes mellitus without complication ([place])   . GERD (gastroesophageal reflux disease)   . Hyperlipidemia   . Hypertension   . Migraine     Past Surgical History:  Procedure Laterality Date  . back surgery x 2    . c-section x 3    . COLONOSCOPY  2011   cleared for 5 yrs- Dr Beckey Downing  . KNEE ARTHROSCOPY Right 2010  . VAGINAL HYSTERECTOMY  12/14/1994    Family History  Problem Relation Age of Onset  . Breast cancer Mother 19  . Breast cancer Sister 59  . Breast cancer Paternal Aunt     Social History   Socioeconomic History  . Marital status: Married    Spouse name: Not on file  . Number of children: 3  . Years of education: Not on file  . Highest education level: Not on file  Occupational History  . Occupation: Retired  Scientific laboratory technician  . Financial resource strain: Not hard at all  . Food insecurity:    Worry: Never true    Inability: Never true  . Transportation needs:    Medical: No    Non-medical: No  Tobacco Use  . Smoking status: Never Smoker  . Smokeless tobacco: Never Used  Substance and Sexual Activity  . Alcohol use: No    Alcohol/week: 0.0 oz  . Drug use: No  . Sexual activity: Yes  Lifestyle  . Physical activity:    Days per week: 6 days    Minutes per session: 60 min  . Stress: Not on file  Relationships  . Social connections:    Talks on phone: More than three times a week    Gets together: Three times a week    Attends religious service: More than 4 times per year    Active member of club or organization: No    Attends meetings of clubs or organizations: Never    Relationship status: Married  . Intimate partner violence:    Fear of current or ex partner: No    Emotionally abused: No    Physically  abused: No    Forced sexual activity: No  Other Topics Concern  . Not on file  Social History Narrative  . Not on file    Allergies  Allergen Reactions  . Aspartame Other (See Comments)  . Compazine [Prochlorperazine]   . Penicillins   . Sulfa Antibiotics   . Sulfamethoxazole-Trimethoprim Hives and Other (See Comments)    Other Reaction: Not Assessed     Outpatient Medications Prior to Visit  Medication Sig Dispense Refill  . amLODipine (NORVASC) 5 MG tablet TAKE 1 TABLET DAILY 90 tablet 1  . aspirin 81 MG tablet Take 1 tablet by mouth daily.    Marland Kitchen BLOOD GLUCOSE MONITORING SUPPL 1 application daily.    . cetirizine (ZYRTEC) 10 MG tablet Take 10 mg by mouth daily. otc    . Cholecalciferol (VITAMIN D3) 1000 units CAPS Take 1 capsule by mouth daily.    . Coenzyme Q10 (EQL COQ10) 300 MG CAPS Take 1 capsule by mouth daily.    . Dulaglutide (TRULICITY) 9.93 ZJ/6.9CV SOPN Inject into the skin. Dr Eddie Dibbles    . estradiol (ESTRING) 2 MG vaginal ring Place 2 mg vaginally every 3 (three) months. follow package directions- GYN    . fluticasone (FLONASE) 50 MCG/ACT nasal spray Place 2 sprays into both nostrils at bedtime. 16 g 1  . gabapentin (NEURONTIN) 300 MG capsule Take 2 capsules by mouth 3 (three) times daily. [facility]- may take 3 capsules 3 times per day per pt    . hydrochlorothiazide (HYDRODIURIL) 25 MG tablet TAKE 1 TABLET EVERY MORNING 90 tablet 1  . HYDROcodone-acetaminophen (NORCO) 10-325 MG per tablet Take 1 tablet by mouth as needed. [facility]    . Lancets (ACCU-CHEK MULTICLIX) lancets USE 1 LANCET DAILY 102 each 0  . Lido-Capsaicin-Men-Methyl Sal 0.5-0.035-5-20 % PTCH Apply 1 application topically as needed. [facility]    . lisinopril (PRINIVIL,ZESTRIL) 10 MG tablet Take 1 tablet (10 mg total) by mouth daily. 90 tablet 1  . Magnesium 500 MG CAPS Take 1 capsule by mouth daily.    . metFORMIN (GLUCOPHAGE) 500 MG tablet Take 1,000 mg by mouth 2 (two) times daily. Dr Eddie Dibbles    . metoprolol  succinate (TOPROL-XL) 25 MG 24 hr tablet Take 1 tablet by mouth daily. Cardio- takes with 50mg = 75    . metoprolol succinate (TOPROL-XL) 50 MG 24 hr tablet TAKE 1 TABLET DAILY WITH OR IMMEDIATELY FOLLOWING A MEAL 90 tablet 1  . montelukast (SINGULAIR) 10 MG tablet Take 1 tablet (10 mg total) by mouth daily. 90 tablet 1  . Multiple Vitamins-Minerals (CENTRUM SILVER ULTRA WOMENS PO) Take 1 tablet by mouth daily.    . nitroGLYCERIN (NITROSTAT) 0.4 MG SL tablet Place 1 tablet under the tongue as needed.    . OXYCONTIN 10 MG 12 hr  tablet Take 10 mg by mouth 2 (two) times daily. Pain Dr  0  . pantoprazole (PROTONIX) 40 MG tablet Take 1 tablet (40 mg total) by mouth daily. 90 tablet 1  . promethazine (PHENERGAN) 25 MG suppository Place 1 suppository (25 mg total) rectally every 6 (six) hours as needed for nausea or vomiting. Pt says she has "had promethazine several times and has been fine" 12 each 0  . rosuvastatin (CRESTOR) 20 MG tablet Take 1 tablet (20 mg total) by mouth daily. Dr Chryl Heck 90 tablet 1  . SUMAtriptan (IMITREX) 50 MG tablet Take 1 tablet (50 mg total) by mouth as needed. 10 tablet 1  . tiZANidine (ZANAFLEX) 4 MG capsule Take 1 capsule by mouth as needed. [facility]    . vitamin B-12 (CYANOCOBALAMIN) 1000 MCG tablet Take 1,000 mcg by mouth daily.     No facility-administered medications prior to visit.     Review of Systems  Constitutional: Negative for chills, diaphoresis, fever, malaise/fatigue and weight loss.  HENT: Positive for congestion, ear pain, hoarse voice, postnasal drip, rhinorrhea, sinus pressure, sneezing and sore throat. Negative for ear discharge, hearing loss and mouth sores.   Eyes: Positive for discharge, redness and itching. Negative for blurred vision, double vision and pain.  Respiratory: Positive for cough. Negative for hemoptysis, sputum production, shortness of breath, wheezing and stridor.   Cardiovascular: Negative for chest pain, palpitations and leg swelling.   Gastrointestinal: Negative for abdominal pain, blood in stool, constipation, diarrhea, heartburn, melena and nausea.  Genitourinary: Negative for dysuria, frequency, hematuria and urgency.  Musculoskeletal: Positive for neck pain. Negative for back pain, joint pain and myalgias.  Skin: Negative for rash.  Neurological: Positive for headaches. Negative for dizziness, tingling, sensory change and focal weakness.  Endo/Heme/Allergies: Negative for environmental allergies and polydipsia. Does not bruise/bleed easily.  Psychiatric/Behavioral: Negative for depression and suicidal ideas. The patient is not nervous/anxious and does not have insomnia.      Objective  Vitals:   03/21/18 1005  BP: 120/64  Pulse: 80  Weight: 174 lb (78.9 kg)  Height: 5\' 5"  (1.651 m)    Physical Exam  Constitutional: She is well-developed, well-nourished, and in no distress. No distress.  HENT:  Head: Normocephalic and atraumatic.  Right Ear: External ear normal. Tympanic membrane is retracted.  Left Ear: External ear normal. Tympanic membrane is retracted.  Nose: Right sinus exhibits maxillary sinus tenderness. Left sinus exhibits maxillary sinus tenderness.  Mouth/Throat: Oropharynx is clear and moist. No oropharyngeal exudate, posterior oropharyngeal edema or posterior oropharyngeal erythema.  Eyes: Pupils are equal, round, and reactive to light. Conjunctivae and EOM are normal. Right eye exhibits no discharge. Left eye exhibits no discharge.  Neck: Normal range of motion. Neck supple. No JVD present. No thyromegaly present.  Cardiovascular: Normal rate, regular rhythm, normal heart sounds and intact distal pulses. Exam reveals no gallop and no friction rub.  No murmur heard. Pulmonary/Chest: Effort normal and breath sounds normal. She has no wheezes. She has no rales.  Abdominal: Soft. Bowel sounds are normal. She exhibits no mass. There is no tenderness. There is no guarding.  Musculoskeletal: Normal  range of motion. She exhibits no edema.  Lymphadenopathy:    She has no cervical adenopathy.  Neurological: She is alert. She has normal reflexes.  Skin: Skin is warm and dry. She is not diaphoretic.  Psychiatric: Mood and affect normal.  Nursing note and vitals reviewed.     Assessment & Plan  Problem List Items Addressed  This Visit    None    Visit Diagnoses    Acute bacterial conjunctivitis of both eyes    -  Primary   Relevant Medications   ofloxacin (OCUFLOX) 0.3 % ophthalmic solution   azithromycin (ZITHROMAX) 250 MG tablet   Acute maxillary sinusitis, recurrence not specified       Relevant Medications   azithromycin (ZITHROMAX) 250 MG tablet   benzonatate (TESSALON) 100 MG capsule   Bronchitis       Relevant Medications   benzonatate (TESSALON) 100 MG capsule      Meds ordered this encounter  Medications  . ofloxacin (OCUFLOX) 0.3 % ophthalmic solution    Sig: Place 1 drop into both eyes 4 (four) times daily.    Dispense:  5 mL    Refill:  0  . azithromycin (ZITHROMAX) 250 MG tablet    Sig: 2 today then 1 a day for 4 days    Dispense:  6 tablet    Refill:  0  . benzonatate (TESSALON) 100 MG capsule    Sig: Take 1 capsule (100 mg total) by mouth 2 (two) times daily as needed for cough.    Dispense:  20 capsule    Refill:  0      Dr. Otilio Miu [facility] [place] [facility]  03/21/18

## 2018-03-22 DIAGNOSIS — E119 Type 2 diabetes mellitus without complications: Secondary | ICD-10-CM | POA: Diagnosis not present

## 2018-03-22 DIAGNOSIS — E538 Deficiency of other specified B group vitamins: Secondary | ICD-10-CM | POA: Diagnosis not present

## 2018-03-22 DIAGNOSIS — E785 Hyperlipidemia, unspecified: Secondary | ICD-10-CM | POA: Diagnosis not present

## 2018-03-30 DIAGNOSIS — E119 Type 2 diabetes mellitus without complications: Secondary | ICD-10-CM | POA: Diagnosis not present

## 2018-03-30 DIAGNOSIS — H40013 Open angle with borderline findings, low risk, bilateral: Secondary | ICD-10-CM | POA: Diagnosis not present

## 2018-03-30 DIAGNOSIS — H21233 Degeneration of iris (pigmentary), bilateral: Secondary | ICD-10-CM | POA: Diagnosis not present

## 2018-04-12 ENCOUNTER — Ambulatory Visit
Admission: RE | Admit: 2018-04-12 | Discharge: 2018-04-12 | Disposition: A | Discharge status: Home / Self Care | Payer: Medicare Other | Source: Ambulatory Visit | Attending: Family Medicine | Admitting: Family Medicine

## 2018-04-12 DIAGNOSIS — Z1231 Encounter for screening mammogram for malignant neoplasm of breast: Secondary | ICD-10-CM | POA: Insufficient documentation

## 2018-04-13 DIAGNOSIS — Z85828 Personal history of other malignant neoplasm of skin: Secondary | ICD-10-CM | POA: Diagnosis not present

## 2018-04-13 DIAGNOSIS — D225 Melanocytic nevi of trunk: Secondary | ICD-10-CM | POA: Diagnosis not present

## 2018-04-13 DIAGNOSIS — Z1283 Encounter for screening for malignant neoplasm of skin: Secondary | ICD-10-CM | POA: Diagnosis not present

## 2018-04-13 DIAGNOSIS — D1801 Hemangioma of skin and subcutaneous tissue: Secondary | ICD-10-CM | POA: Diagnosis not present

## 2018-04-13 DIAGNOSIS — L812 Freckles: Secondary | ICD-10-CM | POA: Diagnosis not present

## 2018-04-13 DIAGNOSIS — Z808 Family history of malignant neoplasm of other organs or systems: Secondary | ICD-10-CM | POA: Diagnosis not present

## 2018-04-13 DIAGNOSIS — L821 Other seborrheic keratosis: Secondary | ICD-10-CM | POA: Diagnosis not present

## 2018-05-12 DIAGNOSIS — M1711 Unilateral primary osteoarthritis, right knee: Secondary | ICD-10-CM | POA: Insufficient documentation

## 2018-05-12 DIAGNOSIS — M1712 Unilateral primary osteoarthritis, left knee: Secondary | ICD-10-CM | POA: Diagnosis not present

## 2018-05-12 DIAGNOSIS — M25562 Pain in left knee: Secondary | ICD-10-CM | POA: Diagnosis not present

## 2018-05-12 DIAGNOSIS — M25561 Pain in right knee: Secondary | ICD-10-CM | POA: Diagnosis not present

## 2018-05-12 DIAGNOSIS — G8929 Other chronic pain: Secondary | ICD-10-CM | POA: Diagnosis not present

## 2018-05-17 DIAGNOSIS — I2510 Atherosclerotic heart disease of native coronary artery without angina pectoris: Secondary | ICD-10-CM | POA: Diagnosis not present

## 2018-05-17 DIAGNOSIS — I10 Essential (primary) hypertension: Secondary | ICD-10-CM | POA: Diagnosis not present

## 2018-05-17 DIAGNOSIS — E7800 Pure hypercholesterolemia, unspecified: Secondary | ICD-10-CM | POA: Diagnosis not present

## 2018-05-19 DIAGNOSIS — M1712 Unilateral primary osteoarthritis, left knee: Secondary | ICD-10-CM | POA: Diagnosis not present

## 2018-09-05 ENCOUNTER — Other Ambulatory Visit: Payer: Self-pay | Admitting: Family Medicine

## 2018-09-05 DIAGNOSIS — E785 Hyperlipidemia, unspecified: Secondary | ICD-10-CM

## 2018-09-05 DIAGNOSIS — J301 Allergic rhinitis due to pollen: Secondary | ICD-10-CM

## 2018-09-05 DIAGNOSIS — K219 Gastro-esophageal reflux disease without esophagitis: Secondary | ICD-10-CM

## 2018-09-05 DIAGNOSIS — I10 Essential (primary) hypertension: Secondary | ICD-10-CM

## 2018-09-14 ENCOUNTER — Ambulatory Visit: Payer: Medicare Other | Admitting: Family Medicine

## 2018-09-26 ENCOUNTER — Encounter: Payer: Self-pay | Admitting: Family Medicine

## 2018-09-26 ENCOUNTER — Ambulatory Visit (INDEPENDENT_AMBULATORY_CARE_PROVIDER_SITE_OTHER): Payer: Medicare Other | Admitting: Family Medicine

## 2018-09-26 VITALS — BP 130/70 | HR 64 | Ht 65.0 in | Wt 174.0 lb

## 2018-09-26 DIAGNOSIS — Z23 Encounter for immunization: Secondary | ICD-10-CM | POA: Diagnosis not present

## 2018-09-26 DIAGNOSIS — N3090 Cystitis, unspecified without hematuria: Secondary | ICD-10-CM | POA: Diagnosis not present

## 2018-09-26 LAB — POCT URINALYSIS DIPSTICK
Bilirubin, UA: NEGATIVE
Blood, UA: NEGATIVE
Glucose, UA: NEGATIVE
Ketones, UA: NEGATIVE
Nitrite, UA: NEGATIVE
Protein, UA: NEGATIVE
Spec Grav, UA: 1.02 (ref 1.010–1.025)
Urobilinogen, UA: 0.2 E.U./dL
pH, UA: 5 (ref 5.0–8.0)

## 2018-09-26 MED ORDER — NITROFURANTOIN MONOHYD MACRO 100 MG PO CAPS: 100.0000 mg | ORAL_CAPSULE | Freq: Two times a day (BID) | ORAL | 0 refills | Status: DC

## 2018-09-26 NOTE — Progress Notes (Signed)
Date:  09/26/2018   Name:  Nicole Cook   DOB:  August 20, 1951   MRN:  [id number]   Chief Complaint: Urinary Tract Infection (pain and pressure at beginning and end of stream- took OTC med) Urinary Tract Infection   This is a new problem. The current episode started in the past 7 days. The problem has been gradually worsening. The quality of the pain is described as burning. The pain is mild. There has been no fever. Associated symptoms include chills, flank pain, frequency, hematuria, nausea and urgency. Pertinent negatives include no discharge, hesitancy, sweats or vomiting. Treatments tried: azo.     Review of Systems  Constitutional: Positive for chills. Negative for fatigue, fever and unexpected weight change.  HENT: Negative for congestion, ear discharge, ear pain, rhinorrhea, sinus pressure, sneezing and sore throat.   Eyes: Negative for photophobia, pain, discharge, redness and itching.  Respiratory: Negative for cough, shortness of breath, wheezing and stridor.   Gastrointestinal: Positive for nausea. Negative for abdominal pain, blood in stool, constipation, diarrhea and vomiting.  Endocrine: Negative for cold intolerance, heat intolerance, polydipsia, polyphagia and polyuria.  Genitourinary: Positive for flank pain, frequency, hematuria and urgency. Negative for dysuria, hesitancy, menstrual problem, pelvic pain, vaginal bleeding and vaginal discharge.  Musculoskeletal: Negative for arthralgias, back pain and myalgias.  Skin: Negative for rash.  Allergic/Immunologic: Negative for environmental allergies and food allergies.  Neurological: Negative for dizziness, weakness, light-headedness, numbness and headaches.  Hematological: Negative for adenopathy. Does not bruise/bleed easily.  Psychiatric/Behavioral: Negative for dysphoric mood. The patient is not nervous/anxious.     Patient Active Problem List   Diagnosis Date Noted  . Taking medication for chronic disease  03/02/2017  . Seasonal allergic rhinitis due to pollen 03/02/2017  . Gastroesophageal reflux disease 03/02/2017  . Migraine with aura and without status migrainosus, not intractable 03/02/2017  . Diabetes mellitus with no complication ([place]) 67/89/3810  . Familial multiple lipoprotein-type hyperlipidemia 04/03/2015  . Laboratory animal allergy 04/03/2015  . Cephalalgia 04/03/2015  . Essential hypertension 04/03/2015  . Blood glucose elevated 04/03/2015  . Routine general medical examination at a health care facility 04/03/2015  . Coronary artery disease 04/03/2015    Allergies  Allergen Reactions  . Aspartame Other (See Comments)  . Compazine [Prochlorperazine]   . Penicillins   . Sulfa Antibiotics   . Sulfamethoxazole-Trimethoprim Hives and Other (See Comments)    Other Reaction: Not Assessed     Past Surgical History:  Procedure Laterality Date  . back surgery x 2    . c-section x 3    . COLONOSCOPY  2011   cleared for 5 yrs- Dr Beckey Downing  . KNEE ARTHROSCOPY Right 2010  . VAGINAL HYSTERECTOMY  12/14/1994    Social History   Tobacco Use  . Smoking status: Never Smoker  . Smokeless tobacco: Never Used  Substance Use Topics  . Alcohol use: No    Alcohol/week: 0.0 standard drinks  . Drug use: No     Medication list has been reviewed and updated.  Current Meds  Medication Sig  . amLODipine (NORVASC) 5 MG tablet TAKE 1 TABLET DAILY  . aspirin 81 MG tablet Take 1 tablet by mouth daily.  Marland Kitchen BLOOD GLUCOSE MONITORING SUPPL 1 application daily.  . cetirizine (ZYRTEC) 10 MG tablet Take 10 mg by mouth daily. otc  . Cholecalciferol (VITAMIN D3) 1000 units CAPS Take 1 capsule by mouth daily.  . Coenzyme Q10 (EQL COQ10) 300 MG CAPS Take 1 capsule by mouth  daily.  . Dulaglutide (TRULICITY) 2.13 YQ/6.5HQ SOPN Inject 1 each into the skin once a week. Dr Eddie Dibbles   . estradiol (ESTRING) 2 MG vaginal ring Place 2 mg vaginally every 3 (three) months. follow package directions- GYN  .  fluticasone (FLONASE) 50 MCG/ACT nasal spray USE 2 SPRAYS IN EACH NOSTRIL AT BEDTIME  . gabapentin (NEURONTIN) 300 MG capsule Take 2 capsules by mouth 3 (three) times daily. [facility]- may take 3 capsules 3 times per day per pt  . hydrochlorothiazide (HYDRODIURIL) 25 MG tablet TAKE 1 TABLET EVERY MORNING  . HYDROcodone-acetaminophen (NORCO) 10-325 MG per tablet Take 1 tablet by mouth as needed. [facility]  . Lancets (ACCU-CHEK MULTICLIX) lancets USE 1 LANCET DAILY  . Lido-Capsaicin-Men-Methyl Sal 0.5-0.035-5-20 % PTCH Apply 1 application topically as needed. [facility]  . lisinopril (PRINIVIL,ZESTRIL) 10 MG tablet TAKE 1 TABLET DAILY  . Magnesium 500 MG CAPS Take 1 capsule by mouth daily.  . metFORMIN (GLUCOPHAGE) 500 MG tablet Take 1,000 mg by mouth 2 (two) times daily. Dr Eddie Dibbles  . metoprolol succinate (TOPROL-XL) 25 MG 24 hr tablet Take 1 tablet by mouth daily. Cardio- takes with 50mg = 75  . metoprolol succinate (TOPROL-XL) 50 MG 24 hr tablet TAKE 1 TABLET DAILY WITH OR IMMEDIATELY FOLLOWING A MEAL  . montelukast (SINGULAIR) 10 MG tablet Take 1 tablet (10 mg total) by mouth daily.  . Multiple Vitamins-Minerals (CENTRUM SILVER ULTRA WOMENS PO) Take 1 tablet by mouth daily.  . nitroGLYCERIN (NITROSTAT) 0.4 MG SL tablet Place 1 tablet under the tongue as needed.  . OXYCONTIN 10 MG 12 hr tablet Take 10 mg by mouth 2 (two) times daily. Pain Dr  . pantoprazole (PROTONIX) 40 MG tablet TAKE 1 TABLET DAILY  . rosuvastatin (CRESTOR) 20 MG tablet TAKE 1 TABLET DAILY, DOCTOR KOMADA  . SUMAtriptan (IMITREX) 50 MG tablet Take 1 tablet (50 mg total) by mouth as needed.  Marland Kitchen tiZANidine (ZANAFLEX) 4 MG capsule Take 1 capsule by mouth as needed. [facility]  . vitamin B-12 (CYANOCOBALAMIN) 1000 MCG tablet Take 1,000 mcg by mouth daily.    PHQ 2/9 Scores 11/15/2017 09/06/2017 09/02/2016 09/02/2015  PHQ - 2 Score 0 0 0 0  PHQ- 9 Score - 0 - -    Physical Exam  Constitutional: She appears well-developed and  well-nourished. No distress.  HENT:  Head: Normocephalic and atraumatic.  Right Ear: External ear normal.  Left Ear: External ear normal.  Nose: Nose normal.  Mouth/Throat: Oropharynx is clear and moist.  Eyes: Pupils are equal, round, and reactive to light. Conjunctivae and EOM are normal. Right eye exhibits no discharge. Left eye exhibits no discharge.  Neck: Normal range of motion. Neck supple. No JVD present. No thyromegaly present.  Cardiovascular: Normal rate, regular rhythm, normal heart sounds and intact distal pulses. Exam reveals no gallop and no friction rub.  No murmur heard. Pulmonary/Chest: Effort normal and breath sounds normal. No stridor. She has no wheezes. She has no rales.  Abdominal: Soft. Bowel sounds are normal. She exhibits no mass. There is no tenderness. There is no guarding.  Musculoskeletal: Normal range of motion. She exhibits no edema.  Lymphadenopathy:    She has no cervical adenopathy.  Neurological: She is alert. She has normal reflexes.  Skin: Skin is warm and dry. She is not diaphoretic.  Nursing note and vitals reviewed.   BP 130/70   Pulse 64   Ht 5\' 5"  (1.651 m)   Wt 174 lb (78.9 kg)  BMI 28.96 kg/m   Assessment and Plan: 1. Cystitis Acute. Noted few leukocytes. Prescribe macrobid bid for 3 days. - nitrofurantoin, macrocrystal-monohydrate, (MACROBID) 100 MG capsule; Take 1 capsule (100 mg total) by mouth 2 (two) times daily.  Dispense: 6 capsule; Refill: 0 - POCT urinalysis dipstick  2. Flu vaccine need Discussed and administered - Flu vaccine HIGH DOSE PF (Fluzone High dose)    Dr. [facility]  09/26/2018

## 2018-10-09 ENCOUNTER — Other Ambulatory Visit: Payer: Self-pay | Admitting: Family Medicine

## 2018-10-09 DIAGNOSIS — I10 Essential (primary) hypertension: Secondary | ICD-10-CM

## 2018-10-09 DIAGNOSIS — J301 Allergic rhinitis due to pollen: Secondary | ICD-10-CM

## 2018-10-10 ENCOUNTER — Encounter: Payer: Self-pay | Admitting: Family Medicine

## 2018-10-10 ENCOUNTER — Ambulatory Visit (INDEPENDENT_AMBULATORY_CARE_PROVIDER_SITE_OTHER): Payer: Medicare Other | Admitting: Family Medicine

## 2018-10-10 VITALS — BP 120/70 | HR 64 | Ht 65.0 in | Wt 171.0 lb

## 2018-10-10 DIAGNOSIS — R69 Illness, unspecified: Secondary | ICD-10-CM | POA: Diagnosis not present

## 2018-10-10 DIAGNOSIS — J301 Allergic rhinitis due to pollen: Secondary | ICD-10-CM

## 2018-10-10 DIAGNOSIS — E119 Type 2 diabetes mellitus without complications: Secondary | ICD-10-CM | POA: Diagnosis not present

## 2018-10-10 DIAGNOSIS — E785 Hyperlipidemia, unspecified: Secondary | ICD-10-CM

## 2018-10-10 DIAGNOSIS — I10 Essential (primary) hypertension: Secondary | ICD-10-CM | POA: Diagnosis not present

## 2018-10-10 DIAGNOSIS — K219 Gastro-esophageal reflux disease without esophagitis: Secondary | ICD-10-CM | POA: Diagnosis not present

## 2018-10-10 DIAGNOSIS — Z862 Personal history of diseases of the blood and blood-forming organs and certain disorders involving the immune mechanism: Secondary | ICD-10-CM | POA: Diagnosis not present

## 2018-10-10 DIAGNOSIS — G43109 Migraine with aura, not intractable, without status migrainosus: Secondary | ICD-10-CM | POA: Diagnosis not present

## 2018-10-10 MED ORDER — AMLODIPINE BESYLATE 5 MG PO TABS: 5.0000 mg | ORAL_TABLET | Freq: Every day | ORAL | 1 refills | Status: DC

## 2018-10-10 MED ORDER — HYDROCHLOROTHIAZIDE 25 MG PO TABS: 25.0000 mg | ORAL_TABLET | Freq: Every morning | ORAL | 1 refills | Status: DC

## 2018-10-10 MED ORDER — ROSUVASTATIN CALCIUM 20 MG PO TABS: ORAL_TABLET | ORAL | 1 refills | Status: DC

## 2018-10-10 MED ORDER — MONTELUKAST SODIUM 10 MG PO TABS: 10.0000 mg | ORAL_TABLET | Freq: Every day | ORAL | 1 refills | Status: DC

## 2018-10-10 MED ORDER — SUMATRIPTAN SUCCINATE 50 MG PO TABS: 50.0000 mg | ORAL_TABLET | ORAL | 1 refills | Status: DC | PRN

## 2018-10-10 MED ORDER — PANTOPRAZOLE SODIUM 40 MG PO TBEC: 40.0000 mg | DELAYED_RELEASE_TABLET | Freq: Every day | ORAL | 1 refills | Status: DC

## 2018-10-10 MED ORDER — METOPROLOL SUCCINATE ER 25 MG PO TB24: 25.0000 mg | ORAL_TABLET | Freq: Every day | ORAL | 1 refills | Status: DC

## 2018-10-10 MED ORDER — METOPROLOL SUCCINATE ER 50 MG PO TB24: ORAL_TABLET | ORAL | 1 refills | Status: DC

## 2018-10-10 MED ORDER — FLUTICASONE PROPIONATE 50 MCG/ACT NA SUSP: NASAL | 1 refills | Status: DC

## 2018-10-10 MED ORDER — LISINOPRIL 10 MG PO TABS: 10.0000 mg | ORAL_TABLET | Freq: Every day | ORAL | 1 refills | Status: DC

## 2018-10-10 NOTE — Progress Notes (Signed)
Date:  10/10/2018   Name:  Nicole Cook   DOB:  [birth year]   MRN:  [id number]   Chief Complaint: Hypertension; Hyperlipidemia; Allergic Rhinitis ; Gastroesophageal Reflux; and Headache Hypertension  This is a chronic problem. The current episode started more than 1 year ago. The problem is unchanged. The problem is controlled. Associated symptoms include headaches. Pertinent negatives include no anxiety, blurred vision, chest pain, malaise/fatigue, neck pain, orthopnea, palpitations, peripheral edema, PND, shortness of breath or sweats. There are no associated agents to hypertension. Risk factors for coronary artery disease include dyslipidemia, stress, obesity, diabetes mellitus and post-menopausal state. Past treatments include ACE inhibitors, calcium channel blockers and diuretics. The current treatment provides moderate improvement. There are no compliance problems.  There is no history of angina, kidney disease, CAD/MI, CVA, heart failure, left ventricular hypertrophy, PVD or retinopathy.  Hyperlipidemia  This is a chronic problem. The current episode started more than 1 year ago. The problem is controlled. Recent lipid tests were reviewed and are normal. Exacerbating diseases include diabetes. Factors aggravating her hyperlipidemia include thiazides. Pertinent negatives include no chest pain, focal sensory loss, focal weakness, leg pain, myalgias or shortness of breath. Current antihyperlipidemic treatment includes statins and diet change. The current treatment provides moderate improvement of lipids. There are no compliance problems.   Gastroesophageal Reflux  She complains of nausea. She reports no abdominal pain, no belching, no chest pain, no choking, no coughing, no dysphagia, no early satiety, no heartburn, no sore throat or no wheezing. This is a chronic problem. The current episode started more than 1 year ago. The problem occurs rarely. The problem has been waxing and waning. The  symptoms are aggravated by certain foods. Pertinent negatives include no fatigue. There are no known risk factors. She has tried a PPI for the symptoms. The treatment provided moderate relief.  Headache   This is a chronic problem. The current episode started more than 1 year ago. The problem has been waxing and waning. The pain is located in the right unilateral region. The quality of the pain is described as throbbing. Associated symptoms include nausea and photophobia. Pertinent negatives include no abdominal pain, back pain, blurred vision, coughing, dizziness, ear pain, eye pain, eye redness, fever, neck pain, numbness, rhinorrhea, sinus pressure, sore throat, vomiting or weakness. The treatment provided moderate relief. Her past medical history is significant for hypertension and migraine headaches.     Review of Systems  Constitutional: Negative.  Negative for chills, fatigue, fever, malaise/fatigue and unexpected weight change.  HENT: Negative for congestion, ear discharge, ear pain, rhinorrhea, sinus pressure, sneezing and sore throat.   Eyes: Positive for photophobia. Negative for blurred vision, pain, discharge, redness and itching.  Respiratory: Negative for cough, choking, shortness of breath, wheezing and stridor.   Cardiovascular: Negative for chest pain, palpitations, orthopnea and PND.  Gastrointestinal: Positive for nausea. Negative for abdominal pain, blood in stool, constipation, diarrhea, dysphagia, heartburn and vomiting.  Endocrine: Negative for cold intolerance, heat intolerance, polydipsia, polyphagia and polyuria.  Genitourinary: Negative for dysuria, flank pain, frequency, hematuria, menstrual problem, pelvic pain, urgency, vaginal bleeding and vaginal discharge.  Musculoskeletal: Negative for arthralgias, back pain, myalgias and neck pain.  Skin: Negative for rash.  Allergic/Immunologic: Negative for environmental allergies and food allergies.  Neurological: Positive for  headaches. Negative for dizziness, focal weakness, weakness, light-headedness and numbness.  Hematological: Negative for adenopathy. Does not bruise/bleed easily.  Psychiatric/Behavioral: Negative for dysphoric mood. The patient is not nervous/anxious.  Patient Active Problem List   Diagnosis Date Noted  . Taking medication for chronic disease 03/02/2017  . Seasonal allergic rhinitis due to pollen 03/02/2017  . Gastroesophageal reflux disease 03/02/2017  . Migraine with aura and without status migrainosus, not intractable 03/02/2017  . Diabetes mellitus with no complication ([place]) 88/50/2774  . Familial multiple lipoprotein-type hyperlipidemia 04/03/2015  . Laboratory animal allergy 04/03/2015  . Cephalalgia 04/03/2015  . Essential hypertension 04/03/2015  . Blood glucose elevated 04/03/2015  . Routine general medical examination at a health care facility 04/03/2015  . Coronary artery disease 04/03/2015    Allergies  Allergen Reactions  . Aspartame Other (See Comments)  . Compazine [Prochlorperazine]   . Penicillins   . Sulfa Antibiotics   . Sulfamethoxazole-Trimethoprim Hives and Other (See Comments)    Other Reaction: Not Assessed     Past Surgical History:  Procedure Laterality Date  . back surgery x 2    . c-section x 3    . COLONOSCOPY  2011   cleared for 5 yrs- Dr Beckey Downing  . KNEE ARTHROSCOPY Right 2010  . VAGINAL HYSTERECTOMY  12/14/1994    Social History   Tobacco Use  . Smoking status: Never Smoker  . Smokeless tobacco: Never Used  Substance Use Topics  . Alcohol use: No    Alcohol/week: 0.0 standard drinks  . Drug use: No     Medication list has been reviewed and updated.  Current Meds  Medication Sig  . amLODipine (NORVASC) 5 MG tablet TAKE 1 TABLET DAILY  . aspirin 81 MG tablet Take 1 tablet by mouth daily.  Marland Kitchen BLOOD GLUCOSE MONITORING SUPPL 1 application daily.  . cetirizine (ZYRTEC) 10 MG tablet Take 10 mg by mouth daily. otc  .  Cholecalciferol (VITAMIN D3) 1000 units CAPS Take 1 capsule by mouth daily.  . Coenzyme Q10 (EQL COQ10) 300 MG CAPS Take 1 capsule by mouth daily.  . Dulaglutide (TRULICITY) 1.28 NO/6.7EH SOPN Inject 1 each into the skin once a week. Dr Eddie Dibbles   . estradiol (ESTRING) 2 MG vaginal ring Place 2 mg vaginally every 3 (three) months. follow package directions- GYN  . fluticasone (FLONASE) 50 MCG/ACT nasal spray USE 2 SPRAYS IN EACH NOSTRIL AT BEDTIME  . gabapentin (NEURONTIN) 300 MG capsule Take 2 capsules by mouth 3 (three) times daily. [facility]- may take 3 capsules 3 times per day per pt  . hydrochlorothiazide (HYDRODIURIL) 25 MG tablet TAKE 1 TABLET EVERY MORNING  . HYDROcodone-acetaminophen (NORCO) 10-325 MG per tablet Take 1 tablet by mouth as needed. [facility]  . Lancets (ACCU-CHEK MULTICLIX) lancets USE 1 LANCET DAILY  . Lido-Capsaicin-Men-Methyl Sal 0.5-0.035-5-20 % PTCH Apply 1 application topically as needed. [facility]  . lisinopril (PRINIVIL,ZESTRIL) 10 MG tablet TAKE 1 TABLET DAILY  . Magnesium 500 MG CAPS Take 1 capsule by mouth daily.  . metFORMIN (GLUCOPHAGE) 500 MG tablet Take 1,000 mg by mouth 2 (two) times daily. Dr Eddie Dibbles  . metoprolol succinate (TOPROL-XL) 25 MG 24 hr tablet Take 1 tablet by mouth daily. Cardio- takes with 50mg = 75  . metoprolol succinate (TOPROL-XL) 50 MG 24 hr tablet TAKE 1 TABLET DAILY WITH OR IMMEDIATELY FOLLOWING A MEAL  . montelukast (SINGULAIR) 10 MG tablet TAKE 1 TABLET DAILY  . Multiple Vitamins-Minerals (CENTRUM SILVER ULTRA WOMENS PO) Take 1 tablet by mouth daily.  . nitroGLYCERIN (NITROSTAT) 0.4 MG SL tablet Place 1 tablet under the tongue as needed.  . OXYCONTIN 10 MG 12 hr tablet Take 10 mg by  mouth 2 (two) times daily. Pain Dr  . pantoprazole (PROTONIX) 40 MG tablet TAKE 1 TABLET DAILY  . rosuvastatin (CRESTOR) 20 MG tablet TAKE 1 TABLET DAILY, DOCTOR KOMADA  . SUMAtriptan (IMITREX) 50 MG tablet Take 1 tablet (50 mg total) by mouth as needed.  Marland Kitchen  tiZANidine (ZANAFLEX) 4 MG capsule Take 1 capsule by mouth as needed. [facility]  . vitamin B-12 (CYANOCOBALAMIN) 1000 MCG tablet Take 1,000 mcg by mouth daily.    PHQ 2/9 Scores 10/10/2018 11/15/2017 09/06/2017 09/02/2016  PHQ - 2 Score 0 0 0 0  PHQ- 9 Score 0 - 0 -    Physical Exam  Constitutional: No distress.  HENT:  Head: Normocephalic and atraumatic.  Right Ear: External ear normal.  Left Ear: External ear normal.  Nose: Nose normal.  Mouth/Throat: Oropharynx is clear and moist.  Eyes: Pupils are equal, round, and reactive to light. Conjunctivae and EOM are normal. Right eye exhibits no discharge. Left eye exhibits no discharge.  Neck: Normal range of motion. Neck supple. No JVD present. No thyromegaly present.  Cardiovascular: Normal rate, regular rhythm, S1 normal, S2 normal, normal heart sounds and intact distal pulses. Exam reveals no gallop, no S3, no S4 and no friction rub.  No murmur heard.  No diastolic murmur is present. Pulses:      Carotid pulses are 2+ on the right side, and 2+ on the left side.      Radial pulses are 2+ on the right side, and 2+ on the left side.       Femoral pulses are 2+ on the right side, and 2+ on the left side.      Popliteal pulses are 2+ on the right side, and 2+ on the left side.       Dorsalis pedis pulses are 2+ on the right side, and 2+ on the left side.       Posterior tibial pulses are 2+ on the right side, and 2+ on the left side.  Pulmonary/Chest: Effort normal and breath sounds normal.  Abdominal: Soft. Bowel sounds are normal. She exhibits no mass. There is no tenderness. There is no guarding.  Musculoskeletal: Normal range of motion. She exhibits no edema.  Lymphadenopathy:    She has no cervical adenopathy.  Neurological: She is alert. She has normal reflexes. No cranial nerve deficit or sensory deficit.  Skin: Skin is warm and dry. Capillary refill takes less than 2 seconds. She is not diaphoretic. There is pallor.  Nursing note  and vitals reviewed.   BP 120/70   Pulse 64   Ht 5\' 5"  (1.651 m)   Wt 171 lb (77.6 kg)   BMI 28.46 kg/m   Assessment and Plan:  1. Essential hypertension Chronic Stable. Controlled. Continue amlodipine5 mg,hctz 25 mg,lisinopril 10 mg, and metoprolol 50 mg XL daily - amLODipine (NORVASC) 5 MG tablet; Take 1 tablet (5 mg total) by mouth daily.  Dispense: 90 tablet; Refill: 1 - hydrochlorothiazide (HYDRODIURIL) 25 MG tablet; Take 1 tablet (25 mg total) by mouth every morning.  Dispense: 90 tablet; Refill: 1 - lisinopril (PRINIVIL,ZESTRIL) 10 MG tablet; Take 1 tablet (10 mg total) by mouth daily.  Dispense: 90 tablet; Refill: 1 - metoprolol succinate (TOPROL-XL) 50 MG 24 hr tablet; TAKE 1 TABLET DAILY WITH OR IMMEDIATELY FOLLOWING A MEAL  Dispense: 90 tablet; Refill: 1  2. Hyperlipidemia, unspecified hyperlipidemia type Chronic. Controlled. Continue Crestor 20 mg daily. - rosuvastatin (CRESTOR) 20 MG tablet; TAKE 1 TABLET DAILY, DOCTOR KOMADA  Dispense: 90 tablet; Refill: 1  3. Seasonal allergic rhinitis due to pollen Continue flonase and singulair as needed. - fluticasone (FLONASE) 50 MCG/ACT nasal spray; USE 2 SPRAYS IN EACH NOSTRIL AT BEDTIME  Dispense: 48 g; Refill: 1 - montelukast (SINGULAIR) 10 MG tablet; Take 1 tablet (10 mg total) by mouth daily.  Dispense: 90 tablet; Refill: 1  4. Gastroesophageal reflux disease, esophagitis presence not specified Chronic Controlled. Continue pantoprazole 40 mg daily - pantoprazole (PROTONIX) 40 MG tablet; Take 1 tablet (40 mg total) by mouth daily.  Dispense: 90 tablet; Refill: 1  5. Migraine with aura and without status migrainosus, not intractable Reemphasize not  For multiple consecutive days. Refill for migraine prn. - SUMAtriptan (IMITREX) 50 MG tablet; Take 1 tablet (50 mg total) by mouth as needed.  Dispense: 10 tablet; Refill: 1    Dr. [facility]  10/10/2018

## 2018-10-11 LAB — RENAL FUNCTION PANEL
Albumin: 4.7 g/dL (ref 3.6–4.8)
BUN/Creatinine Ratio: 17 (ref 12–28)
BUN: 11 mg/dL (ref 8–27)
CO2: 26 mmol/L (ref 20–29)
Calcium: 9.6 mg/dL (ref 8.7–10.3)
Chloride: 98 mmol/L (ref 96–106)
Creatinine, Ser: 0.63 mg/dL (ref 0.57–1.00)
GFR calc Af Amer: 106 mL/min/{1.73_m2} (ref >59)
GFR calc non Af Amer: 92 mL/min/{1.73_m2} (ref >59)
Glucose: 144 mg/dL — ABNORMAL HIGH (ref 65–99)
Phosphorus: 3.3 mg/dL (ref 2.5–4.5)
Potassium: 4.4 mmol/L (ref 3.5–5.2)
Sodium: 140 mmol/L (ref 134–144)

## 2018-10-11 LAB — CBC WITH DIFFERENTIAL/PLATELET
Basophils Absolute: 0 10*3/uL (ref 0.0–0.2)
Basos: 1 %
EOS (ABSOLUTE): 0.2 10*3/uL (ref 0.0–0.4)
Eos: 4 %
Hematocrit: 37.2 % (ref 34.0–46.6)
Hemoglobin: 12.4 g/dL (ref 11.1–15.9)
Immature Grans (Abs): 0 10*3/uL (ref 0.0–0.1)
Immature Granulocytes: 0 %
Lymphocytes Absolute: 1.7 10*3/uL (ref 0.7–3.1)
Lymphs: 34 %
MCH: 28.6 pg (ref 26.6–33.0)
MCHC: 33.3 g/dL (ref 31.5–35.7)
MCV: 86 fL (ref 79–97)
Monocytes Absolute: 0.3 10*3/uL (ref 0.1–0.9)
Monocytes: 6 %
Neutrophils Absolute: 2.7 10*3/uL (ref 1.4–7.0)
Neutrophils: 55 %
Platelets: 185 10*3/uL (ref 150–450)
RBC: 4.34 x10E6/uL (ref 3.77–5.28)
RDW: 13 % (ref 12.3–15.4)
WBC: 5 10*3/uL (ref 3.4–10.8)

## 2018-10-11 LAB — HEMOGLOBIN A1C
Est. average glucose Bld gHb Est-mCnc: 137 mg/dL
Hgb A1c MFr Bld: 6.4 % — ABNORMAL HIGH (ref 4.8–5.6)

## 2018-10-11 LAB — LIPID PANEL WITH LDL/HDL RATIO
Cholesterol, Total: 120 mg/dL (ref 100–199)
HDL: 42 mg/dL (ref >39)
LDL Calculated: 49 mg/dL (ref 0–99)
LDl/HDL Ratio: 1.2 ratio (ref 0.0–3.2)
Triglycerides: 145 mg/dL (ref 0–149)
VLDL Cholesterol Cal: 29 mg/dL (ref 5–40)

## 2018-10-11 LAB — HEPATIC FUNCTION PANEL
ALT: 17 IU/L (ref 0–32)
AST: 12 IU/L (ref 0–40)
Alkaline Phosphatase: 37 IU/L — ABNORMAL LOW (ref 39–117)
Bilirubin Total: 0.3 mg/dL (ref 0.0–1.2)
Bilirubin, Direct: 0.1 mg/dL (ref 0.00–0.40)
Total Protein: 6.6 g/dL (ref 6.0–8.5)

## 2018-11-03 ENCOUNTER — Other Ambulatory Visit: Payer: Self-pay

## 2018-11-16 ENCOUNTER — Ambulatory Visit: Payer: Self-pay

## 2018-11-16 ENCOUNTER — Ambulatory Visit: Payer: Medicare Other

## 2018-11-17 ENCOUNTER — Encounter: Payer: Medicare Other | Admitting: Family Medicine

## 2018-11-21 ENCOUNTER — Ambulatory Visit: Payer: Medicare Other

## 2018-11-22 ENCOUNTER — Encounter: Payer: Self-pay | Admitting: Family Medicine

## 2018-11-22 ENCOUNTER — Ambulatory Visit (INDEPENDENT_AMBULATORY_CARE_PROVIDER_SITE_OTHER): Payer: Medicare Other | Admitting: Family Medicine

## 2018-11-22 VITALS — BP 130/70 | HR 68 | Ht 65.0 in | Wt 172.0 lb

## 2018-11-22 DIAGNOSIS — Z0000 Encounter for general adult medical examination without abnormal findings: Secondary | ICD-10-CM

## 2018-11-22 DIAGNOSIS — Z1211 Encounter for screening for malignant neoplasm of colon: Secondary | ICD-10-CM | POA: Diagnosis not present

## 2018-11-22 LAB — HEMOCCULT GUIAC POC 1CARD (OFFICE): Fecal Occult Blood, POC: NEGATIVE

## 2018-11-22 NOTE — Progress Notes (Addendum)
Date:  11/22/2018   Name:  Nicole Cook   DOB:  Apr 17, 1951   MRN:  [id number]   Chief Complaint: Annual Exam (mammo needs to be sched) Patient is a 67 year old female who presents for a comprehensive physical exam. The patient reports the following problems: previous rash. Health maintenance has been reviewed foot exam.    Review of Systems  Constitutional: Negative.  Negative for chills, fatigue, fever and unexpected weight change.  HENT: Negative for congestion, ear discharge, ear pain, rhinorrhea, sinus pressure, sneezing and sore throat.   Eyes: Negative for photophobia, pain, discharge, redness and itching.  Respiratory: Negative for cough, shortness of breath, wheezing and stridor.   Gastrointestinal: Negative for abdominal pain, blood in stool, constipation, diarrhea, nausea and vomiting.  Endocrine: Negative for cold intolerance, heat intolerance, polydipsia, polyphagia and polyuria.  Genitourinary: Negative for dysuria, flank pain, frequency, hematuria, menstrual problem, pelvic pain, urgency, vaginal bleeding and vaginal discharge.  Musculoskeletal: Negative for arthralgias, back pain and myalgias.  Skin: Negative for rash.  Allergic/Immunologic: Negative for environmental allergies and food allergies.  Neurological: Negative for dizziness, weakness, light-headedness, numbness and headaches.  Hematological: Negative for adenopathy. Does not bruise/bleed easily.  Psychiatric/Behavioral: Negative for dysphoric mood. The patient is not nervous/anxious.     Patient Active Problem List   Diagnosis Date Noted  . Taking medication for chronic disease 03/02/2017  . Seasonal allergic rhinitis due to pollen 03/02/2017  . Gastroesophageal reflux disease 03/02/2017  . Migraine with aura and without status migrainosus, not intractable 03/02/2017  . Diabetes mellitus with no complication ([place]) 81/85/6314  . Familial multiple lipoprotein-type hyperlipidemia 04/03/2015  .  Laboratory animal allergy 04/03/2015  . Cephalalgia 04/03/2015  . Essential hypertension 04/03/2015  . Blood glucose elevated 04/03/2015  . Routine general medical examination at a health care facility 04/03/2015  . Coronary artery disease 04/03/2015    Allergies  Allergen Reactions  . Aspartame Other (See Comments)  . Compazine [Prochlorperazine]   . Penicillins   . Sulfa Antibiotics   . Sulfamethoxazole-Trimethoprim Hives and Other (See Comments)    Other Reaction: Not Assessed     Past Surgical History:  Procedure Laterality Date  . back surgery x 2    . c-section x 3    . COLONOSCOPY  2011   cleared for 5 yrs- Dr Beckey Downing  . KNEE ARTHROSCOPY Right 2010  . VAGINAL HYSTERECTOMY  12/14/1994    Social History   Tobacco Use  . Smoking status: Never Smoker  . Smokeless tobacco: Never Used  Substance Use Topics  . Alcohol use: No    Alcohol/week: 0.0 standard drinks  . Drug use: No     Medication list has been reviewed and updated.  Current Meds  Medication Sig  . amLODipine (NORVASC) 5 MG tablet Take 1 tablet (5 mg total) by mouth daily.  Marland Kitchen aspirin 81 MG tablet Take 1 tablet by mouth daily.  Marland Kitchen BLOOD GLUCOSE MONITORING SUPPL 1 application daily.  . cetirizine (ZYRTEC) 10 MG tablet Take 10 mg by mouth daily. otc  . Cholecalciferol (VITAMIN D3) 1000 units CAPS Take 1 capsule by mouth daily.  . Coenzyme Q10 (EQL COQ10) 300 MG CAPS Take 1 capsule by mouth daily.  . Dulaglutide (TRULICITY) 9.70 YO/3.7CH SOPN Inject 1 each into the skin once a week. Dr Eddie Dibbles   . estradiol (ESTRING) 2 MG vaginal ring Place 2 mg vaginally every 3 (three) months. follow package directions- GYN  . fluticasone (FLONASE) 50 MCG/ACT nasal  spray USE 2 SPRAYS IN EACH NOSTRIL AT BEDTIME  . gabapentin (NEURONTIN) 300 MG capsule Take 2 capsules by mouth 3 (three) times daily. [facility]- may take 3 capsules 3 times per day per pt  . hydrochlorothiazide (HYDRODIURIL) 25 MG tablet Take 1 tablet (25 mg  total) by mouth every morning.  Marland Kitchen HYDROcodone-acetaminophen (NORCO) 10-325 MG per tablet Take 1 tablet by mouth as needed. [facility]  . Lancets (ACCU-CHEK MULTICLIX) lancets USE 1 LANCET DAILY  . Lido-Capsaicin-Men-Methyl Sal 0.5-0.035-5-20 % PTCH Apply 1 application topically as needed. [facility]  . lisinopril (PRINIVIL,ZESTRIL) 10 MG tablet Take 1 tablet (10 mg total) by mouth daily.  . Magnesium 500 MG CAPS Take 1 capsule by mouth daily.  . metFORMIN (GLUCOPHAGE) 500 MG tablet Take 1,000 mg by mouth 2 (two) times daily. Dr Eddie Dibbles  . metoprolol succinate (TOPROL-XL) 25 MG 24 hr tablet Take 1 tablet (25 mg total) by mouth daily. Cardio- takes with 50mg = 75  . metoprolol succinate (TOPROL-XL) 50 MG 24 hr tablet TAKE 1 TABLET DAILY WITH OR IMMEDIATELY FOLLOWING A MEAL  . montelukast (SINGULAIR) 10 MG tablet Take 1 tablet (10 mg total) by mouth daily.  . Multiple Vitamins-Minerals (CENTRUM SILVER ULTRA WOMENS PO) Take 1 tablet by mouth daily.  . nitroGLYCERIN (NITROSTAT) 0.4 MG SL tablet Place 1 tablet under the tongue as needed.  . OXYCONTIN 10 MG 12 hr tablet Take 10 mg by mouth 2 (two) times daily. Pain Dr  . pantoprazole (PROTONIX) 40 MG tablet Take 1 tablet (40 mg total) by mouth daily.  . rosuvastatin (CRESTOR) 20 MG tablet TAKE 1 TABLET DAILY, DOCTOR KOMADA  . SUMAtriptan (IMITREX) 50 MG tablet Take 1 tablet (50 mg total) by mouth as needed. Per 90 days  . tiZANidine (ZANAFLEX) 4 MG capsule Take 1 capsule by mouth as needed. [facility]  . vitamin B-12 (CYANOCOBALAMIN) 1000 MCG tablet Take 1,000 mcg by mouth daily.    PHQ 2/9 Scores 10/10/2018 11/15/2017 09/06/2017 09/02/2016  PHQ - 2 Score 0 0 0 0  PHQ- 9 Score 0 - 0 -    Physical Exam  Constitutional: She is oriented to person, place, and time. Vital signs are normal. She appears well-developed and well-nourished.  HENT:  Head: Normocephalic.  Right Ear: Hearing, tympanic membrane, external ear and ear canal normal.  Left Ear: Hearing,  tympanic membrane, external ear and ear canal normal.  Nose: Nose normal.  Mouth/Throat: Uvula is midline and oropharynx is clear and moist. No oropharyngeal exudate, posterior oropharyngeal edema or posterior oropharyngeal erythema.  Eyes: Pupils are equal, round, and reactive to light. Conjunctivae, EOM and lids are normal. Lids are everted and swept, no foreign bodies found. Left eye exhibits no hordeolum. No foreign body present in the left eye. Right conjunctiva is not injected. Left conjunctiva is not injected. No scleral icterus.  Fundoscopic exam:      The right eye shows no arteriolar narrowing, no AV nicking and no papilledema.       The left eye shows no arteriolar narrowing, no AV nicking and no papilledema.  Neck: Trachea normal, normal range of motion and full passive range of motion without pain. Neck supple. Normal carotid pulses, no hepatojugular reflux and no JVD present. No tracheal tenderness present. Carotid bruit is not present. No tracheal deviation present. No thyroid mass and no thyromegaly present.  Cardiovascular: Normal rate, regular rhythm, S1 normal, S2 normal, normal heart sounds and intact distal pulses. PMI is not displaced. Exam reveals  no gallop, no S3, no S4, no distant heart sounds and no friction rub.  No murmur heard.  No systolic murmur is present.  No diastolic murmur is present. Pulmonary/Chest: Effort normal and breath sounds normal. No stridor. No respiratory distress. She has no decreased breath sounds. She has no wheezes. She has no rales. Right breast exhibits inverted nipple. Right breast exhibits no mass, no nipple discharge, no skin change and no tenderness. Left breast exhibits tenderness. Left breast exhibits no inverted nipple, no mass, no nipple discharge and no skin change. No breast swelling, tenderness, discharge or bleeding. Breasts are symmetrical.  Left tender/no mass    Abdominal: Soft. Bowel sounds are normal. She exhibits no mass. There  is no hepatosplenomegaly. There is no tenderness. There is no rebound and no guarding. No hernia.  Musculoskeletal: Normal range of motion. She exhibits no edema or tenderness.       Right foot: There is no deformity.       Left foot: There is no deformity.  Feet:  Right Foot:  Protective Sensation: 10 sites tested. 10 sites sensed.  Skin Integrity: Negative for ulcer, blister, skin breakdown, erythema, warmth, callus or dry skin.  Left Foot:  Protective Sensation: 10 sites tested. 8 sites sensed.  Skin Integrity: Negative for ulcer, blister, skin breakdown, erythema, warmth, callus or dry skin.  Lymphadenopathy:       Head (right side): No submandibular adenopathy present.       Head (left side): No submandibular adenopathy present.    She has no cervical adenopathy.       Right cervical: No superficial cervical adenopathy present.      Left cervical: No superficial cervical adenopathy present.  Neurological: She is alert and oriented to person, place, and time. She has normal strength. She displays normal reflexes. A sensory deficit is present. No cranial nerve deficit.  Reflex Scores:      Patellar reflexes are 2+ on the right side and 1+ on the left side.      Achilles reflexes are 2+ on the right side and 2+ on the left side. Skin: Skin is warm, dry and intact. No rash noted.  Psychiatric: She has a normal mood and affect. Her mood appears not anxious. She does not exhibit a depressed mood.    BP 130/70   Pulse 68   Ht 5\' 5"  (1.651 m)   Wt 172 lb (78 kg)   BMI 28.62 kg/m   Assessment and Plan:  1. Annual physical exam No subjective/objective concerns noted. Labs reviewed from previous visit.Nicole Cook is a 67 y.o. female who presents today for her Complete Annual Exam. She feels well. She reports exercising occaisionally. She reports she is sleeping fairly well. Immunizations are reviewed and recommendations provided.   Age appropriate screening tests are discussed.  Counseling given for risk factor reduction interventions. Pt is using CPAP/BIPAP nightly and is benefiting from CPAP/BIPAP therapy.   2. Colon cancer screening Discussed and noted guaiac negative. - POCT occult blood stool   Dr. Otilio Miu [facility]  11/22/2018

## 2018-11-23 ENCOUNTER — Ambulatory Visit (INDEPENDENT_AMBULATORY_CARE_PROVIDER_SITE_OTHER): Payer: Medicare Other

## 2018-11-23 VITALS — BP 118/72 | HR 72 | Resp 16 | Ht 65.0 in | Wt 172.0 lb

## 2018-11-23 DIAGNOSIS — N952 Postmenopausal atrophic vaginitis: Secondary | ICD-10-CM | POA: Insufficient documentation

## 2018-11-23 DIAGNOSIS — Z1231 Encounter for screening mammogram for malignant neoplasm of breast: Secondary | ICD-10-CM | POA: Diagnosis not present

## 2018-11-23 DIAGNOSIS — Z0000 Encounter for general adult medical examination without abnormal findings: Secondary | ICD-10-CM | POA: Diagnosis not present

## 2018-11-23 DIAGNOSIS — N8189 Other female genital prolapse: Secondary | ICD-10-CM | POA: Insufficient documentation

## 2018-11-23 NOTE — Patient Instructions (Addendum)
[name] , Thank you for taking time to come for your Medicare Wellness Visit. I appreciate your ongoing commitment to your health goals. Please review the following plan we discussed and let me know if I can assist you in the future.   Screening recommendations/referrals: Colonoscopy: done 2019  Mammogram: done 04/12/18 Bone Density: done 03/10/17 Recommended yearly ophthalmology/optometry visit for glaucoma screening and checkup Recommended yearly dental visit for hygiene and checkup  Vaccinations: Influenza vaccine: done 09/26/18  Pneumococcal vaccine: done 08/29/14 Tdap vaccine: done 03/02/17 Shingles vaccine: Shingrix discussed. Please contact your pharmacy for coverage information.     Advanced directives: Advance directive discussed with you today. I have provided a copy for you to complete at home and have notarized. Once this is complete please bring a copy in to our office so we can scan it into your chart.  Conditions/risks identified: Keep up the great work!  Next appointment: Please follow up in one year for your Medicare Annual Wellness visit.     Preventive Care 81 Years and Older, Female Preventive care refers to lifestyle choices and visits with your health care provider that can promote health and wellness. What does preventive care include?  A yearly physical exam. This is also called an annual well check.  Dental exams once or twice a year.  Routine eye exams. Ask your health care provider how often you should have your eyes checked.  Personal lifestyle choices, including:  Daily care of your teeth and gums.  Regular physical activity.  Eating a healthy diet.  Avoiding tobacco and drug use.  Limiting alcohol use.  Practicing safe sex.  Taking low-dose aspirin every day.  Taking vitamin and mineral supplements as recommended by your health care provider. What happens during an annual well check? The services and screenings done by your health care  provider during your annual well check will depend on your age, overall health, lifestyle risk factors, and family history of disease. Counseling  Your health care provider may ask you questions about your:  Alcohol use.  Tobacco use.  Drug use.  Emotional well-being.  Home and relationship well-being.  Sexual activity.  Eating habits.  History of falls.  Memory and ability to understand (cognition).  Work and work Statistician.  Reproductive health. Screening  You may have the following tests or measurements:  Height, weight, and BMI.  Blood pressure.  Lipid and cholesterol levels. These may be checked every 5 years, or more frequently if you are over 77 years old.  Skin check.  Lung cancer screening. You may have this screening every year starting at age 21 if you have a 30-pack-year history of smoking and currently smoke or have quit within the past 15 years.  Fecal occult blood test (FOBT) of the stool. You may have this test every year starting at age 86.  Flexible sigmoidoscopy or colonoscopy. You may have a sigmoidoscopy every 5 years or a colonoscopy every 10 years starting at age 50.  Hepatitis C blood test.  Hepatitis B blood test.  Sexually transmitted disease (STD) testing.  Diabetes screening. This is done by checking your blood sugar (glucose) after you have not eaten for a while (fasting). You may have this done every 1-3 years.  Bone density scan. This is done to screen for osteoporosis. You may have this done starting at age 96.  Mammogram. This may be done every 1-2 years. Talk to your health care provider about how often you should have regular mammograms. Talk with  your health care provider about your test results, treatment options, and if necessary, the need for more tests. Vaccines  Your health care provider may recommend certain vaccines, such as:  Influenza vaccine. This is recommended every year.  Tetanus, diphtheria, and acellular  pertussis (Tdap, Td) vaccine. You may need a Td booster every 10 years.  Zoster vaccine. You may need this after age 54.  Pneumococcal 13-valent conjugate (PCV13) vaccine. One dose is recommended after age 7.  Pneumococcal polysaccharide (PPSV23) vaccine. One dose is recommended after age 34. Talk to your health care provider about which screenings and vaccines you need and how often you need them. This information is not intended to replace advice given to you by your health care provider. Make sure you discuss any questions you have with your health care provider. Document Released: 12/27/2015 Document Revised: 08/19/2016 Document Reviewed: 10/01/2015 Elsevier Interactive Patient Education  2017 [employer] Prevention in the Home Falls can cause injuries. They can happen to people of all ages. There are many things you can do to make your home safe and to help prevent falls. What can I do on the outside of my home?  Regularly fix the edges of walkways and driveways and fix any cracks.  Remove anything that might make you trip as you walk through a door, such as a raised step or threshold.  Trim any bushes or trees on the path to your home.  Use bright outdoor lighting.  Clear any walking paths of anything that might make someone trip, such as rocks or tools.  Regularly check to see if handrails are loose or broken. Make sure that both sides of any steps have handrails.  Any raised decks and porches should have guardrails on the edges.  Have any leaves, snow, or ice cleared regularly.  Use sand or salt on walking paths during winter.  Clean up any spills in your garage right away. This includes oil or grease spills. What can I do in the bathroom?  Use night lights.  Install grab bars by the toilet and in the tub and shower. Do not use towel bars as grab bars.  Use non-skid mats or decals in the tub or shower.  If you need to sit down in the shower, use a plastic,  non-slip stool.  Keep the floor dry. Clean up any water that spills on the floor as soon as it happens.  Remove soap buildup in the tub or shower regularly.  Attach bath mats securely with double-sided non-slip rug tape.  Do not have throw rugs and other things on the floor that can make you trip. What can I do in the bedroom?  Use night lights.  Make sure that you have a light by your bed that is easy to reach.  Do not use any sheets or blankets that are too big for your bed. They should not hang down onto the floor.  Have a firm chair that has side arms. You can use this for support while you get dressed.  Do not have throw rugs and other things on the floor that can make you trip. What can I do in the kitchen?  Clean up any spills right away.  Avoid walking on wet floors.  Keep items that you use a lot in easy-to-reach places.  If you need to reach something above you, use a strong step stool that has a grab bar.  Keep electrical cords out of the way.  Do not  use floor polish or wax that makes floors slippery. If you must use wax, use non-skid floor wax.  Do not have throw rugs and other things on the floor that can make you trip. What can I do with my stairs?  Do not leave any items on the stairs.  Make sure that there are handrails on both sides of the stairs and use them. Fix handrails that are broken or loose. Make sure that handrails are as long as the stairways.  Check any carpeting to make sure that it is firmly attached to the stairs. Fix any carpet that is loose or worn.  Avoid having throw rugs at the top or bottom of the stairs. If you do have throw rugs, attach them to the floor with carpet tape.  Make sure that you have a light switch at the top of the stairs and the bottom of the stairs. If you do not have them, ask someone to add them for you. What else can I do to help prevent falls?  Wear shoes that:  Do not have high heels.  Have rubber  bottoms.  Are comfortable and fit you well.  Are closed at the toe. Do not wear sandals.  If you use a stepladder:  Make sure that it is fully opened. Do not climb a closed stepladder.  Make sure that both sides of the stepladder are locked into place.  Ask someone to hold it for you, if possible.  Clearly mark and make sure that you can see:  Any grab bars or handrails.  First and last steps.  Where the edge of each step is.  Use tools that help you move around (mobility aids) if they are needed. These include:  Canes.  Walkers.  Scooters.  Crutches.  Turn on the lights when you go into a dark area. Replace any light bulbs as soon as they burn out.  Set up your furniture so you have a clear path. Avoid moving your furniture around.  If any of your floors are uneven, fix them.  If there are any pets around you, be aware of where they are.  Review your medicines with your doctor. Some medicines can make you feel dizzy. This can increase your chance of falling. Ask your doctor what other things that you can do to help prevent falls. This information is not intended to replace advice given to you by your health care provider. Make sure you discuss any questions you have with your health care provider. Document Released: 09/26/2009 Document Revised: 05/07/2016 Document Reviewed: 01/04/2015 Elsevier Interactive Patient Education  2017 [employer].

## 2018-11-23 NOTE — Progress Notes (Addendum)
Subjective:   Nicole Cook is a 67 y.o. female who presents for Medicare Annual (Subsequent) preventive examination.  Review of Systems:   Cardiac Risk Factors include: diabetes mellitus;hypertension age > 66 female     Objective:     Vitals: BP 118/72 (BP Location: Left Arm, Patient Position: Sitting, Cuff Size: Normal)   Pulse 72   There is no height or weight on file to calculate BMI.  Advanced Directives 11/23/2018 11/15/2017 10/07/2015 09/02/2015  Does Patient Have a Medical Advance Directive? No No No No  Would patient like information on creating a medical advance directive? Yes (MAU/Ambulatory/Procedural Areas - Information given) Yes (MAU/Ambulatory/Procedural Areas - Information given) No - patient declined information No - patient declined information    Tobacco Social History   Tobacco Use  Smoking Status Never Smoker  Smokeless Tobacco Never Used     Counseling given: Not Answered   Clinical Intake:  Pre-visit preparation completed: Yes  Pain : No/denies pain     Nutritional Status: BMI 25 -29 Overweight Nutritional Risks: None Diabetes: Yes CBG done?: No Did pt. bring in CBG monitor from home?: No   Nutrition Risk Assessment:  Has the patient had any N/V/D within the last 2 months?  No  Does the patient have any non-healing wounds?  No  Has the patient had any unintentional weight loss or weight gain?  No   Diabetes:  Is the patient diabetic?  Yes  If diabetic, was a CBG obtained today?  No  Did the patient bring in their glucometer from home?  No  How often do you monitor your CBG's? daily.   Financial Strains and Diabetes Management:  Are you having any financial strains with the device, your supplies or your medication? No .  Does the patient want to be seen by Chronic Care Management for management of their diabetes?  No  Would the patient like to be referred to a Nutritionist or for Diabetic Management?  No   Diabetic  Exams:  Diabetic Eye Exam: Completed 03/14/18 negative retinopathy.   Diabetic Foot Exam: Completed 11/22/18.   How often do you need to have someone help you when you read instructions, pamphlets, or other written materials from your doctor or pharmacy?: 1 - Never What is the last grade level you completed in school?: bachelor's degree  Interpreter Needed?: No  Information entered by :: Clemetine Marker LPN  Past Medical History:  Diagnosis Date  . Allergy   . Cancer ([place])    basal cell skin ca  . Diabetes mellitus without complication ([place])   . GERD (gastroesophageal reflux disease)   . Hyperlipidemia   . Hypertension   . Migraine    Past Surgical History:  Procedure Laterality Date  . back surgery x 2    . c-section x 3    . COLONOSCOPY  2011   cleared for 5 yrs- Dr Beckey Downing  . KNEE ARTHROSCOPY Right 2010  . VAGINAL HYSTERECTOMY  12/14/1994   Family History  Problem Relation Age of Onset  . Breast cancer Mother 32  . Breast cancer Sister 26  . Breast cancer Paternal Aunt   . Parkinson's disease Father    Social History   Socioeconomic History  . Marital status: Married    Spouse name: Not on file  . Number of children: 3  . Years of education: Not on file  . Highest education level: Bachelor's degree (e.g., BA, AB, BS)  Occupational History  . Occupation: Retired  Social Needs  . Financial resource strain: Not hard at all  . Food insecurity:    Worry: Never true    Inability: Never true  . Transportation needs:    Medical: No    Non-medical: No  Tobacco Use  . Smoking status: Never Smoker  . Smokeless tobacco: Never Used  Substance and Sexual Activity  . Alcohol use: No    Alcohol/week: 0.0 standard drinks  . Drug use: No  . Sexual activity: Yes  Lifestyle  . Physical activity:    Days per week: 4 days    Minutes per session: 60 min  . Stress: Not at all  Relationships  . Social connections:    Talks on phone: More than three times a week    Gets  together: Three times a week    Attends religious service: More than 4 times per year    Active member of club or organization: No    Attends meetings of clubs or organizations: Never    Relationship status: Married  Other Topics Concern  . Not on file  Social History Narrative  . Not on file    Outpatient Encounter Medications as of 11/23/2018  Medication Sig  . ACCU-CHEK FASTCLIX LANCETS MISC Accu-Chek [employer]  . amLODipine (NORVASC) 5 MG tablet Take 1 tablet (5 mg total) by mouth daily.  Marland Kitchen aspirin 81 MG tablet Take 1 tablet by mouth daily.  . cetirizine (ZYRTEC) 10 MG tablet Take 10 mg by mouth daily. otc  . Cholecalciferol (VITAMIN D3) 1000 units CAPS Take 1 capsule by mouth daily.  . Coenzyme Q10 (EQL COQ10) 300 MG CAPS Take 1 capsule by mouth daily.  . Dulaglutide (TRULICITY) 2.20 UR/4.2HC SOPN Inject 1 each into the skin once a week. Dr Eddie Dibbles   . estradiol (ESTRING) 2 MG vaginal ring Place 2 mg vaginally every 3 (three) months. follow package directions- GYN  . fluticasone (FLONASE) 50 MCG/ACT nasal spray USE 2 SPRAYS IN EACH NOSTRIL AT BEDTIME  . gabapentin (NEURONTIN) 300 MG capsule Take 2 capsules by mouth 3 (three) times daily. [facility]- may take 3 capsules 3 times per day per pt  . hydrochlorothiazide (HYDRODIURIL) 25 MG tablet Take 1 tablet (25 mg total) by mouth every morning.  Marland Kitchen HYDROcodone-acetaminophen (NORCO) 10-325 MG per tablet Take 1 tablet by mouth as needed. [facility]  . insulin lispro (HUMALOG KWIKPEN) 100 UNIT/ML KwikPen Humalog KwikPen (U-100) Insulin 100 unit/mL subcutaneous  INJECT 2-8 UNITS BEFORE MEALS AS DIRECTED  . Lido-Capsaicin-Men-Methyl Sal 0.5-0.035-5-20 % PTCH Apply 1 application topically as needed. [facility]  . lidocaine (LIDODERM) 5 % lidocaine 5 % topical patch  APPLY 1 PATCH DAILY (12 HOURS ON AND 12 HOURS OFF)  . lisinopril (PRINIVIL,ZESTRIL) 10 MG tablet Take 1 tablet (10 mg total) by mouth daily.  . Magnesium 500 MG CAPS Take 1  capsule by mouth daily.  . metFORMIN (GLUCOPHAGE) 1000 MG tablet 1 tablet 2 (two) times daily.  . metoprolol succinate (TOPROL-XL) 25 MG 24 hr tablet Take 1 tablet (25 mg total) by mouth daily. Cardio- takes with '50mg'$ = 75  . metoprolol succinate (TOPROL-XL) 50 MG 24 hr tablet TAKE 1 TABLET DAILY WITH OR IMMEDIATELY FOLLOWING A MEAL  . montelukast (SINGULAIR) 10 MG tablet Take 1 tablet (10 mg total) by mouth daily.  . Multiple Vitamins-Minerals (CENTRUM SILVER ULTRA WOMENS PO) Take 1 tablet by mouth daily.  . nitroGLYCERIN (NITROSTAT) 0.4 MG SL tablet Place 1 tablet under the tongue as needed.  Marland Kitchen  ONE TOUCH ULTRA TEST test strip   . OXYCONTIN 10 MG 12 hr tablet Take 10 mg by mouth 2 (two) times daily. Pain Dr  . pantoprazole (PROTONIX) 40 MG tablet Take 1 tablet (40 mg total) by mouth daily.  . rosuvastatin (CRESTOR) 20 MG tablet TAKE 1 TABLET DAILY, DOCTOR KOMADA  . SUMAtriptan (IMITREX) 50 MG tablet Take 1 tablet (50 mg total) by mouth as needed. Per 90 days  . tiZANidine (ZANAFLEX) 4 MG capsule Take 1 capsule by mouth as needed. [facility]  . vitamin B-12 (CYANOCOBALAMIN) 1000 MCG tablet Take 1,000 mcg by mouth daily.  . [DISCONTINUED] BLOOD GLUCOSE MONITORING SUPPL 1 application daily.  . [DISCONTINUED] Lancets (ACCU-CHEK MULTICLIX) lancets USE 1 LANCET DAILY  . [DISCONTINUED] metFORMIN (GLUCOPHAGE) 500 MG tablet Take 1,000 mg by mouth 2 (two) times daily. Dr Eddie Dibbles   No facility-administered encounter medications on file as of 11/23/2018.     Activities of Daily Living In your present state of health, do you have any difficulty performing the following activities: 11/23/2018  Hearing? N  Comment pt declines hearing aids  Vision? N  Difficulty concentrating or making decisions? N  Walking or climbing stairs? N  Dressing or bathing? N  Doing errands, shopping? N  Preparing Food and eating ? N  Using the Toilet? N  In the past six months, have you accidently leaked urine? Y  Comment  wears panty liner for protection  Do you have problems with loss of bowel control? N  Managing your Medications? N  Managing your Finances? N  Housekeeping or managing your Housekeeping? N  Some recent data might be hidden    Patient Care Team: Juline Patch, MD as PCP - General (Family Medicine) Vergia Alcon, MD as Consulting Physician Lindalou Hose Melvyn Novas, MD as Consulting Physician (Occupational Medicine) Enos Fling, MD as Consulting Physician (Obstetrics and Gynecology) Rolena Infante, MD as Consulting Physician (Physical Medicine and Rehabilitation) Elease Etienne, MD as Consulting Physician (Internal Medicine)    Assessment:   This is a routine wellness examination for Ramona.  Exercise Activities and Dietary recommendations Current Exercise Habits: Structured exercise class, Type of exercise: walking, Time (Minutes): 60, Frequency (Times/Week): 4, Weekly Exercise (Minutes/Week): 240, Intensity: Mild  Goals    . DIET - INCREASE WATER INTAKE     Recommend to drink at least 6-8 8oz glasses of water per day        Fall Risk Fall Risk  11/23/2018 10/10/2018 11/15/2017 09/06/2017 09/02/2016  Falls in the past year? 0 No Yes No No  Number falls in past yr: 0 - 1 - -  Injury with Fall? - - No - -  Risk for fall due to : - - Other (Comment) - -  Risk for fall due to: Comment - - N/A - -  Follow up - - Education provided;Falls prevention discussed - -   FALL RISK PREVENTION PERTAINING TO THE HOME:  Any stairs in or around the home WITH handrails? Yes  Home free of loose throw rugs in walkways, pet beds, electrical cords, etc? Yes  Adequate lighting in your home to reduce risk of falls? Yes   ASSISTIVE DEVICES UTILIZED TO PREVENT FALLS:  Life alert? No  Use of a cane, walker or w/c? No  Grab bars in the bathroom? Yes  Shower chair or bench in shower? No  Elevated toilet seat or a handicapped toilet? No   DME ORDERS:  DME order needed?  No   TIMED  UP AND  GO:  Was the test performed? Yes .  Length of time to ambulate 10 feet: 5 sec.   GAIT:  Appearance of gait: Gait stead-fast and without the use of an assistive device.   Education: Fall risk prevention has been discussed.  Intervention(s) required? No   Depression Screen PHQ 2/9 Scores 11/23/2018 10/10/2018 11/15/2017 09/06/2017  PHQ - 2 Score 0 0 0 0  PHQ- 9 Score 1 0 - 0     Cognitive Function pt declined 6CIT - pt made urgent dentist appt for broken tooth  6CIT Screen 11/15/2017  What Year? 0 points  What month? 0 points  What time? 0 points  Count back from 20 0 points  Months in reverse 0 points  Repeat phrase 4 points  Total Score 4    Immunization History  Administered Date(s) Administered  . Hepatitis B 12/14/1998  . Influenza, High Dose Seasonal PF 09/06/2017, 09/26/2018  . Influenza,inj,Quad PF,6+ Mos 09/02/2015, 09/02/2016  . Influenza-Unspecified 09/13/2012, 08/29/2013, 01/10/2015, 02/10/2015, 09/02/2015, 09/02/2016  . MMR 05/14/1981  . Pneumococcal Polysaccharide-23 08/29/2014  . Pneumococcal-Unspecified 05/14/2009, 08/14/2014  . Td 12/14/2005  . Tdap 01/14/2007, 03/02/2017  . Zoster 01/14/2015, 02/10/2015    Qualifies for Shingles Vaccine? Yes  Zostavax completed 2016. Due for Shingrix. Education has been provided regarding the importance of this vaccine. Pt has been advised to call insurance company to determine out of pocket expense. Advised may also receive vaccine at local pharmacy or Health Dept. Verbalized acceptance and understanding.  Tdap: Up to date  Flu Vaccine: Up to date  Pneumococcal Vaccine: Up to date   Screening Tests Health Maintenance  Topic Date Due  . FOOT EXAM  01/04/2019 (Originally 11/17/2018)  . OPHTHALMOLOGY EXAM  03/15/2019  . HEMOGLOBIN A1C  04/11/2019  . MAMMOGRAM  04/13/2019  . TETANUS/TDAP  03/03/2027  . COLONOSCOPY  01/25/2028  . INFLUENZA VACCINE  Completed  . DEXA SCAN  Completed  . Hepatitis C Screening   Completed  . PNA vac Low Risk Adult  Completed    Cancer Screenings:  Colorectal Screening: Completed 01/24/18. Repeat every 10 years;   Mammogram: Completed 04/12/18. Repeat every year; Annual CPE completed 11/22/18 Mammo ordered today.  Bone Density: Completed 03/10/17. Results reflect NORMAL, . Repeat every 2 years.   Lung Cancer Screening: (Low Dose CT Chest recommended if Age 68-80 years, 30 pack-year currently smoking OR have quit w/in 15years.) does not qualify.    Additional Screening:  Hepatitis C Screening: does qualify; Completed 03/02/16  Vision Screening: Recommended annual ophthalmology exams for early detection of glaucoma and other disorders of the eye. Is the patient up to date with their annual eye exam?  Yes  Who is the provider or what is the name of the office in which the pt attends annual eye exams? [place]  Dental Screening: Recommended annual dental exams for proper oral hygiene  Community Resource Referral:  CRR required this visit?  No      Plan:    I have personally reviewed and addressed the Medicare Annual Wellness questionnaire and have noted the following in the patient's chart:  A. Medical and social history B. Use of alcohol, tobacco or illicit drugs  C. Current medications and supplements D. Functional ability and status E.  Nutritional status F.  Physical activity G. Advance directives H. List of other physicians I.  Hospitalizations, surgeries, and ER visits in previous 12 months J.  [place] such as hearing and vision if  needed, cognitive and depression L. Referrals and appointments   In addition, I have reviewed and discussed with patient certain preventive protocols, quality metrics, and best practice recommendations. A written personalized care plan for preventive services as well as general preventive health recommendations were provided to patient.   Signed,  Clemetine Marker, LPN Nurse Health Advisor   Nurse  Notes: none

## 2018-12-20 DIAGNOSIS — E119 Type 2 diabetes mellitus without complications: Secondary | ICD-10-CM | POA: Diagnosis not present

## 2018-12-20 DIAGNOSIS — E538 Deficiency of other specified B group vitamins: Secondary | ICD-10-CM | POA: Diagnosis not present

## 2018-12-23 DIAGNOSIS — I10 Essential (primary) hypertension: Secondary | ICD-10-CM | POA: Diagnosis not present

## 2018-12-23 DIAGNOSIS — E1159 Type 2 diabetes mellitus with other circulatory complications: Secondary | ICD-10-CM | POA: Diagnosis not present

## 2018-12-23 DIAGNOSIS — E1169 Type 2 diabetes mellitus with other specified complication: Secondary | ICD-10-CM | POA: Diagnosis not present

## 2018-12-23 DIAGNOSIS — E785 Hyperlipidemia, unspecified: Secondary | ICD-10-CM | POA: Diagnosis not present

## 2018-12-23 DIAGNOSIS — E1142 Type 2 diabetes mellitus with diabetic polyneuropathy: Secondary | ICD-10-CM | POA: Diagnosis not present

## 2019-01-07 ENCOUNTER — Other Ambulatory Visit: Payer: Self-pay | Admitting: Family Medicine

## 2019-01-07 DIAGNOSIS — I10 Essential (primary) hypertension: Secondary | ICD-10-CM

## 2019-02-22 DIAGNOSIS — M1711 Unilateral primary osteoarthritis, right knee: Secondary | ICD-10-CM | POA: Diagnosis not present

## 2019-02-23 DIAGNOSIS — M1711 Unilateral primary osteoarthritis, right knee: Secondary | ICD-10-CM | POA: Diagnosis not present

## 2019-03-24 ENCOUNTER — Ambulatory Visit (INDEPENDENT_AMBULATORY_CARE_PROVIDER_SITE_OTHER): Payer: Medicare Other | Admitting: Family Medicine

## 2019-03-24 ENCOUNTER — Encounter: Payer: Self-pay | Admitting: Family Medicine

## 2019-03-24 ENCOUNTER — Other Ambulatory Visit: Payer: Self-pay

## 2019-03-24 VITALS — Temp 97.8°F | Ht 65.0 in | Wt 168.0 lb

## 2019-03-24 DIAGNOSIS — N3090 Cystitis, unspecified without hematuria: Secondary | ICD-10-CM | POA: Diagnosis not present

## 2019-03-24 MED ORDER — FLUCONAZOLE 150 MG PO TABS: 150.0000 mg | ORAL_TABLET | Freq: Once | ORAL | 0 refills | Status: AC

## 2019-03-24 MED ORDER — NITROFURANTOIN MONOHYD MACRO 100 MG PO CAPS: 100.0000 mg | ORAL_CAPSULE | Freq: Two times a day (BID) | ORAL | 0 refills | Status: DC

## 2019-03-24 NOTE — Progress Notes (Addendum)
Date:  03/24/2019   Name:  Nicole Cook   DOB:  25-Sep-1951   MRN:  [id number]   Chief Complaint: Urinary Tract Infection (urgency, frequency, pressure at end of stream. Has been drinking water, helped some)  I connected withthis patient, Nicole Cook, by telephoneat the patient's home.  I verified that I am speaking with the correct person using two identifiers. This visit was conducted via telephone due to the Covid-19 outbreak from my office at [facility] in [place], [place]. I discussed the limitations, risks, security and privacy concerns of performing an evaluation and management service by telephone. I also discussed with the patient that there may be a patient responsible charge related to this service. The patient expressed understanding and agreed to proceed.  Urinary Tract Infection   This is a new problem. The current episode started in the past 7 days (wednesday). The problem occurs every urination. The problem has been unchanged. The quality of the pain is described as burning (a pinch). The pain is mild. There has been no fever. There is no history of pyelonephritis. Associated symptoms include chills, frequency and urgency. Pertinent negatives include no discharge, flank pain, hematuria, hesitancy, nausea, sweats or vomiting. She has tried increased fluids for the symptoms.    Review of Systems  Constitutional: Positive for chills. Negative for fatigue, fever and unexpected weight change.  HENT: Negative for congestion, ear discharge, ear pain, rhinorrhea, sinus pressure, sneezing and sore throat.   Eyes: Negative for photophobia, pain, discharge, redness and itching.  Respiratory: Negative for cough, shortness of breath, wheezing and stridor.   Gastrointestinal: Negative for abdominal pain, blood in stool, constipation, diarrhea, nausea and vomiting.  Endocrine: Negative for cold intolerance, heat intolerance, polydipsia, polyphagia and polyuria.  Genitourinary:  Positive for frequency and urgency. Negative for dysuria, flank pain, hematuria, hesitancy, menstrual problem, pelvic pain, vaginal bleeding and vaginal discharge.  Musculoskeletal: Negative for arthralgias, back pain and myalgias.  Skin: Negative for rash.  Allergic/Immunologic: Negative for environmental allergies and food allergies.  Neurological: Negative for dizziness, weakness, light-headedness, numbness and headaches.  Hematological: Negative for adenopathy. Does not bruise/bleed easily.  Psychiatric/Behavioral: Negative for dysphoric mood. The patient is not nervous/anxious.     Patient Active Problem List   Diagnosis Date Noted  . Atrophic vaginitis 11/23/2018  . Pelvic floor relaxation 11/23/2018  . Primary osteoarthritis of right knee 05/12/2018  . Sacral back pain 08/04/2017  . Taking medication for chronic disease 03/02/2017  . Seasonal allergic rhinitis due to pollen 03/02/2017  . Gastroesophageal reflux disease 03/02/2017  . Migraine with aura and without status migrainosus, not intractable 03/02/2017  . Angina pectoris ([place]) 11/09/2016  . Chronic right-sided low back pain without sciatica 10/29/2016  . Trochanteric bursitis of right hip 07/31/2015  . Primary osteoarthritis of right hip 07/31/2015  . Type 2 diabetes mellitus with hyperglycemia, with long-term current use of insulin ([place]) 04/03/2015  . Familial multiple lipoprotein-type hyperlipidemia 04/03/2015  . Laboratory animal allergy 04/03/2015  . Cephalalgia 04/03/2015  . Essential hypertension 04/03/2015  . Blood glucose elevated 04/03/2015  . Routine general medical examination at a health care facility 04/03/2015  . CAD (coronary artery disease) 04/03/2015  . Dermatochalasis of both upper eyelids 09/20/2014  . Sleep apnea 09/03/2014  . DDD (degenerative disc disease), lumbosacral 03/03/2012  . Spinal stenosis of lumbar region without neurogenic claudication 03/03/2012  . Thoracic or lumbosacral neuritis or  radiculitis, unspecified 03/03/2012    Allergies  Allergen Reactions  . Aspartame  Other (See Comments)  . Compazine [Prochlorperazine]   . Penicillins   . Sulfa Antibiotics   . Sulfamethoxazole-Trimethoprim Hives and Other (See Comments)    Other Reaction: Not Assessed     Past Surgical History:  Procedure Laterality Date  . back surgery x 2    . c-section x 3    . COLONOSCOPY  2011   cleared for 5 yrs- Dr Beckey Downing  . KNEE ARTHROSCOPY Right 2010  . VAGINAL HYSTERECTOMY  12/14/1994    Social History   Tobacco Use  . Smoking status: Never Smoker  . Smokeless tobacco: Never Used  Substance Use Topics  . Alcohol use: No    Alcohol/week: 0.0 standard drinks  . Drug use: No     Medication list has been reviewed and updated.  Current Meds  Medication Sig  . ACCU-CHEK FASTCLIX LANCETS MISC Accu-Chek [employer]  . amLODipine (NORVASC) 5 MG tablet TAKE 1 TABLET DAILY  . aspirin 81 MG tablet Take 1 tablet by mouth daily.  . cetirizine (ZYRTEC) 10 MG tablet Take 10 mg by mouth daily. otc  . Cholecalciferol (VITAMIN D3) 1000 units CAPS Take 1 capsule by mouth daily.  . Coenzyme Q10 (EQL COQ10) 300 MG CAPS Take 1 capsule by mouth daily.  . Dulaglutide (TRULICITY) 3.81 OF/7.5ZW SOPN Inject 1 each into the skin once a week. Dr Eddie Dibbles   . estradiol (ESTRING) 2 MG vaginal ring Place 2 mg vaginally every 3 (three) months. follow package directions- GYN  . fluticasone (FLONASE) 50 MCG/ACT nasal spray USE 2 SPRAYS IN EACH NOSTRIL AT BEDTIME  . gabapentin (NEURONTIN) 300 MG capsule Take 2 capsules by mouth 3 (three) times daily. [facility]- may take 3 capsules 3 times per day per pt  . hydrochlorothiazide (HYDRODIURIL) 25 MG tablet TAKE 1 TABLET EVERY MORNING  . HYDROcodone-acetaminophen (NORCO) 10-325 MG per tablet Take 1 tablet by mouth as needed. [facility]  . insulin lispro (HUMALOG KWIKPEN) 100 UNIT/ML KwikPen Humalog KwikPen (U-100) Insulin 100 unit/mL subcutaneous  INJECT  2-8 UNITS BEFORE MEALS AS DIRECTED  . Lido-Capsaicin-Men-Methyl Sal 0.5-0.035-5-20 % PTCH Apply 1 application topically as needed. [facility]  . lidocaine (LIDODERM) 5 % lidocaine 5 % topical patch  APPLY 1 PATCH DAILY (12 HOURS ON AND 12 HOURS OFF)  . lisinopril (PRINIVIL,ZESTRIL) 10 MG tablet Take 1 tablet (10 mg total) by mouth daily.  . Magnesium 500 MG CAPS Take 1 capsule by mouth daily.  . metFORMIN (GLUCOPHAGE) 1000 MG tablet 1 tablet 2 (two) times daily.  . metoprolol succinate (TOPROL-XL) 25 MG 24 hr tablet Take 1 tablet (25 mg total) by mouth daily. Cardio- takes with 50mg = 75  . metoprolol succinate (TOPROL-XL) 50 MG 24 hr tablet TAKE 1 TABLET DAILY WITH OR IMMEDIATELY FOLLOWING A MEAL  . montelukast (SINGULAIR) 10 MG tablet Take 1 tablet (10 mg total) by mouth daily.  . Multiple Vitamins-Minerals (CENTRUM SILVER ULTRA WOMENS PO) Take 1 tablet by mouth daily.  . nitroGLYCERIN (NITROSTAT) 0.4 MG SL tablet Place 1 tablet under the tongue as needed.  . ONE TOUCH ULTRA TEST test strip   . OXYCONTIN 10 MG 12 hr tablet Take 10 mg by mouth 2 (two) times daily. Pain Dr  . pantoprazole (PROTONIX) 40 MG tablet Take 1 tablet (40 mg total) by mouth daily.  . rosuvastatin (CRESTOR) 20 MG tablet TAKE 1 TABLET DAILY, DOCTOR KOMADA  . SUMAtriptan (IMITREX) 50 MG tablet Take 1 tablet (50 mg total) by mouth as needed. Per  90 days  . tiZANidine (ZANAFLEX) 4 MG capsule Take 1 capsule by mouth as needed. [facility]  . vitamin B-12 (CYANOCOBALAMIN) 1000 MCG tablet Take 1,000 mcg by mouth daily.    PHQ 2/9 Scores 11/23/2018 10/10/2018 11/15/2017 09/06/2017  PHQ - 2 Score 0 0 0 0  PHQ- 9 Score 1 0 - 0    BP Readings from Last 3 Encounters:  11/23/18 118/72  11/22/18 130/70  10/10/18 120/70    Physical Exam Vitals signs and nursing note reviewed.  Constitutional:      General: She is not in acute distress.    Appearance: She is not diaphoretic.  Neck:     Thyroid: No thyromegaly.     Vascular: No  JVD.  Abdominal:     Tenderness: There is abdominal tenderness. There is left CVA tenderness.  Neurological:     Mental Status: She is alert.     Deep Tendon Reflexes: Reflexes are normal and symmetric.     Wt Readings from Last 3 Encounters:  03/24/19 168 lb (76.2 kg)  11/23/18 172 lb (78 kg)  11/22/18 172 lb (78 kg)    Temp 97.8 F (36.6 C) (Oral)   Ht 5\' 5"  (1.651 m)   Wt 168 lb (76.2 kg)   BMI 27.96 kg/m   Assessment and Plan:

## 2019-05-01 DIAGNOSIS — E1159 Type 2 diabetes mellitus with other circulatory complications: Secondary | ICD-10-CM | POA: Diagnosis not present

## 2019-05-01 DIAGNOSIS — E1142 Type 2 diabetes mellitus with diabetic polyneuropathy: Secondary | ICD-10-CM | POA: Diagnosis not present

## 2019-05-01 DIAGNOSIS — E785 Hyperlipidemia, unspecified: Secondary | ICD-10-CM | POA: Diagnosis not present

## 2019-05-01 DIAGNOSIS — E1169 Type 2 diabetes mellitus with other specified complication: Secondary | ICD-10-CM | POA: Diagnosis not present

## 2019-05-01 DIAGNOSIS — I10 Essential (primary) hypertension: Secondary | ICD-10-CM | POA: Diagnosis not present

## 2019-05-11 ENCOUNTER — Other Ambulatory Visit: Payer: Self-pay | Admitting: Family Medicine

## 2019-05-11 DIAGNOSIS — I10 Essential (primary) hypertension: Secondary | ICD-10-CM

## 2019-05-11 DIAGNOSIS — J301 Allergic rhinitis due to pollen: Secondary | ICD-10-CM

## 2019-05-11 DIAGNOSIS — K219 Gastro-esophageal reflux disease without esophagitis: Secondary | ICD-10-CM

## 2019-05-11 DIAGNOSIS — E785 Hyperlipidemia, unspecified: Secondary | ICD-10-CM

## 2019-05-12 DIAGNOSIS — M25561 Pain in right knee: Secondary | ICD-10-CM | POA: Diagnosis not present

## 2019-05-17 DIAGNOSIS — R262 Difficulty in walking, not elsewhere classified: Secondary | ICD-10-CM | POA: Diagnosis not present

## 2019-05-17 DIAGNOSIS — M25561 Pain in right knee: Secondary | ICD-10-CM | POA: Diagnosis not present

## 2019-05-19 DIAGNOSIS — M25561 Pain in right knee: Secondary | ICD-10-CM | POA: Diagnosis not present

## 2019-05-19 DIAGNOSIS — R262 Difficulty in walking, not elsewhere classified: Secondary | ICD-10-CM | POA: Diagnosis not present

## 2019-05-22 ENCOUNTER — Inpatient Hospital Stay: Admission: RE | Admit: 2019-05-22 | Payer: Medicare Other | Source: Ambulatory Visit

## 2019-05-22 DIAGNOSIS — R262 Difficulty in walking, not elsewhere classified: Secondary | ICD-10-CM | POA: Diagnosis not present

## 2019-05-22 DIAGNOSIS — M25561 Pain in right knee: Secondary | ICD-10-CM | POA: Diagnosis not present

## 2019-05-24 ENCOUNTER — Other Ambulatory Visit: Payer: Self-pay

## 2019-05-24 ENCOUNTER — Ambulatory Visit (INDEPENDENT_AMBULATORY_CARE_PROVIDER_SITE_OTHER): Payer: Medicare Other | Admitting: Family Medicine

## 2019-05-24 ENCOUNTER — Encounter: Payer: Self-pay | Admitting: Family Medicine

## 2019-05-24 VITALS — BP 118/64 | HR 64 | Ht 65.0 in | Wt 166.0 lb

## 2019-05-24 DIAGNOSIS — I10 Essential (primary) hypertension: Secondary | ICD-10-CM

## 2019-05-24 DIAGNOSIS — K219 Gastro-esophageal reflux disease without esophagitis: Secondary | ICD-10-CM | POA: Diagnosis not present

## 2019-05-24 DIAGNOSIS — E785 Hyperlipidemia, unspecified: Secondary | ICD-10-CM

## 2019-05-24 DIAGNOSIS — J301 Allergic rhinitis due to pollen: Secondary | ICD-10-CM

## 2019-05-24 DIAGNOSIS — G43109 Migraine with aura, not intractable, without status migrainosus: Secondary | ICD-10-CM | POA: Diagnosis not present

## 2019-05-24 DIAGNOSIS — R69 Illness, unspecified: Secondary | ICD-10-CM | POA: Diagnosis not present

## 2019-05-24 MED ORDER — FLUTICASONE PROPIONATE 50 MCG/ACT NA SUSP: NASAL | 3 refills | Status: DC

## 2019-05-24 MED ORDER — LISINOPRIL 10 MG PO TABS: 10.0000 mg | ORAL_TABLET | Freq: Every day | ORAL | 1 refills | Status: DC

## 2019-05-24 MED ORDER — AMLODIPINE BESYLATE 5 MG PO TABS: 5.0000 mg | ORAL_TABLET | Freq: Every day | ORAL | 1 refills | Status: DC

## 2019-05-24 MED ORDER — PANTOPRAZOLE SODIUM 40 MG PO TBEC: 40.0000 mg | DELAYED_RELEASE_TABLET | Freq: Every day | ORAL | 1 refills | Status: DC

## 2019-05-24 MED ORDER — HYDROCHLOROTHIAZIDE 25 MG PO TABS: 25.0000 mg | ORAL_TABLET | Freq: Every morning | ORAL | 1 refills | Status: DC

## 2019-05-24 MED ORDER — METOPROLOL SUCCINATE ER 50 MG PO TB24: ORAL_TABLET | ORAL | 1 refills | Status: DC

## 2019-05-24 MED ORDER — ROSUVASTATIN CALCIUM 20 MG PO TABS: ORAL_TABLET | ORAL | 3 refills | Status: DC

## 2019-05-24 MED ORDER — METOPROLOL SUCCINATE ER 25 MG PO TB24: 25.0000 mg | ORAL_TABLET | Freq: Every day | ORAL | 1 refills | Status: DC

## 2019-05-24 MED ORDER — SUMATRIPTAN SUCCINATE 50 MG PO TABS: 50.0000 mg | ORAL_TABLET | ORAL | 1 refills | Status: DC | PRN

## 2019-05-24 MED ORDER — MONTELUKAST SODIUM 10 MG PO TABS: 10.0000 mg | ORAL_TABLET | Freq: Every day | ORAL | 1 refills | Status: DC

## 2019-05-24 NOTE — Progress Notes (Signed)
Date:  05/24/2019   Name:  Nicole Cook   DOB:  Feb 11, 1951   MRN:  [id number]   Chief Complaint: Hypertension; Headache; Allergic Rhinitis ; Hyperlipidemia; and Gastroesophageal Reflux  Hypertension  This is a chronic problem. The current episode started more than 1 year ago. The problem is unchanged. The problem is controlled. Associated symptoms include headaches. Pertinent negatives include no anxiety, blurred vision, chest pain, malaise/fatigue, neck pain, orthopnea, palpitations, peripheral edema, PND, shortness of breath or sweats. There are no associated agents to hypertension. There are no known risk factors for coronary artery disease. Past treatments include ACE inhibitors, beta blockers, diuretics and calcium channel blockers. The current treatment provides moderate improvement. There are no compliance problems.  There is no history of angina, kidney disease, CAD/MI, CVA, heart failure, left ventricular hypertrophy, PVD or retinopathy. There is no history of chronic renal disease, a hypertension causing med or renovascular disease.  Headache   This is a chronic (migraine) problem. The current episode started more than 1 year ago. The problem occurs intermittently. The problem has been waxing and waning. The pain is moderate. Pertinent negatives include no abdominal pain, back pain, blurred vision, coughing, dizziness, ear pain, eye pain, eye redness, fever, nausea, neck pain, numbness, photophobia, rhinorrhea, sinus pressure, sore throat, vomiting or weakness. Treatments tried: imetrex. Her past medical history is significant for hypertension. There is no history of obesity.  Hyperlipidemia  This is a chronic problem. The current episode started more than 1 year ago. The problem is controlled. Recent lipid tests were reviewed and are normal. She has no history of chronic renal disease, diabetes, hypothyroidism, liver disease or obesity. Factors aggravating her hyperlipidemia include  thiazides. Pertinent negatives include no chest pain, focal sensory loss, focal weakness, leg pain, myalgias or shortness of breath. Current antihyperlipidemic treatment includes statins. The current treatment provides moderate improvement of lipids. There are no compliance problems.   Gastroesophageal Reflux  She reports no abdominal pain, no belching, no chest pain, no choking, no coughing, no dysphagia, no early satiety, no globus sensation, no heartburn, no hoarse voice, no nausea, no sore throat, no stridor, no tooth decay, no water brash or no wheezing. This is a chronic problem. The current episode started yesterday. The symptoms are aggravated by certain foods. Pertinent negatives include no fatigue. She has tried a PPI for the symptoms. The treatment provided moderate relief.    Review of Systems  Constitutional: Negative.  Negative for chills, fatigue, fever, malaise/fatigue and unexpected weight change.  HENT: Negative for congestion, ear discharge, ear pain, hoarse voice, rhinorrhea, sinus pressure, sneezing and sore throat.   Eyes: Negative for blurred vision, photophobia, pain, discharge, redness and itching.  Respiratory: Negative for cough, choking, shortness of breath, wheezing and stridor.   Cardiovascular: Negative for chest pain, palpitations, orthopnea and PND.  Gastrointestinal: Negative for abdominal pain, blood in stool, constipation, diarrhea, dysphagia, heartburn, nausea and vomiting.  Endocrine: Negative for cold intolerance, heat intolerance, polydipsia, polyphagia and polyuria.  Genitourinary: Negative for dysuria, flank pain, frequency, hematuria, menstrual problem, pelvic pain, urgency, vaginal bleeding and vaginal discharge.  Musculoskeletal: Negative for arthralgias, back pain, myalgias and neck pain.  Skin: Negative for rash.  Allergic/Immunologic: Negative for environmental allergies and food allergies.  Neurological: Positive for headaches. Negative for dizziness,  focal weakness, weakness, light-headedness and numbness.  Hematological: Negative for adenopathy. Does not bruise/bleed easily.  Psychiatric/Behavioral: Negative for dysphoric mood. The patient is not nervous/anxious.     Patient Active Problem  List   Diagnosis Date Noted   Atrophic vaginitis 11/23/2018   Pelvic floor relaxation 11/23/2018   Primary osteoarthritis of right knee 05/12/2018   Sacral back pain 08/04/2017   Taking medication for chronic disease 03/02/2017   Seasonal allergic rhinitis due to pollen 03/02/2017   Gastroesophageal reflux disease 03/02/2017   Migraine with aura and without status migrainosus, not intractable 03/02/2017   Angina pectoris ([place]) 11/09/2016   Chronic right-sided low back pain without sciatica 10/29/2016   Trochanteric bursitis of right hip 07/31/2015   Primary osteoarthritis of right hip 07/31/2015   Type 2 diabetes mellitus with hyperglycemia, with long-term current use of insulin ([place]) 04/03/2015   Familial multiple lipoprotein-type hyperlipidemia 04/03/2015   Laboratory animal allergy 04/03/2015   Cephalalgia 04/03/2015   Essential hypertension 04/03/2015   Blood glucose elevated 04/03/2015   Routine general medical examination at a health care facility 04/03/2015   CAD (coronary artery disease) 04/03/2015   Dermatochalasis of both upper eyelids 09/20/2014   Sleep apnea 09/03/2014   DDD (degenerative disc disease), lumbosacral 03/03/2012   Spinal stenosis of lumbar region without neurogenic claudication 03/03/2012   Thoracic or lumbosacral neuritis or radiculitis, unspecified 03/03/2012    Allergies  Allergen Reactions   Aspartame Other (See Comments)   Compazine [Prochlorperazine]    Penicillins    Sulfa Antibiotics    Sulfamethoxazole-Trimethoprim Hives and Other (See Comments)    Other Reaction: Not Assessed     Past Surgical History:  Procedure Laterality Date   back surgery x 2     c-section  x 3     COLONOSCOPY  2011   cleared for 5 yrs- Dr Beckey Downing   KNEE ARTHROSCOPY Right 2010   VAGINAL HYSTERECTOMY  12/14/1994    Social History   Tobacco Use   Smoking status: Never Smoker   Smokeless tobacco: Never Used  Substance Use Topics   Alcohol use: No    Alcohol/week: 0.0 standard drinks   Drug use: No     Medication list has been reviewed and updated.  Current Meds  Medication Sig   ACCU-CHEK FASTCLIX LANCETS MISC Accu-Chek Fastclix Lancet Drum   amLODipine (NORVASC) 5 MG tablet TAKE 1 TABLET DAILY   aspirin 81 MG tablet Take 1 tablet by mouth daily.   cetirizine (ZYRTEC) 10 MG tablet Take 10 mg by mouth daily. otc   Cholecalciferol (VITAMIN D3) 1000 units CAPS Take 1 capsule by mouth daily.   Coenzyme Q10 (EQL COQ10) 300 MG CAPS Take 1 capsule by mouth daily.   Dulaglutide (TRULICITY) 2.95 AO/1.3YQ SOPN Inject 1 each into the skin once a week. Dr Eddie Dibbles    estradiol [facility]) 2 MG vaginal ring Place 2 mg vaginally every 3 (three) months. follow package directions- GYN   fluticasone (FLONASE) 50 MCG/ACT nasal spray USE 2 SPRAYS IN EACH NOSTRIL AT BEDTIME   gabapentin (NEURONTIN) 300 MG capsule Take 2 capsules by mouth 3 (three) times daily. [facility]- may take 3 capsules 3 times per day per pt   hydrochlorothiazide (HYDRODIURIL) 25 MG tablet TAKE 1 TABLET EVERY MORNING   HYDROcodone-acetaminophen (NORCO) 10-325 MG per tablet Take 1 tablet by mouth as needed. [facility]   insulin lispro (HUMALOG KWIKPEN) 100 UNIT/ML KwikPen Humalog KwikPen (U-100) Insulin 100 unit/mL subcutaneous  INJECT 2-8 UNITS BEFORE MEALS AS DIRECTED   Lido-Capsaicin-Men-Methyl Sal 0.5-0.035-5-20 % PTCH Apply 1 application topically as needed. [facility]   lidocaine (LIDODERM) 5 % lidocaine 5 % topical patch  APPLY 1 PATCH DAILY (12  HOURS ON AND 12 HOURS OFF)   lisinopril (ZESTRIL) 10 MG tablet TAKE 1 TABLET DAILY   Magnesium 500 MG CAPS Take 1 capsule by mouth daily.    metFORMIN (GLUCOPHAGE) 1000 MG tablet 1 tablet 2 (two) times daily.   metoprolol succinate (TOPROL-XL) 25 MG 24 hr tablet Take 1 tablet (25 mg total) by mouth daily. Cardio- takes with 50mg = 75   metoprolol succinate (TOPROL-XL) 50 MG 24 hr tablet TAKE 1 TABLET DAILY WITH OR IMMEDIATELY FOLLOWING A MEAL   montelukast (SINGULAIR) 10 MG tablet Take 1 tablet (10 mg total) by mouth daily.   Multiple Vitamins-Minerals (CENTRUM SILVER ULTRA WOMENS PO) Take 1 tablet by mouth daily.   nitroGLYCERIN (NITROSTAT) 0.4 MG SL tablet Place 1 tablet under the tongue as needed.   ONE TOUCH ULTRA TEST test strip    OXYCONTIN 10 MG 12 hr tablet Take 10 mg by mouth 2 (two) times daily. Pain Dr   pantoprazole (PROTONIX) 40 MG tablet TAKE 1 TABLET DAILY   rosuvastatin (CRESTOR) 20 MG tablet TAKE 1 TABLET DAILY   SUMAtriptan (IMITREX) 50 MG tablet Take 1 tablet (50 mg total) by mouth as needed. Per 90 days   tiZANidine (ZANAFLEX) 4 MG capsule Take 1 capsule by mouth as needed. [facility]   vitamin B-12 (CYANOCOBALAMIN) 1000 MCG tablet Take 1,000 mcg by mouth daily.   [DISCONTINUED] Dulaglutide 0.75 MG/0.5ML SOPN Inject 0.75 mg into the skin once a week. endo    PHQ 2/9 Scores 05/24/2019 11/23/2018 10/10/2018 11/15/2017  PHQ - 2 Score 0 0 0 0  PHQ- 9 Score 0 1 0 -    BP Readings from Last 3 Encounters:  05/24/19 118/64  11/23/18 118/72  11/22/18 130/70    Physical Exam Vitals signs and nursing note reviewed.  Constitutional:      Appearance: She is well-developed.  HENT:     Head: Normocephalic.     Right Ear: External ear normal.     Left Ear: External ear normal.  Eyes:     General: Lids are everted, no foreign bodies appreciated. No scleral icterus.       Left eye: No foreign body or hordeolum.     Conjunctiva/sclera: Conjunctivae normal.     Right eye: Right conjunctiva is not injected.     Left eye: Left conjunctiva is not injected.     Pupils: Pupils are equal, round, and reactive to  light.  Neck:     Musculoskeletal: Normal range of motion and neck supple.     Thyroid: No thyromegaly.     Vascular: No JVD.     Trachea: No tracheal deviation.  Cardiovascular:     Rate and Rhythm: Normal rate and regular rhythm.     Heart sounds: Normal heart sounds. No murmur. No friction rub. No gallop.   Pulmonary:     Effort: Pulmonary effort is normal. No respiratory distress.     Breath sounds: Normal breath sounds. No wheezing or rales.  Abdominal:     General: Bowel sounds are normal.     Palpations: Abdomen is soft. There is no mass.     Tenderness: There is no abdominal tenderness. There is no guarding or rebound.  Musculoskeletal: Normal range of motion.        General: No tenderness.  Lymphadenopathy:     Cervical: No cervical adenopathy.  Skin:    General: Skin is warm.     Findings: No rash.  Neurological:     Mental Status:  She is alert and oriented to person, place, and time.     Cranial Nerves: No cranial nerve deficit.     Deep Tendon Reflexes: Reflexes normal.  Psychiatric:        Mood and Affect: Mood is not anxious or depressed.     Wt Readings from Last 3 Encounters:  05/24/19 166 lb (75.3 kg)  03/24/19 168 lb (76.2 kg)  11/23/18 172 lb (78 kg)    BP 118/64    Pulse 64    Ht 5\' 5"  (1.651 m)    Wt 166 lb (75.3 kg)    BMI 27.62 kg/m   Assessment and Plan:  1. Essential hypertension .  Controlled.  Continue metoprolol XL 25 mg with 50 mg XL.  Will also continue 10 mg lisinopril as well as 25 mg hydrochlorothiazide.  Will also continue amlodipine 5 mg once a day.  Will check renal function panel.  And recheck in 6 months - Renal Function Panel - metoprolol succinate (TOPROL-XL) 25 MG 24 hr tablet; Take 1 tablet (25 mg total) by mouth daily. Cardio- takes with 50mg = 75  Dispense: 90 tablet; Refill: 1 - metoprolol succinate (TOPROL-XL) 50 MG 24 hr tablet; TAKE 1 TABLET DAILY WITH OR IMMEDIATELY FOLLOWING A MEAL  Dispense: 90 tablet; Refill: 1 -  lisinopril (ZESTRIL) 10 MG tablet; Take 1 tablet (10 mg total) by mouth daily.  Dispense: 90 tablet; Refill: 1 - hydrochlorothiazide (HYDRODIURIL) 25 MG tablet; Take 1 tablet (25 mg total) by mouth every morning.  Dispense: 90 tablet; Refill: 1 - amLODipine (NORVASC) 5 MG tablet; Take 1 tablet (5 mg total) by mouth daily.  Dispense: 90 tablet; Refill: 1  2. Gastroesophageal reflux disease, esophagitis presence not specified .  Controlled.  Continue pantoprazole 40 mg once a day - pantoprazole (PROTONIX) 40 MG tablet; Take 1 tablet (40 mg total) by mouth daily.  Dispense: 90 tablet; Refill: 1  3. Hyperlipidemia, unspecified hyperlipidemia type Chronic.  Controlled.  Continue Crestor 20 mg once a day will recheck lipid panel. - Lipid Panel With LDL/HDL Ratio - rosuvastatin (CRESTOR) 20 MG tablet; TAKE 1 TABLET DAILY  Dispense: 90 tablet; Refill: 3  4. Migraine with aura and without status migrainosus, not intractable Episodic.  Controlled with Imitrex 50 mg as needed as needed - SUMAtriptan (IMITREX) 50 MG tablet; Take 1 tablet (50 mg total) by mouth as needed. Per 90 days  Dispense: 10 tablet; Refill: 1  5. Seasonal allergic rhinitis due to pollen Recurrent.  Continue Singulair 10 mg once a day and Flonase 50 mg nasal spray 1 spray each nostril daily. - montelukast (SINGULAIR) 10 MG tablet; Take 1 tablet (10 mg total) by mouth daily.  Dispense: 90 tablet; Refill: 1 - fluticasone (FLONASE) 50 MCG/ACT nasal spray; One spray each nostril daily  Dispense: 48 g; Refill: 3  6. Taking medication for chronic disease We will check hepatic function panel while patient is on [facility] therapy. - Hepatic function panel

## 2019-05-25 DIAGNOSIS — M25561 Pain in right knee: Secondary | ICD-10-CM | POA: Diagnosis not present

## 2019-05-25 DIAGNOSIS — R262 Difficulty in walking, not elsewhere classified: Secondary | ICD-10-CM | POA: Diagnosis not present

## 2019-05-25 LAB — HEPATIC FUNCTION PANEL
ALT: 20 IU/L (ref 0–32)
AST: 20 IU/L (ref 0–40)
Alkaline Phosphatase: 39 IU/L (ref 39–117)
Bilirubin Total: 0.4 mg/dL (ref 0.0–1.2)
Bilirubin, Direct: 0.13 mg/dL (ref 0.00–0.40)
Total Protein: 7.2 g/dL (ref 6.0–8.5)

## 2019-05-25 LAB — LIPID PANEL WITH LDL/HDL RATIO
Cholesterol, Total: 128 mg/dL (ref 100–199)
HDL: 37 mg/dL — ABNORMAL LOW (ref >39)
LDL Calculated: 48 mg/dL (ref 0–99)
LDl/HDL Ratio: 1.3 ratio (ref 0.0–3.2)
Triglycerides: 213 mg/dL — ABNORMAL HIGH (ref 0–149)
VLDL Cholesterol Cal: 43 mg/dL — ABNORMAL HIGH (ref 5–40)

## 2019-05-25 LAB — RENAL FUNCTION PANEL
Albumin: 4.9 g/dL — ABNORMAL HIGH (ref 3.8–4.8)
BUN/Creatinine Ratio: 22 (ref 12–28)
BUN: 14 mg/dL (ref 8–27)
CO2: 26 mmol/L (ref 20–29)
Calcium: 10.1 mg/dL (ref 8.7–10.3)
Chloride: 97 mmol/L (ref 96–106)
Creatinine, Ser: 0.65 mg/dL (ref 0.57–1.00)
GFR calc Af Amer: 105 mL/min/{1.73_m2} (ref >59)
GFR calc non Af Amer: 91 mL/min/{1.73_m2} (ref >59)
Glucose: 143 mg/dL — ABNORMAL HIGH (ref 65–99)
Phosphorus: 3.7 mg/dL (ref 3.0–4.3)
Potassium: 4.6 mmol/L (ref 3.5–5.2)
Sodium: 140 mmol/L (ref 134–144)

## 2019-05-29 DIAGNOSIS — M25561 Pain in right knee: Secondary | ICD-10-CM | POA: Diagnosis not present

## 2019-05-29 DIAGNOSIS — R262 Difficulty in walking, not elsewhere classified: Secondary | ICD-10-CM | POA: Diagnosis not present

## 2019-05-31 DIAGNOSIS — R262 Difficulty in walking, not elsewhere classified: Secondary | ICD-10-CM | POA: Diagnosis not present

## 2019-05-31 DIAGNOSIS — M25561 Pain in right knee: Secondary | ICD-10-CM | POA: Diagnosis not present

## 2019-06-05 DIAGNOSIS — R262 Difficulty in walking, not elsewhere classified: Secondary | ICD-10-CM | POA: Diagnosis not present

## 2019-06-05 DIAGNOSIS — M25561 Pain in right knee: Secondary | ICD-10-CM | POA: Diagnosis not present

## 2019-06-07 DIAGNOSIS — R262 Difficulty in walking, not elsewhere classified: Secondary | ICD-10-CM | POA: Diagnosis not present

## 2019-06-07 DIAGNOSIS — M25561 Pain in right knee: Secondary | ICD-10-CM | POA: Diagnosis not present

## 2019-06-08 IMAGING — MG MM DIGITAL SCREENING BILAT W/ TOMO W/ CAD
8 series · 8 of 24 positions shown · non-contrast
Comparison: Previous exam(s).

CLINICAL DATA: Screening.

EXAM:
DIGITAL SCREENING BILATERAL MAMMOGRAM WITH TOMO AND CAD

[L MLO synth-2D]
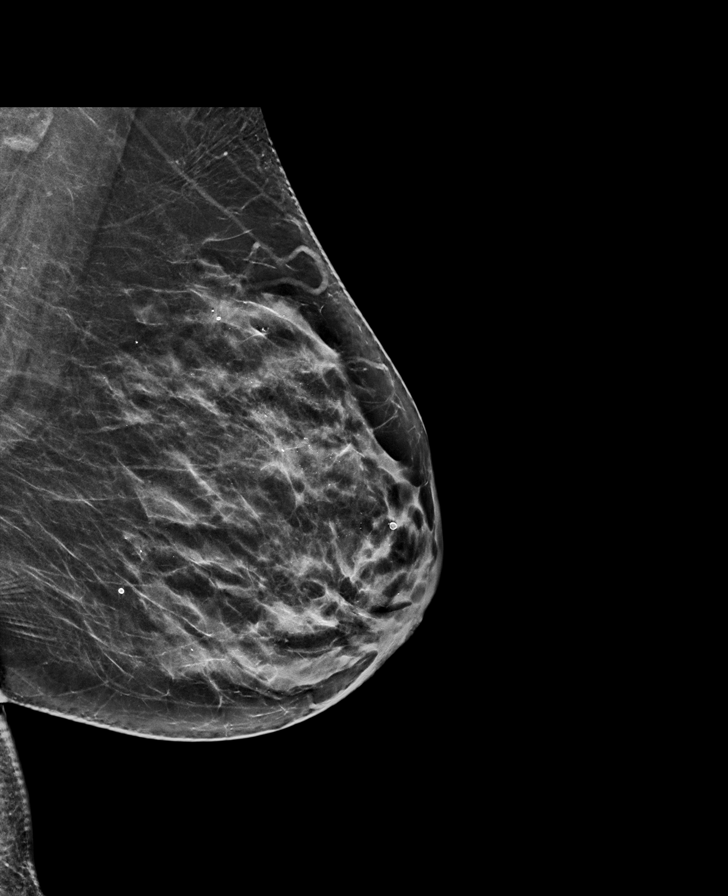

[R CC synth-2D]
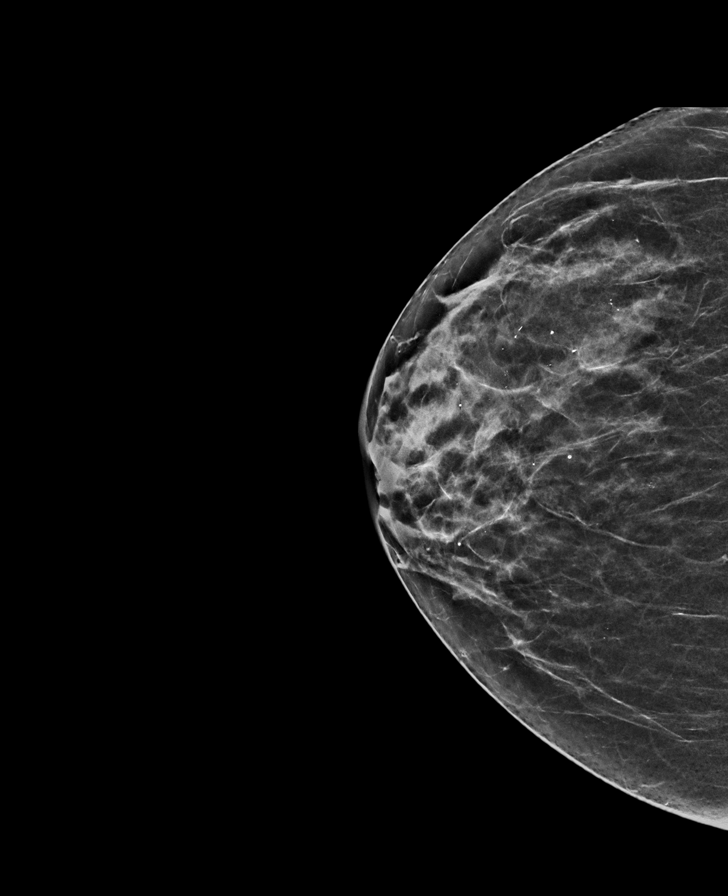

[R MLO synth-2D]
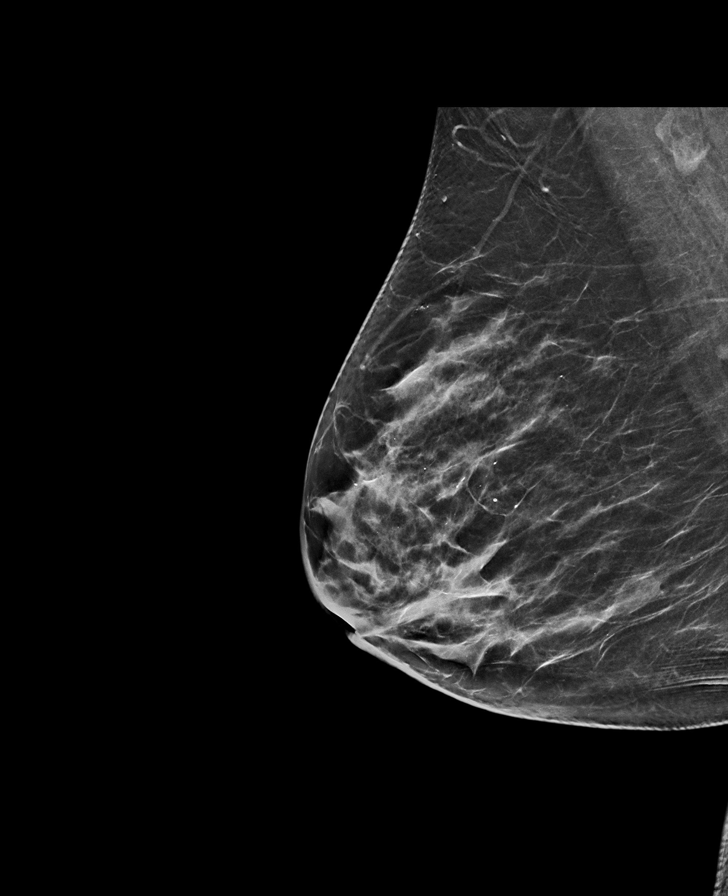

[L CC synth-2D]
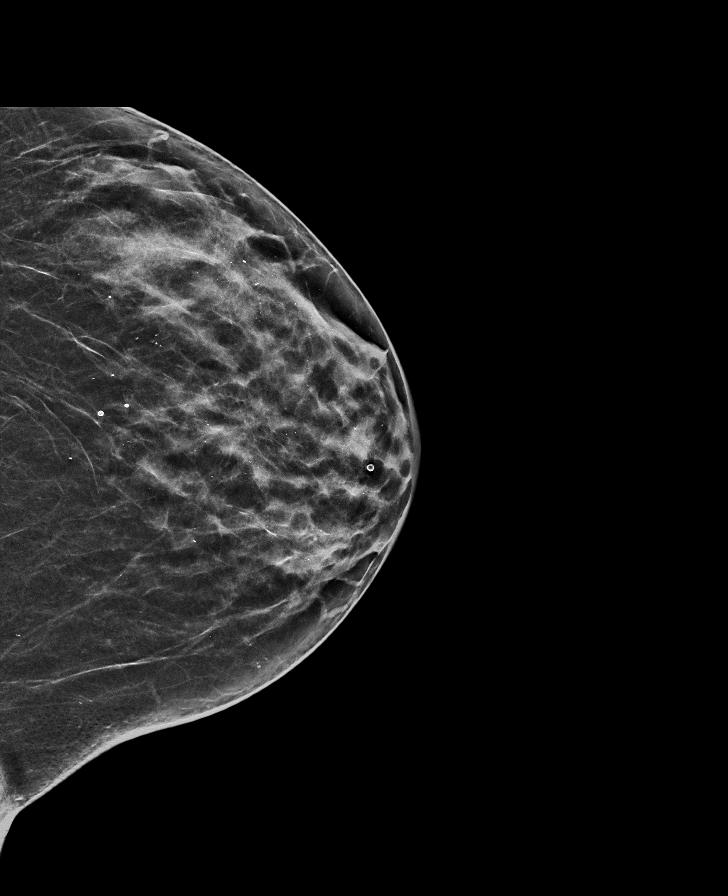

[R MLO tomo · tomo slice 36/71.0]
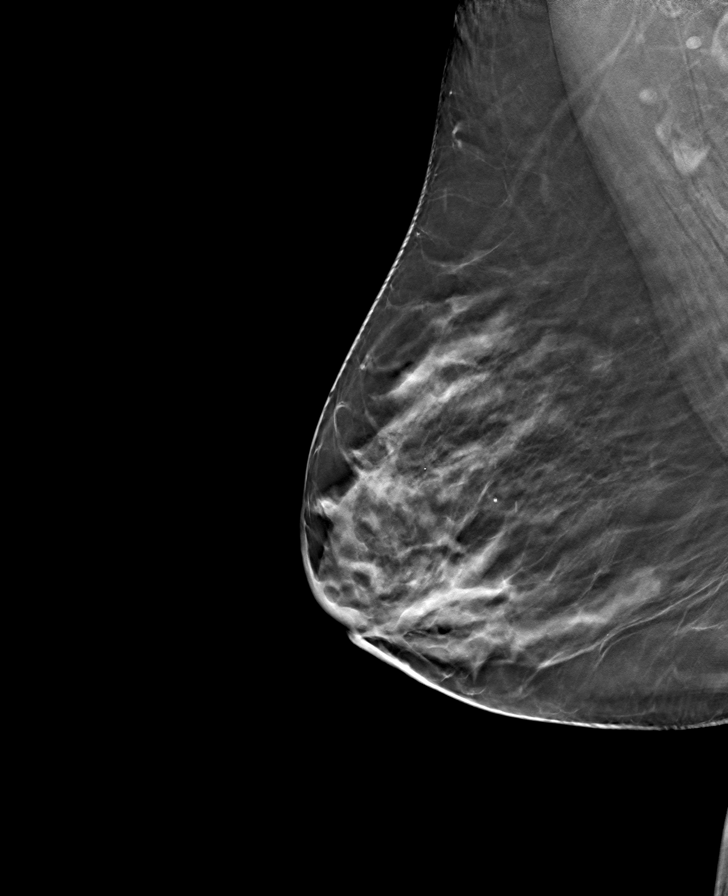

[L CC tomo · tomo slice 31/60.0]
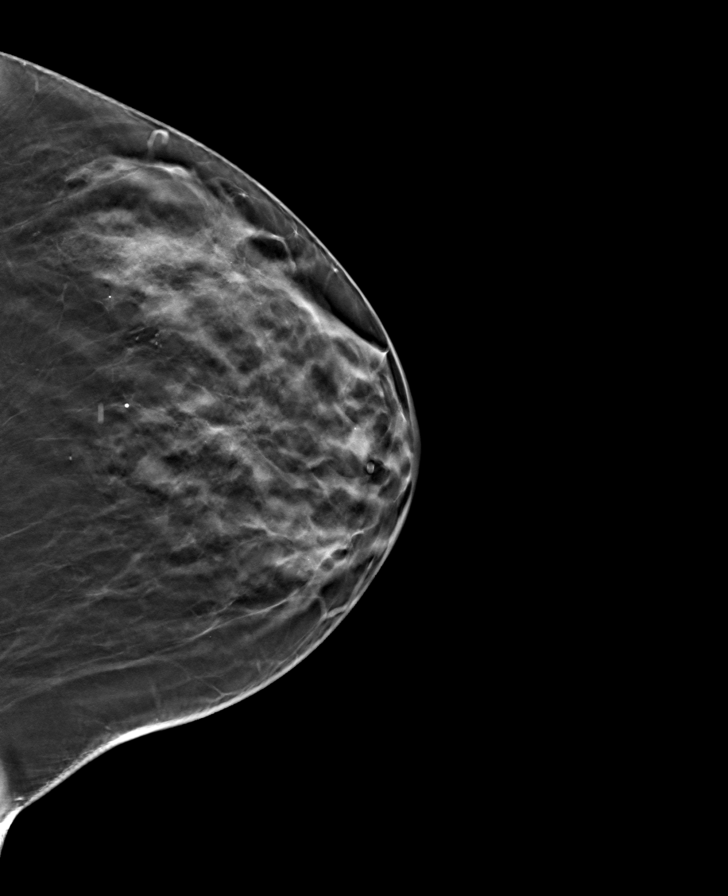

[R CC tomo · tomo slice 29/56.0]
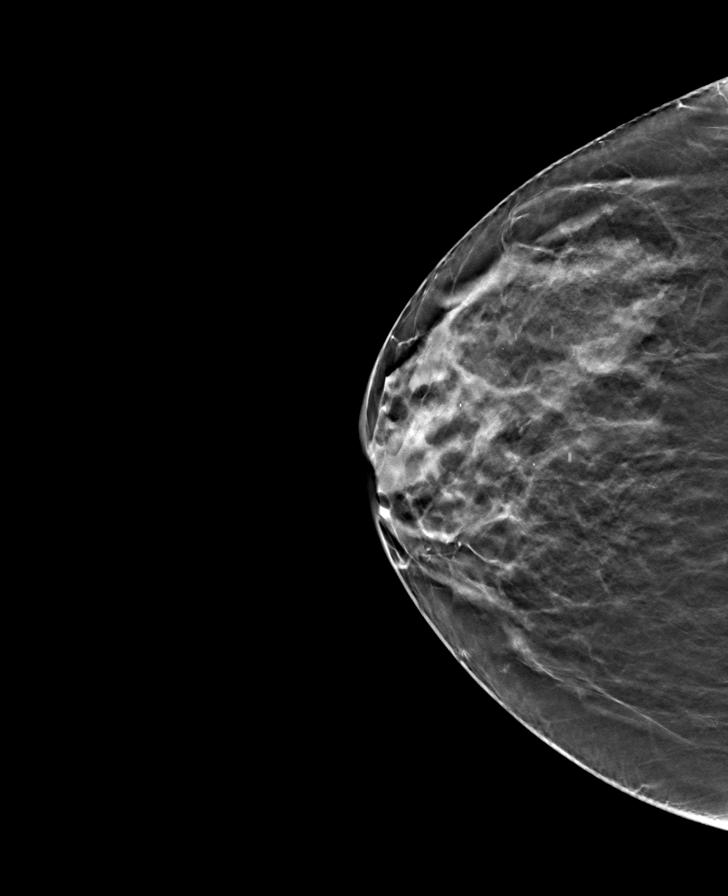

[L MLO tomo · tomo slice 35/69.0]
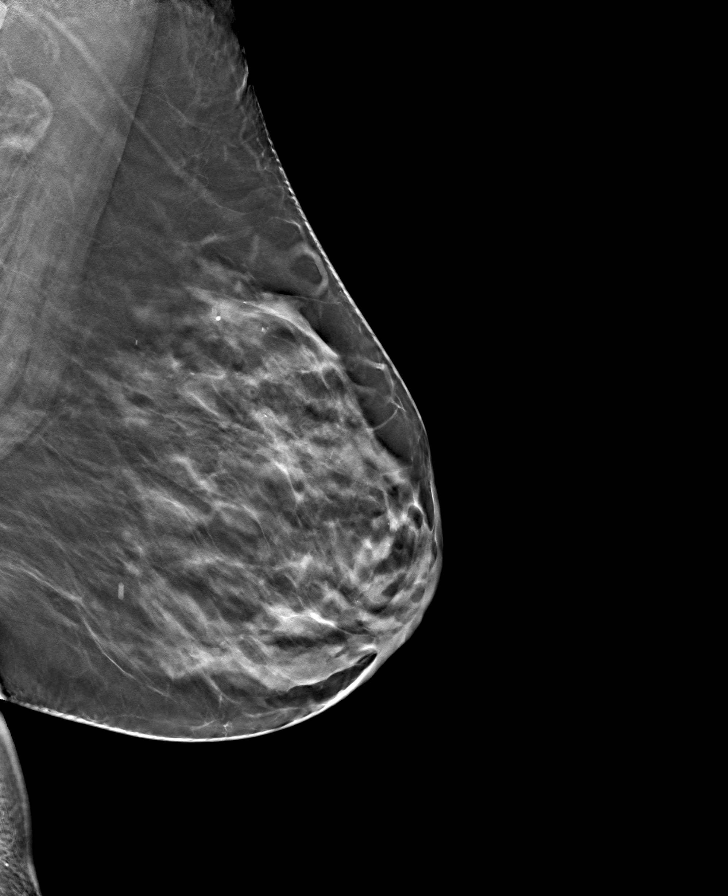

[8 of 24 positions shown; findings below may reference images not displayed]

ACR Breast Density Category c: The breast tissue is heterogeneously
dense, which may obscure small masses.
FINDINGS: There are no findings suspicious for malignancy. Images were
processed with CAD.
IMPRESSION: No mammographic evidence of malignancy. A result letter of this
screening mammogram will be mailed directly to the patient.

RECOMMENDATION:
Screening mammogram in one year. (Code:[id number])

BI-RADS CATEGORY  1: Negative.

## 2019-06-12 ENCOUNTER — Inpatient Hospital Stay: Admission: RE | Admit: 2019-06-12 | Payer: Medicare Other | Source: Ambulatory Visit

## 2019-06-12 DIAGNOSIS — R262 Difficulty in walking, not elsewhere classified: Secondary | ICD-10-CM | POA: Diagnosis not present

## 2019-06-12 DIAGNOSIS — M25561 Pain in right knee: Secondary | ICD-10-CM | POA: Diagnosis not present

## 2019-06-14 DIAGNOSIS — M25561 Pain in right knee: Secondary | ICD-10-CM | POA: Diagnosis not present

## 2019-06-14 DIAGNOSIS — R262 Difficulty in walking, not elsewhere classified: Secondary | ICD-10-CM | POA: Diagnosis not present

## 2019-06-19 DIAGNOSIS — M25561 Pain in right knee: Secondary | ICD-10-CM | POA: Diagnosis not present

## 2019-06-19 DIAGNOSIS — E785 Hyperlipidemia, unspecified: Secondary | ICD-10-CM | POA: Diagnosis not present

## 2019-06-19 DIAGNOSIS — I10 Essential (primary) hypertension: Secondary | ICD-10-CM | POA: Diagnosis not present

## 2019-06-19 DIAGNOSIS — I2510 Atherosclerotic heart disease of native coronary artery without angina pectoris: Secondary | ICD-10-CM | POA: Diagnosis not present

## 2019-06-19 DIAGNOSIS — E1159 Type 2 diabetes mellitus with other circulatory complications: Secondary | ICD-10-CM | POA: Diagnosis not present

## 2019-06-19 DIAGNOSIS — R262 Difficulty in walking, not elsewhere classified: Secondary | ICD-10-CM | POA: Diagnosis not present

## 2019-06-19 DIAGNOSIS — E1169 Type 2 diabetes mellitus with other specified complication: Secondary | ICD-10-CM | POA: Diagnosis not present

## 2019-06-21 DIAGNOSIS — R262 Difficulty in walking, not elsewhere classified: Secondary | ICD-10-CM | POA: Diagnosis not present

## 2019-06-21 DIAGNOSIS — M25561 Pain in right knee: Secondary | ICD-10-CM | POA: Diagnosis not present

## 2019-07-03 DIAGNOSIS — M25561 Pain in right knee: Secondary | ICD-10-CM | POA: Diagnosis not present

## 2019-07-03 DIAGNOSIS — R262 Difficulty in walking, not elsewhere classified: Secondary | ICD-10-CM | POA: Diagnosis not present

## 2019-07-05 DIAGNOSIS — M25561 Pain in right knee: Secondary | ICD-10-CM | POA: Diagnosis not present

## 2019-07-05 DIAGNOSIS — R262 Difficulty in walking, not elsewhere classified: Secondary | ICD-10-CM | POA: Diagnosis not present

## 2019-07-25 DIAGNOSIS — M25461 Effusion, right knee: Secondary | ICD-10-CM | POA: Diagnosis not present

## 2019-07-25 DIAGNOSIS — M1711 Unilateral primary osteoarthritis, right knee: Secondary | ICD-10-CM | POA: Diagnosis not present

## 2019-08-15 LAB — HM DIABETES EYE EXAM

## 2019-08-18 ENCOUNTER — Telehealth: Payer: Self-pay

## 2019-08-23 DIAGNOSIS — E119 Type 2 diabetes mellitus without complications: Secondary | ICD-10-CM | POA: Diagnosis not present

## 2019-08-23 DIAGNOSIS — H2513 Age-related nuclear cataract, bilateral: Secondary | ICD-10-CM | POA: Diagnosis not present

## 2019-08-23 DIAGNOSIS — H21233 Degeneration of iris (pigmentary), bilateral: Secondary | ICD-10-CM | POA: Diagnosis not present

## 2019-09-01 NOTE — Telephone Encounter (Signed)
error 

## 2019-10-02 ENCOUNTER — Other Ambulatory Visit: Payer: Self-pay

## 2019-10-02 ENCOUNTER — Ambulatory Visit (INDEPENDENT_AMBULATORY_CARE_PROVIDER_SITE_OTHER): Payer: Medicare Other

## 2019-10-02 DIAGNOSIS — Z23 Encounter for immunization: Secondary | ICD-10-CM | POA: Diagnosis not present

## 2019-11-03 DIAGNOSIS — E1169 Type 2 diabetes mellitus with other specified complication: Secondary | ICD-10-CM | POA: Diagnosis not present

## 2019-11-03 DIAGNOSIS — E1159 Type 2 diabetes mellitus with other circulatory complications: Secondary | ICD-10-CM | POA: Diagnosis not present

## 2019-11-03 DIAGNOSIS — E785 Hyperlipidemia, unspecified: Secondary | ICD-10-CM | POA: Diagnosis not present

## 2019-11-03 DIAGNOSIS — E1142 Type 2 diabetes mellitus with diabetic polyneuropathy: Secondary | ICD-10-CM | POA: Diagnosis not present

## 2019-11-03 DIAGNOSIS — I10 Essential (primary) hypertension: Secondary | ICD-10-CM | POA: Diagnosis not present

## 2019-11-14 LAB — HEMOGLOBIN A1C: Hemoglobin A1C: 6.4

## 2019-11-15 ENCOUNTER — Ambulatory Visit: Payer: Medicare Other | Admitting: Family Medicine

## 2019-11-20 ENCOUNTER — Other Ambulatory Visit: Payer: Self-pay | Admitting: Family Medicine

## 2019-11-20 ENCOUNTER — Ambulatory Visit (INDEPENDENT_AMBULATORY_CARE_PROVIDER_SITE_OTHER): Payer: Medicare Other | Admitting: Family Medicine

## 2019-11-20 ENCOUNTER — Ambulatory Visit: Payer: Medicare Other | Admitting: Family Medicine

## 2019-11-20 ENCOUNTER — Encounter: Payer: Self-pay | Admitting: Family Medicine

## 2019-11-20 ENCOUNTER — Other Ambulatory Visit: Payer: Self-pay

## 2019-11-20 VITALS — BP 130/60 | HR 68 | Ht 65.0 in | Wt 160.0 lb

## 2019-11-20 DIAGNOSIS — E785 Hyperlipidemia, unspecified: Secondary | ICD-10-CM

## 2019-11-20 DIAGNOSIS — R69 Illness, unspecified: Secondary | ICD-10-CM | POA: Diagnosis not present

## 2019-11-20 DIAGNOSIS — H1012 Acute atopic conjunctivitis, left eye: Secondary | ICD-10-CM | POA: Diagnosis not present

## 2019-11-20 DIAGNOSIS — I10 Essential (primary) hypertension: Secondary | ICD-10-CM | POA: Diagnosis not present

## 2019-11-20 DIAGNOSIS — J301 Allergic rhinitis due to pollen: Secondary | ICD-10-CM | POA: Diagnosis not present

## 2019-11-20 DIAGNOSIS — K219 Gastro-esophageal reflux disease without esophagitis: Secondary | ICD-10-CM | POA: Diagnosis not present

## 2019-11-20 DIAGNOSIS — G43109 Migraine with aura, not intractable, without status migrainosus: Secondary | ICD-10-CM | POA: Diagnosis not present

## 2019-11-20 MED ORDER — PANTOPRAZOLE SODIUM 40 MG PO TBEC: 40.0000 mg | DELAYED_RELEASE_TABLET | Freq: Every day | ORAL | 1 refills | Status: DC

## 2019-11-20 MED ORDER — ROSUVASTATIN CALCIUM 20 MG PO TABS: ORAL_TABLET | ORAL | 1 refills | Status: DC

## 2019-11-20 MED ORDER — HYDROCHLOROTHIAZIDE 25 MG PO TABS: 25.0000 mg | ORAL_TABLET | Freq: Every morning | ORAL | 1 refills | Status: DC

## 2019-11-20 MED ORDER — LISINOPRIL 10 MG PO TABS: 10.0000 mg | ORAL_TABLET | Freq: Every day | ORAL | 1 refills | Status: DC

## 2019-11-20 MED ORDER — FLUTICASONE PROPIONATE 50 MCG/ACT NA SUSP: NASAL | 1 refills | Status: DC

## 2019-11-20 MED ORDER — OLOPATADINE HCL 0.1 % OP SOLN: 1.0000 [drp] | Freq: Two times a day (BID) | OPHTHALMIC | 12 refills | Status: AC

## 2019-11-20 MED ORDER — MONTELUKAST SODIUM 10 MG PO TABS: 10.0000 mg | ORAL_TABLET | Freq: Every day | ORAL | 1 refills | Status: DC

## 2019-11-20 MED ORDER — AMLODIPINE BESYLATE 5 MG PO TABS: 5.0000 mg | ORAL_TABLET | Freq: Every day | ORAL | 1 refills | Status: DC

## 2019-11-20 MED ORDER — SUMATRIPTAN SUCCINATE 50 MG PO TABS: 50.0000 mg | ORAL_TABLET | ORAL | 1 refills | Status: DC | PRN

## 2019-11-20 NOTE — Progress Notes (Signed)
Date:  11/20/2019   Name:  Nicole Cook   DOB:  1951-12-05   MRN:  [id number]   Chief Complaint: Hypertension, Allergic Rhinitis , Hyperlipidemia, Gastroesophageal Reflux, and Migraine  Hypertension This is a chronic problem. The current episode started more than 1 year ago. The problem has been gradually improving since onset. The problem is controlled. Pertinent negatives include no anxiety, blurred vision, chest pain, headaches, malaise/fatigue, neck pain, orthopnea, palpitations, peripheral edema, PND, shortness of breath or sweats. There are no associated agents to hypertension. Risk factors for coronary artery disease include dyslipidemia and diabetes mellitus. Past treatments include beta blockers, calcium channel blockers, diuretics and ACE inhibitors. The current treatment provides moderate improvement. There are no compliance problems.  Hypertensive end-organ damage includes CAD/MI. There is no history of angina, kidney disease, CVA, heart failure, left ventricular hypertrophy, PVD or retinopathy. There is no history of chronic renal disease, a hypertension causing med or renovascular disease.  Hyperlipidemia This is a chronic problem. The current episode started more than 1 year ago. The problem is controlled. Recent lipid tests were reviewed and are normal. She has no history of chronic renal disease. Factors aggravating her hyperlipidemia include thiazides. Pertinent negatives include no chest pain, focal sensory loss, focal weakness, leg pain, myalgias or shortness of breath. Current antihyperlipidemic treatment includes statins. The current treatment provides moderate improvement of lipids. There are no compliance problems.  Risk factors for coronary artery disease include obesity.  Gastroesophageal Reflux She reports no abdominal pain, no belching, no chest pain, no choking, no coughing, no dysphagia, no early satiety, no globus sensation, no heartburn, no hoarse voice, no nausea,  no sore throat, no stridor, no tooth decay, no water brash or no wheezing. This is a chronic problem. The current episode started more than 1 year ago. The problem has been gradually improving. The symptoms are aggravated by certain foods. Pertinent negatives include no anemia, fatigue, melena, muscle weakness, orthopnea or weight loss. There are no known risk factors. She has tried a PPI for the symptoms. The treatment provided moderate relief.  Migraine  This is a chronic (last couple months ago) problem. The current episode started more than 1 year ago. The problem has been unchanged. Pertinent negatives include no abdominal pain, back pain, blurred vision, coughing, dizziness, ear pain, fever, nausea, neck pain, sore throat or weight loss. She has tried triptans for the symptoms. Her past medical history is significant for hypertension.    Lab Results  Component Value Date   CREATININE 0.65 05/24/2019   BUN 14 05/24/2019   NA 140 05/24/2019   K 4.6 05/24/2019   CL 97 05/24/2019   CO2 26 05/24/2019   Lab Results  Component Value Date   CHOL 128 05/24/2019   HDL 37 (L) 05/24/2019   LDLCALC 48 05/24/2019   TRIG 213 (H) 05/24/2019   CHOLHDL 3.3 03/09/2018   No results found for: TSH Lab Results  Component Value Date   HGBA1C 6.4 11/14/2019     Review of Systems  Constitutional: Negative for chills, fatigue, fever, malaise/fatigue and weight loss.  HENT: Negative for drooling, ear discharge, ear pain, hoarse voice, postnasal drip and sore throat.   Eyes: Negative for blurred vision.  Respiratory: Negative for cough, choking, shortness of breath and wheezing.   Cardiovascular: Negative for chest pain, palpitations, orthopnea, leg swelling and PND.  Gastrointestinal: Negative for abdominal pain, blood in stool, constipation, diarrhea, dysphagia, heartburn, melena and nausea.  Endocrine: Negative for polydipsia.  Genitourinary: Negative for dysuria, frequency, hematuria and urgency.   Musculoskeletal: Negative for back pain, myalgias, muscle weakness and neck pain.  Skin: Negative for rash.  Allergic/Immunologic: Negative for environmental allergies.  Neurological: Negative for dizziness, focal weakness and headaches.  Hematological: Does not bruise/bleed easily.  Psychiatric/Behavioral: Negative for suicidal ideas. The patient is not nervous/anxious.     Patient Active Problem List   Diagnosis Date Noted  . Atrophic vaginitis 11/23/2018  . Pelvic floor relaxation 11/23/2018  . Primary osteoarthritis of right knee 05/12/2018  . Sacral back pain 08/04/2017  . Taking medication for chronic disease 03/02/2017  . Seasonal allergic rhinitis due to pollen 03/02/2017  . Gastroesophageal reflux disease 03/02/2017  . Migraine with aura and without status migrainosus, not intractable 03/02/2017  . Angina pectoris ([place]) 11/09/2016  . Chronic right-sided low back pain without sciatica 10/29/2016  . Trochanteric bursitis of right hip 07/31/2015  . Primary osteoarthritis of right hip 07/31/2015  . Type 2 diabetes mellitus with hyperglycemia, with long-term current use of insulin ([place]) 04/03/2015  . Hyperlipidemia 04/03/2015  . Laboratory animal allergy 04/03/2015  . Cephalalgia 04/03/2015  . Essential hypertension 04/03/2015  . Blood glucose elevated 04/03/2015  . Routine general medical examination at a health care facility 04/03/2015  . CAD (coronary artery disease) 04/03/2015  . Dermatochalasis of both upper eyelids 09/20/2014  . Sleep apnea 09/03/2014  . DDD (degenerative disc disease), lumbosacral 03/03/2012  . Spinal stenosis of lumbar region without neurogenic claudication 03/03/2012  . Thoracic or lumbosacral neuritis or radiculitis, unspecified 03/03/2012    Allergies  Allergen Reactions  . Aspartame Other (See Comments)  . Compazine [Prochlorperazine]   . Penicillins   . Sulfa Antibiotics   . Sulfamethoxazole-Trimethoprim Hives and Other (See Comments)     Other Reaction: Not Assessed     Past Surgical History:  Procedure Laterality Date  . back surgery x 2    . c-section x 3    . COLONOSCOPY  2011   cleared for 5 yrs- Dr Beckey Downing  . KNEE ARTHROSCOPY Right 2010  . VAGINAL HYSTERECTOMY  12/14/1994    Social History   Tobacco Use  . Smoking status: Never Smoker  . Smokeless tobacco: Never Used  Substance Use Topics  . Alcohol use: No    Alcohol/week: 0.0 standard drinks  . Drug use: No     Medication list has been reviewed and updated.  Current Meds  Medication Sig  . ACCU-CHEK FASTCLIX LANCETS MISC Accu-Chek [employer]  . amLODipine (NORVASC) 5 MG tablet Take 1 tablet (5 mg total) by mouth daily.  Marland Kitchen aspirin 81 MG tablet Take 1 tablet by mouth daily.  . cetirizine (ZYRTEC) 10 MG tablet Take 10 mg by mouth daily. otc  . Cholecalciferol (VITAMIN D3) 1000 units CAPS Take 1 capsule by mouth daily.  . Coenzyme Q10 (EQL COQ10) 300 MG CAPS Take 1 capsule by mouth daily.  . Dulaglutide (TRULICITY) 1.19 JY/7.8GN SOPN Inject 1 each into the skin once a week. Dr Eddie Dibbles   . estradiol (ESTRING) 2 MG vaginal ring Place 2 mg vaginally every 3 (three) months. follow package directions- GYN  . fluticasone (FLONASE) 50 MCG/ACT nasal spray One spray each nostril daily (Patient taking differently: Place 1 spray into both nostrils daily. One spray each nostril daily)  . gabapentin (NEURONTIN) 300 MG capsule Take 2 capsules by mouth 3 (three) times daily. [facility]- may take 3 capsules 3 times per day per pt  . hydrochlorothiazide (HYDRODIURIL) 25 MG  tablet Take 1 tablet (25 mg total) by mouth every morning.  Marland Kitchen HYDROcodone-acetaminophen (NORCO) 10-325 MG per tablet Take 1 tablet by mouth as needed. [facility]  . insulin lispro (HUMALOG KWIKPEN) 100 UNIT/ML KwikPen Humalog KwikPen (U-100) Insulin 100 unit/mL subcutaneous  INJECT 2-8 UNITS BEFORE MEALS AS DIRECTED  . Lido-Capsaicin-Men-Methyl Sal 0.5-0.035-5-20 % PTCH Apply 1 application  topically as needed. [facility]  . lidocaine (LIDODERM) 5 % lidocaine 5 % topical patch  APPLY 1 PATCH DAILY (12 HOURS ON AND 12 HOURS OFF)  . lisinopril (ZESTRIL) 10 MG tablet Take 1 tablet (10 mg total) by mouth daily.  . Magnesium 500 MG CAPS Take 1 capsule by mouth daily.  . metFORMIN (GLUCOPHAGE) 1000 MG tablet 1 tablet 2 (two) times daily.  . metoprolol succinate (TOPROL-XL) 25 MG 24 hr tablet Take 1 tablet (25 mg total) by mouth daily. Cardio- takes with 1m= 75  . metoprolol succinate (TOPROL-XL) 50 MG 24 hr tablet TAKE 1 TABLET DAILY WITH OR IMMEDIATELY FOLLOWING A MEAL  . montelukast (SINGULAIR) 10 MG tablet Take 1 tablet (10 mg total) by mouth daily.  . Multiple Vitamins-Minerals (CENTRUM SILVER ULTRA WOMENS PO) Take 1 tablet by mouth daily.  . nitroGLYCERIN (NITROSTAT) 0.4 MG SL tablet Place 1 tablet under the tongue as needed.  . ONE TOUCH ULTRA TEST test strip   . OXYCONTIN 10 MG 12 hr tablet Take 10 mg by mouth 2 (two) times daily. Pain Dr  . pantoprazole (PROTONIX) 40 MG tablet Take 1 tablet (40 mg total) by mouth daily.  . rosuvastatin (CRESTOR) 20 MG tablet TAKE 1 TABLET DAILY  . SUMAtriptan (IMITREX) 50 MG tablet Take 1 tablet (50 mg total) by mouth as needed. Per 90 days  . tiZANidine (ZANAFLEX) 4 MG capsule Take 1 capsule by mouth as needed. [facility]  . vitamin B-12 (CYANOCOBALAMIN) 1000 MCG tablet Take 1,000 mcg by mouth daily.    PHQ 2/9 Scores 11/20/2019 05/24/2019 11/23/2018 10/10/2018  PHQ - 2 Score 0 0 0 0  PHQ- 9 Score 0 0 1 0    BP Readings from Last 3 Encounters:  11/20/19 130/60  05/24/19 118/64  11/23/18 118/72    Physical Exam Vitals signs and nursing note reviewed.  Constitutional:      Appearance: She is well-developed.  HENT:     Head: Normocephalic.     Right Ear: Tympanic membrane, ear canal and external ear normal.     Left Ear: Tympanic membrane, ear canal and external ear normal.     Nose: Nose normal.     Mouth/Throat:     Mouth: Mucous  membranes are moist.  Eyes:     General: Lids are everted, no foreign bodies appreciated. No scleral icterus.       Left eye: No foreign body or hordeolum.     Conjunctiva/sclera: Conjunctivae normal.     Right eye: Right conjunctiva is not injected.     Left eye: Left conjunctiva is not injected.     Pupils: Pupils are equal, round, and reactive to light.  Neck:     Musculoskeletal: Normal range of motion and neck supple. No neck rigidity or muscular tenderness.     Thyroid: No thyromegaly.     Vascular: No carotid bruit or JVD.     Trachea: No tracheal deviation.  Cardiovascular:     Rate and Rhythm: Normal rate and regular rhythm.     Chest Wall: PMI is not displaced.     Pulses: Normal  pulses.     Heart sounds: Normal heart sounds, S1 normal and S2 normal. No murmur. No systolic murmur. No diastolic murmur. No friction rub. No gallop. No S3 or S4 sounds.   Pulmonary:     Effort: Pulmonary effort is normal. No respiratory distress.     Breath sounds: Normal breath sounds. No wheezing or rales.  Abdominal:     General: Bowel sounds are normal.     Palpations: Abdomen is soft. There is no mass.     Tenderness: There is no abdominal tenderness. There is no guarding or rebound.  Musculoskeletal: Normal range of motion.        General: No tenderness.     Right lower leg: No edema.     Left lower leg: No edema.  Lymphadenopathy:     Cervical: No cervical adenopathy.  Skin:    General: Skin is warm.     Findings: No rash.  Neurological:     Mental Status: She is alert and oriented to person, place, and time.     Cranial Nerves: No cranial nerve deficit.     Deep Tendon Reflexes: Reflexes normal.  Psychiatric:        Mood and Affect: Mood is not anxious or depressed.     Wt Readings from Last 3 Encounters:  11/20/19 160 lb (72.6 kg)  05/24/19 166 lb (75.3 kg)  03/24/19 168 lb (76.2 kg)    BP 130/60   Pulse 68   Ht 5' 5" (1.651 m)   Wt 160 lb (72.6 kg)   BMI 26.63 kg/m    Assessment and Plan: 1. Essential hypertension Chronic.  Controlled.  Stable.  Will continue amlodipine 5 mg, hydrochlorothiazide 25 mg, lisinopril 10 mg once a day and metoprolol XL as prescribed by her cardiologist 50 to 75 mg depending on her circumstances.  Will check renal function panel. - amLODipine (NORVASC) 5 MG tablet; Take 1 tablet (5 mg total) by mouth daily.  Dispense: 90 tablet; Refill: 1 - hydrochlorothiazide (HYDRODIURIL) 25 MG tablet; Take 1 tablet (25 mg total) by mouth every morning.  Dispense: 90 tablet; Refill: 1 - lisinopril (ZESTRIL) 10 MG tablet; Take 1 tablet (10 mg total) by mouth daily.  Dispense: 90 tablet; Refill: 1 - Renal Function Panel  2. Seasonal allergic rhinitis due to pollen Chronic.  Controlled.  Patient had some irritation of her left eye for which it was suggested that she continue her Singulair and Flonase.  Patient also has admitted that she has been told that she has perhaps dry eyes and that this is been exacerbated by the wearing of a mask.  This may be due to a airflow over the eye cornea and patient may need to be on a more substantial LiquiTears regimen. - fluticasone (FLONASE) 50 MCG/ACT nasal spray; One spray each nostril daily  Dispense: 48 g; Refill: 1 - montelukast (SINGULAIR) 10 MG tablet; Take 1 tablet (10 mg total) by mouth daily.  Dispense: 90 tablet; Refill: 1  3. Gastroesophageal reflux disease Chronic.  Controlled.  Stable.  Patient will continue pantoprazole 40 mg once a day. - pantoprazole (PROTONIX) 40 MG tablet; Take 1 tablet (40 mg total) by mouth daily.  Dispense: 90 tablet; Refill: 1  4. Hyperlipidemia, unspecified hyperlipidemia type Chronic.  Controlled.  Stable.  Will check lipid panel.  We will also check a hepatic function panel which is been normal in the past to rule out hepatotoxicity secondary to statin use.  We will check a lipid  panel to assess today's lipid status. - rosuvastatin (CRESTOR) 20 MG tablet; TAKE 1  TABLET DAILY  Dispense: 90 tablet; Refill: 1 - Lipid Panel With LDL/HDL Ratio  5. Migraine with aura and without status migrainosus, not intractable Controlled.  Stable.  Chronic.  Patient has had a history of migraines in the past but they are currently under control with Imitrex 50 mg as needed headache.  Patient has not noticed any further concerns. - SUMAtriptan (IMITREX) 50 MG tablet; Take 1 tablet (50 mg total) by mouth as needed. Per 90 days  Dispense: 10 tablet; Refill: 1  6. Allergic conjunctivitis of left eye Patient may have an allergic conjunctivitis to the left eye for which she was given a suggestion of Patanol or Pataday that is an over-the-counter preparation for the eye.  7. Taking medication for chronic disease Patient will have her hepatic function checked because of her statin use.  On review of her LFTs it was noted that only her alk phos which was decreased was minimally abnormal. - Hepatic Function Panel (6)

## 2019-11-21 LAB — RENAL FUNCTION PANEL
Albumin: 4.5 g/dL (ref 3.8–4.8)
BUN/Creatinine Ratio: 21 (ref 12–28)
BUN: 15 mg/dL (ref 8–27)
CO2: 27 mmol/L (ref 20–29)
Calcium: 10.1 mg/dL (ref 8.7–10.3)
Chloride: 98 mmol/L (ref 96–106)
Creatinine, Ser: 0.7 mg/dL (ref 0.57–1.00)
GFR calc Af Amer: 102 mL/min/{1.73_m2} (ref >59)
GFR calc non Af Amer: 88 mL/min/{1.73_m2} (ref >59)
Glucose: 145 mg/dL — ABNORMAL HIGH (ref 65–99)
Phosphorus: 3.9 mg/dL (ref 3.0–4.3)
Potassium: 4.4 mmol/L (ref 3.5–5.2)
Sodium: 140 mmol/L (ref 134–144)

## 2019-11-21 LAB — LIPID PANEL WITH LDL/HDL RATIO
Cholesterol, Total: 133 mg/dL (ref 100–199)
HDL: 38 mg/dL — ABNORMAL LOW (ref >39)
LDL Chol Calc (NIH): 67 mg/dL (ref 0–99)
LDL/HDL Ratio: 1.8 ratio (ref 0.0–3.2)
Triglycerides: 166 mg/dL — ABNORMAL HIGH (ref 0–149)
VLDL Cholesterol Cal: 28 mg/dL (ref 5–40)

## 2019-11-21 LAB — HEPATIC FUNCTION PANEL (6)
ALT: 17 IU/L (ref 0–32)
AST: 14 IU/L (ref 0–40)
Alkaline Phosphatase: 46 IU/L (ref 39–117)
Bilirubin Total: 0.3 mg/dL (ref 0.0–1.2)
Bilirubin, Direct: 0.1 mg/dL (ref 0.00–0.40)

## 2019-11-29 ENCOUNTER — Ambulatory Visit: Payer: Medicare Other

## 2019-11-29 ENCOUNTER — Other Ambulatory Visit: Payer: Self-pay

## 2019-11-29 NOTE — Progress Notes (Unsigned)
Fall eval

## 2019-12-17 DIAGNOSIS — U071 COVID-19: Secondary | ICD-10-CM | POA: Diagnosis not present

## 2019-12-18 ENCOUNTER — Telehealth: Payer: Self-pay

## 2019-12-18 ENCOUNTER — Other Ambulatory Visit: Payer: Self-pay

## 2019-12-18 DIAGNOSIS — R112 Nausea with vomiting, unspecified: Secondary | ICD-10-CM

## 2019-12-18 MED ORDER — ONDANSETRON HCL 4 MG PO TABS: 4.0000 mg | ORAL_TABLET | Freq: Three times a day (TID) | ORAL | 0 refills | Status: DC | PRN

## 2019-12-18 NOTE — Telephone Encounter (Signed)
Pt called in stating she is "covid positive" and is having nausea and vomitting. We sent in some Zofran for nausea to [facility]

## 2019-12-18 NOTE — Progress Notes (Unsigned)
Sent in Zofran for nausea to [facility]

## 2019-12-25 ENCOUNTER — Ambulatory Visit (INDEPENDENT_AMBULATORY_CARE_PROVIDER_SITE_OTHER): Payer: Medicare Other

## 2019-12-25 VITALS — Temp 98.3°F | Ht 65.0 in | Wt 160.0 lb

## 2019-12-25 DIAGNOSIS — Z0000 Encounter for general adult medical examination without abnormal findings: Secondary | ICD-10-CM | POA: Diagnosis not present

## 2019-12-25 DIAGNOSIS — E1169 Type 2 diabetes mellitus with other specified complication: Secondary | ICD-10-CM | POA: Insufficient documentation

## 2019-12-25 DIAGNOSIS — Z1231 Encounter for screening mammogram for malignant neoplasm of breast: Secondary | ICD-10-CM

## 2019-12-25 DIAGNOSIS — Z780 Asymptomatic menopausal state: Secondary | ICD-10-CM | POA: Diagnosis not present

## 2019-12-25 DIAGNOSIS — E785 Hyperlipidemia, unspecified: Secondary | ICD-10-CM | POA: Insufficient documentation

## 2019-12-25 NOTE — Progress Notes (Signed)
Subjective:   Nicole Cook is a 69 y.o. female who presents for Medicare Annual (Subsequent) preventive examination.  Virtual Visit via Telephone Note  I connected with Nicole Cook on 12/25/19 at 10:40 AM EST by telephone and verified that I am speaking with the correct person using two identifiers.  Medicare Annual Wellness visit completed telephonically due to Covid-19 pandemic.   Location: Patient: home Provider: office   I discussed the limitations, risks, security and privacy concerns of performing an evaluation and management service by telephone and the availability of in person appointments. The patient expressed understanding and agreed to proceed.  Some vital signs may be absent or patient reported.   Clemetine Marker, LPN    Review of Systems:   Cardiac Risk Factors include: advanced age (>36mn, >>56women);hypertension;dyslipidemia;diabetes mellitus     Objective:     Vitals: Temp 98.3 F (36.8 C)    Ht '5\' 5"'$  (1.651 m)    Wt 160 lb (72.6 kg)    BMI 26.63 kg/m   Body mass index is 26.63 kg/m.  Advanced Directives 12/25/2019 11/23/2018 11/15/2017 10/07/2015 09/02/2015  Does Patient Have a Medical Advance Directive? No No No No No  Would patient like information on creating a medical advance directive? Yes (MAU/Ambulatory/Procedural Areas - Information given) Yes (MAU/Ambulatory/Procedural Areas - Information given) Yes (MAU/Ambulatory/Procedural Areas - Information given) No - patient declined information No - patient declined information    Tobacco Social History   Tobacco Use  Smoking Status Never Smoker  Smokeless Tobacco Never Used     Counseling given: Not Answered   Clinical Intake:  Pre-visit preparation completed: Yes  Pain : 0-10 Pain Score: 4  Pain Type: Chronic pain Pain Location: (chronic joint pain) Pain Descriptors / Indicators: Aching Pain Onset: More than a month ago Pain Frequency: Constant     BMI - recorded:  26.63 Nutritional Status: BMI 25 -29 Overweight Nutritional Risks: Nausea/ vomitting/ diarrhea Diabetes: Yes CBG done?: No Did pt. bring in CBG monitor from home?: No   Nutrition Risk Assessment:  Has the patient had any N/V/D within the last 2 months?  Yes  Does the patient have any non-healing wounds?  No  Has the patient had any unintentional weight loss or weight gain?  No   Diabetes:  Is the patient diabetic?  Yes  If diabetic, was a CBG obtained today?  No  Did the patient bring in their glucometer from home?  No  How often do you monitor your CBG's? daily.   Financial Strains and Diabetes Management:  Are you having any financial strains with the device, your supplies or your medication? No .  Does the patient want to be seen by Chronic Care Management for management of their diabetes?  No  Would the patient like to be referred to a Nutritionist or for Diabetic Management?  No   Diabetic Exams:  [facility]   Diabetic Foot Exam: Completed 11/22/18. Pt has been advised about the importance in completing this exam.   How often do you need to have someone help you when you read instructions, pamphlets, or other written materials from your doctor or pharmacy?: 1 - Never  Interpreter Needed?: No  Information entered by :: KClemetine MarkerLPN  Past Medical History:  Diagnosis Date   Allergy    Cancer ([place]    basal cell skin ca   Diabetes mellitus without complication ([place]    GERD (gastroesophageal reflux disease)  Hyperlipidemia    Hypertension    Migraine    Past Surgical History:  Procedure Laterality Date   back surgery x 2     c-section x 3     COLONOSCOPY  2011   cleared for 5 yrs- Dr Beckey Downing   KNEE ARTHROSCOPY Right 2010   VAGINAL HYSTERECTOMY  12/14/1994   Family History  Problem Relation Age of Onset   Breast cancer Mother 32   Breast cancer Sister 78   Breast cancer Paternal Aunt     Parkinson's disease Father    Social History   Socioeconomic History   Marital status: Married    Spouse name: Not on file   Number of children: 3   Years of education: Not on file   Highest education level: Bachelor's degree (e.g., BA, AB, BS)  Occupational History   Occupation: Retired  Tobacco Use   Smoking status: Never Smoker   Smokeless tobacco: Never Used  Substance and Sexual Activity   Alcohol use: No    Alcohol/week: 0.0 standard drinks   Drug use: No   Sexual activity: Yes  Other Topics Concern   Not on file  Social History Narrative   Not on file   Social Determinants of Health   Financial Resource Strain: Low Risk    Difficulty of Paying Living Expenses: Not hard at all  Food Insecurity: No Food Insecurity   Worried About Charity fundraiser in the Last Year: Never true   [place] in the Last Year: Never true  Transportation Needs: No Transportation Needs   Lack of Transportation (Medical): No   Lack of Transportation (Non-Medical): No  Physical Activity: Inactive   Days of Exercise per Week: 0 days   Minutes of Exercise per Session: 0 min  Stress: No Stress Concern Present   Feeling of Stress : Not at all  Social Connections: Slightly Isolated   Frequency of Communication with Friends and Family: More than three times a week   Frequency of Social Gatherings with Friends and Family: Three times a week   Attends Religious Services: More than 4 times per year   Active Member of Clubs or Organizations: No   Attends Archivist Meetings: Never   Marital Status: Married    Outpatient Encounter Medications as of 12/25/2019  Medication Sig   ACCU-CHEK FASTCLIX LANCETS MISC Accu-Chek Fastclix Lancet Drum   amLODipine (NORVASC) 5 MG tablet Take 1 tablet (5 mg total) by mouth daily.   aspirin 81 MG tablet Take 1 tablet by mouth daily.   cetirizine (ZYRTEC) 10 MG tablet Take 10 mg by mouth daily. otc    Cholecalciferol (VITAMIN D3) 1000 units CAPS Take 1 capsule by mouth daily.   Coenzyme Q10 (EQL COQ10) 300 MG CAPS Take 1 capsule by mouth daily.   Dulaglutide (TRULICITY) 6.30 ZS/0.1UX SOPN Inject 1 each into the skin once a week. Dr Eddie Dibbles    estradiol [facility]) 2 MG vaginal ring Place 2 mg vaginally every 3 (three) months. follow package directions- GYN   fluticasone (FLONASE) 50 MCG/ACT nasal spray One spray each nostril daily   gabapentin (NEURONTIN) 300 MG capsule Take 2 capsules by mouth 3 (three) times daily. [facility]- may take 3 capsules 3 times per day per pt   hydrochlorothiazide (HYDRODIURIL) 25 MG tablet Take 1 tablet (25 mg total) by mouth every morning.   HYDROcodone-acetaminophen (NORCO) 10-325 MG per tablet Take 1 tablet by mouth as needed. [facility]   Lido-Capsaicin-Men-Methyl  Sal 0.5-0.035-5-20 % PTCH Apply 1 application topically as needed. [facility]   lidocaine (LIDODERM) 5 % lidocaine 5 % topical patch  APPLY 1 PATCH DAILY (12 HOURS ON AND 12 HOURS OFF)   lisinopril (ZESTRIL) 10 MG tablet Take 1 tablet (10 mg total) by mouth daily.   Magnesium 500 MG CAPS Take 1 capsule by mouth daily.   metFORMIN (GLUCOPHAGE) 1000 MG tablet 1 tablet 2 (two) times daily.   metoprolol succinate (TOPROL-XL) 25 MG 24 hr tablet Take 1 tablet (25 mg total) by mouth daily. Cardio- takes with '50mg'$ = 75   metoprolol succinate (TOPROL-XL) 50 MG 24 hr tablet TAKE 1 TABLET DAILY WITH OR IMMEDIATELY FOLLOWING A MEAL   montelukast (SINGULAIR) 10 MG tablet Take 1 tablet (10 mg total) by mouth daily.   Multiple Vitamins-Minerals (CENTRUM SILVER ULTRA WOMENS PO) Take 1 tablet by mouth daily.   nitroGLYCERIN (NITROSTAT) 0.4 MG SL tablet Place 1 tablet under the tongue as needed.   olopatadine (PATANOL) 0.1 % ophthalmic solution Place 1 drop into both eyes 2 (two) times daily.   ondansetron (ZOFRAN) 4 MG tablet Take 1 tablet (4 mg total) by mouth every 8 (eight) hours as needed for nausea or  vomiting.   ONE TOUCH ULTRA TEST test strip    OXYCONTIN 10 MG 12 hr tablet Take 10 mg by mouth 2 (two) times daily. Pain Dr   pantoprazole (PROTONIX) 40 MG tablet Take 1 tablet (40 mg total) by mouth daily.   rosuvastatin (CRESTOR) 20 MG tablet TAKE 1 TABLET DAILY   SUMAtriptan (IMITREX) 50 MG tablet Take 1 tablet (50 mg total) by mouth as needed. Per 90 days   tiZANidine (ZANAFLEX) 4 MG tablet tizanidine 4 mg tablet  TAKE 1-2 TABLETS BY MOUTH AT NIGHT AT BEDTIME   vitamin B-12 (CYANOCOBALAMIN) 1000 MCG tablet Take 1,000 mcg by mouth daily.   insulin lispro (HUMALOG KWIKPEN) 100 UNIT/ML KwikPen Humalog KwikPen (U-100) Insulin 100 unit/mL subcutaneous  INJECT 2-8 UNITS BEFORE MEALS AS DIRECTED   [DISCONTINUED] tiZANidine (ZANAFLEX) 4 MG capsule Take 1 capsule by mouth as needed. [facility]   No facility-administered encounter medications on file as of 12/25/2019.    Activities of Daily Living In your present state of health, do you have any difficulty performing the following activities: 12/25/2019  Hearing? N  Comment declines hearing aids  Vision? N  Difficulty concentrating or making decisions? N  Walking or climbing stairs? N  Dressing or bathing? N  Doing errands, shopping? N  Preparing Food and eating ? N  Using the Toilet? N  In the past six months, have you accidently leaked urine? Y  Comment wears liners for protection  Do you have problems with loss of bowel control? N  Managing your Medications? N  Managing your Finances? N  Housekeeping or managing your Housekeeping? N  Some recent data might be hidden    Patient Care Team: Juline Patch, MD as PCP - General (Family Medicine) Vergia Alcon, MD as Consulting Physician Lindalou Hose Melvyn Novas, MD as Consulting Physician (Occupational Medicine) Enos Fling, MD as Consulting Physician (Obstetrics and Gynecology) Rolena Infante, MD as Consulting Physician (Physical Medicine and Rehabilitation) Elease Etienne, MD as  Consulting Physician (Internal Medicine)    Assessment:   This is a routine wellness examination for [place].  Exercise Activities and Dietary recommendations Current Exercise Habits: The patient does not participate in regular exercise at present, Exercise limited by: orthopedic condition(s)  Goals  DIET - INCREASE WATER INTAKE     Recommend to drink at least 6-8 8oz glasses of water per day        Fall Risk Fall Risk  12/25/2019 11/29/2019 11/23/2018 11/03/2018 10/10/2018  Falls in the past year? 1 0 0 0 No  Comment - - - Emmi Telephone Survey: data to providers prior to load -  Number falls in past yr: 1 - 0 - -  Comment mechanical fall - - - -  Injury with Fall? 0 - - - -  Risk for fall due to : No Fall Risks - - - -  Risk for fall due to: Comment - - - - -  Follow up Falls prevention discussed Falls evaluation completed - - -   FALL RISK PREVENTION PERTAINING TO THE HOME:  Any stairs in or around the home? Yes  If so, do they handrails? Yes   Home free of loose throw rugs in walkways, pet beds, electrical cords, etc? Yes  Adequate lighting in your home to reduce risk of falls? Yes   ASSISTIVE DEVICES UTILIZED TO PREVENT FALLS:  Life alert? No  Use of a cane, walker or w/c? No  Grab bars in the bathroom? Yes  Shower chair or bench in shower? Yes  Elevated toilet seat or a handicapped toilet? No   DME ORDERS:  DME order needed?  No   TIMED UP AND GO:  Was the test performed? No . Telephonic visit.   Education: Fall risk prevention has been discussed.  Intervention(s) required? No    Depression Screen PHQ 2/9 Scores 12/25/2019 11/20/2019 05/24/2019 11/23/2018  PHQ - 2 Score 0 0 0 0  PHQ- 9 Score - 0 0 1     Cognitive Function     6CIT Screen 12/25/2019 11/15/2017  What Year? 0 points 0 points  What month? 0 points 0 points  What time? 0 points 0 points  Count back from 20 0 points 0 points  Months in reverse 0 points 0 points  Repeat phrase 0  points 4 points  Total Score 0 4    Immunization History  Administered Date(s) Administered   Fluad Quad(high Dose 65+) 10/02/2019   Hepatitis B 12/14/1998   Influenza, High Dose Seasonal PF 09/06/2017, 09/26/2018   Influenza,inj,Quad PF,6+ Mos 09/02/2015, 09/02/2016   Influenza-Unspecified 09/13/2012, 08/14/2013, 01/10/2015, 02/10/2015, 08/15/2015, 09/02/2016, 08/14/2017   MMR 05/14/1981   Pneumococcal Polysaccharide-23 08/29/2014   Pneumococcal-Unspecified 05/14/2009, 08/14/2014   Td 12/14/2005   Tdap 01/14/2007, 02/12/2016, 03/02/2017   Zoster 01/14/2015, 02/10/2015    Qualifies for Shingles Vaccine? Yes  Zostavax completed 2016. Due for Shingrix. Education has been provided regarding the importance of this vaccine. Pt has been advised to call insurance company to determine out of pocket expense. Advised may also receive vaccine at local pharmacy or Health Dept. Verbalized acceptance and understanding.  Tdap: Up to date  Flu Vaccine: Up to date  Pneumococcal Vaccine: Up to date   Screening Tests Health Maintenance  Topic Date Due   MAMMOGRAM  04/13/2019   FOOT EXAM  11/23/2019   HEMOGLOBIN A1C  05/14/2020   OPHTHALMOLOGY EXAM  05/14/2020   TETANUS/TDAP  03/03/2027   COLONOSCOPY  01/25/2028   INFLUENZA VACCINE  Completed   DEXA SCAN  Completed   Hepatitis C Screening  Completed   PNA vac Low Risk Adult  Completed    Cancer Screenings:  Colorectal Screening: Completed 01/24/18. Repeat every 10 years;   Mammogram: Completed 04/12/18. Repeat every year.  Ordered today. Pt provided with contact information and advised to call to schedule appt.   Bone Density: Completed 03/10/17. Results reflect NORMAL. Repeat every 2 years. Ordered today. Pt provided with contact information and advised to call to schedule appt.   Lung Cancer Screening: (Low Dose CT Chest recommended if Age 4-80 years, 30 pack-year currently smoking OR have quit w/in 15years.) does  not qualify.   Additional Screening:  Hepatitis C Screening: does qualify; Completed 03/02/16  Vision Screening: Recommended annual ophthalmology exams for early detection of glaucoma and other disorders of the eye. Is the patient up to date with their annual eye exam?  Yes  Who is the provider or what is the name of the office in which the pt attends annual eye exams? [place]  Dental Screening: Recommended annual dental exams for proper oral hygiene  Community Resource Referral:  CRR required this visit?  No      Plan:    I have personally reviewed and addressed the Medicare Annual Wellness questionnaire and have noted the following in the patients chart:  A. Medical and social history B. Use of alcohol, tobacco or illicit drugs  C. Current medications and supplements D. Functional ability and status E.  Nutritional status F.  Physical activity G. Advance directives H. List of other physicians I.  Hospitalizations, surgeries, and ER visits in previous 12 months J.  [place] such as hearing and vision if needed, cognitive and depression L. Referrals and appointments   In addition, I have reviewed and discussed with patient certain preventive protocols, quality metrics, and best practice recommendations. A written personalized care plan for preventive services as well as general preventive health recommendations were provided to patient.   Signed,  Clemetine Marker, LPN Nurse Health Advisor   Nurse Notes: patient reports testing positive for Covid-19 on 12/18/19 after symptoms began on 12/13/19. Pt states she is starting to feel a little better but will notify office if needed for follow up.

## 2019-12-25 NOTE — Patient Instructions (Signed)
Nicole Cook , Thank you for taking time to come for your Medicare Wellness Visit. I appreciate your ongoing commitment to your health goals. Please review the following plan we discussed and let me know if I can assist you in the future.   Screening recommendations/referrals: Colonoscopy: done 01/24/18 Mammogram: done 04/12/18 Bone Density: done 03/10/17 Recommended yearly ophthalmology/optometry visit for glaucoma screening and checkup Recommended yearly dental visit for hygiene and checkup  Vaccinations: Influenza vaccine: done 10/02/19 Pneumococcal vaccine: done 08/29/14 Tdap vaccine: done 03/02/17 Shingles vaccine: Shingrix discussed. Please contact your pharmacy for coverage information.   Advanced directives: Advance directive discussed with you today. I have provided a copy for you to complete at home and have notarized. Once this is complete please bring a copy in to our office so we can scan it into your chart.  Conditions/risks identified: Recommend healthy eating and physical activity  Next appointment: Please follow up in one year for your Medicare Annual Wellness visit.     Preventive Care 71 Years and Older, Female Preventive care refers to lifestyle choices and visits with your health care provider that can promote health and wellness. What does preventive care include?  A yearly physical exam. This is also called an annual well check.  Dental exams once or twice a year.  Routine eye exams. Ask your health care provider how often you should have your eyes checked.  Personal lifestyle choices, including:  Daily care of your teeth and gums.  Regular physical activity.  Eating a healthy diet.  Avoiding tobacco and drug use.  Limiting alcohol use.  Practicing safe sex.  Taking low-dose aspirin every day.  Taking vitamin and mineral supplements as recommended by your health care provider. What happens during an annual well check? The services and screenings  done by your health care provider during your annual well check will depend on your age, overall health, lifestyle risk factors, and family history of disease. Counseling  Your health care provider may ask you questions about your:  Alcohol use.  Tobacco use.  Drug use.  Emotional well-being.  Home and relationship well-being.  Sexual activity.  Eating habits.  History of falls.  Memory and ability to understand (cognition).  Work and work Statistician.  Reproductive health. Screening  You may have the following tests or measurements:  Height, weight, and BMI.  Blood pressure.  Lipid and cholesterol levels. These may be checked every 5 years, or more frequently if you are over 15 years old.  Skin check.  Lung cancer screening. You may have this screening every year starting at age 71 if you have a 30-pack-year history of smoking and currently smoke or have quit within the past 15 years.  Fecal occult blood test (FOBT) of the stool. You may have this test every year starting at age 71.  Flexible sigmoidoscopy or colonoscopy. You may have a sigmoidoscopy every 5 years or a colonoscopy every 10 years starting at age 85.  Hepatitis C blood test.  Hepatitis B blood test.  Sexually transmitted disease (STD) testing.  Diabetes screening. This is done by checking your blood sugar (glucose) after you have not eaten for a while (fasting). You may have this done every 1-3 years.  Bone density scan. This is done to screen for osteoporosis. You may have this done starting at age 71.  Mammogram. This may be done every 1-2 years. Talk to your health care provider about how often you should have regular mammograms. Talk with your health care  provider about your test results, treatment options, and if necessary, the need for more tests. Vaccines  Your health care provider may recommend certain vaccines, such as:  Influenza vaccine. This is recommended every year.  Tetanus,  diphtheria, and acellular pertussis (Tdap, Td) vaccine. You may need a Td booster every 10 years.  Zoster vaccine. You may need this after age 71.  Pneumococcal 13-valent conjugate (PCV13) vaccine. One dose is recommended after age 71.  Pneumococcal polysaccharide (PPSV23) vaccine. One dose is recommended after age 72. Talk to your health care provider about which screenings and vaccines you need and how often you need them. This information is not intended to replace advice given to you by your health care provider. Make sure you discuss any questions you have with your health care provider. Document Released: 12/27/2015 Document Revised: 08/19/2016 Document Reviewed: 10/01/2015 Elsevier Interactive Patient Education  2017 [place] Prevention in the Home Falls can cause injuries. They can happen to people of all ages. There are many things you can do to make your home safe and to help prevent falls. What can I do on the outside of my home?  Regularly fix the edges of walkways and driveways and fix any cracks.  Remove anything that might make you trip as you walk through a door, such as a raised step or threshold.  Trim any bushes or trees on the path to your home.  Use bright outdoor lighting.  Clear any walking paths of anything that might make someone trip, such as rocks or tools.  Regularly check to see if handrails are loose or broken. Make sure that both sides of any steps have handrails.  Any raised decks and porches should have guardrails on the edges.  Have any leaves, snow, or ice cleared regularly.  Use sand or salt on walking paths during winter.  Clean up any spills in your garage right away. This includes oil or grease spills. What can I do in the bathroom?  Use night lights.  Install grab bars by the toilet and in the tub and shower. Do not use towel bars as grab bars.  Use non-skid mats or decals in the tub or shower.  If you need to sit down in  the shower, use a plastic, non-slip stool.  Keep the floor dry. Clean up any water that spills on the floor as soon as it happens.  Remove soap buildup in the tub or shower regularly.  Attach bath mats securely with double-sided non-slip rug tape.  Do not have throw rugs and other things on the floor that can make you trip. What can I do in the bedroom?  Use night lights.  Make sure that you have a light by your bed that is easy to reach.  Do not use any sheets or blankets that are too big for your bed. They should not hang down onto the floor.  Have a firm chair that has side arms. You can use this for support while you get dressed.  Do not have throw rugs and other things on the floor that can make you trip. What can I do in the kitchen?  Clean up any spills right away.  Avoid walking on wet floors.  Keep items that you use a lot in easy-to-reach places.  If you need to reach something above you, use a strong step stool that has a grab bar.  Keep electrical cords out of the way.  Do not use floor polish  or wax that makes floors slippery. If you must use wax, use non-skid floor wax.  Do not have throw rugs and other things on the floor that can make you trip. What can I do with my stairs?  Do not leave any items on the stairs.  Make sure that there are handrails on both sides of the stairs and use them. Fix handrails that are broken or loose. Make sure that handrails are as long as the stairways.  Check any carpeting to make sure that it is firmly attached to the stairs. Fix any carpet that is loose or worn.  Avoid having throw rugs at the top or bottom of the stairs. If you do have throw rugs, attach them to the floor with carpet tape.  Make sure that you have a light switch at the top of the stairs and the bottom of the stairs. If you do not have them, ask someone to add them for you. What else can I do to help prevent falls?  Wear shoes that:  Do not have high  heels.  Have rubber bottoms.  Are comfortable and fit you well.  Are closed at the toe. Do not wear sandals.  If you use a stepladder:  Make sure that it is fully opened. Do not climb a closed stepladder.  Make sure that both sides of the stepladder are locked into place.  Ask someone to hold it for you, if possible.  Clearly mark and make sure that you can see:  Any grab bars or handrails.  First and last steps.  Where the edge of each step is.  Use tools that help you move around (mobility aids) if they are needed. These include:  Canes.  Walkers.  Scooters.  Crutches.  Turn on the lights when you go into a dark area. Replace any light bulbs as soon as they burn out.  Set up your furniture so you have a clear path. Avoid moving your furniture around.  If any of your floors are uneven, fix them.  If there are any pets around you, be aware of where they are.  Review your medicines with your doctor. Some medicines can make you feel dizzy. This can increase your chance of falling. Ask your doctor what other things that you can do to help prevent falls. This information is not intended to replace advice given to you by your health care provider. Make sure you discuss any questions you have with your health care provider. Document Released: 09/26/2009 Document Revised: 05/07/2016 Document Reviewed: 01/04/2015 Elsevier Interactive Patient Education  2017 [employer].

## 2020-01-11 ENCOUNTER — Ambulatory Visit
Admission: RE | Admit: 2020-01-11 | Discharge: 2020-01-11 | Disposition: A | Discharge status: Home / Self Care | Payer: Medicare Other | Attending: Family Medicine | Admitting: Family Medicine

## 2020-01-11 ENCOUNTER — Ambulatory Visit
Admission: RE | Admit: 2020-01-11 | Discharge: 2020-01-11 | Disposition: A | Discharge status: Home / Self Care | Payer: Medicare Other | Source: Ambulatory Visit | Attending: Family Medicine | Admitting: Family Medicine

## 2020-01-11 ENCOUNTER — Encounter: Payer: Self-pay | Admitting: Family Medicine

## 2020-01-11 ENCOUNTER — Other Ambulatory Visit: Payer: Self-pay

## 2020-01-11 ENCOUNTER — Ambulatory Visit (INDEPENDENT_AMBULATORY_CARE_PROVIDER_SITE_OTHER): Payer: Medicare Other | Admitting: Family Medicine

## 2020-01-11 VITALS — BP 120/60 | HR 60 | Temp 98.6°F | Ht 65.0 in | Wt 158.0 lb

## 2020-01-11 DIAGNOSIS — K2900 Acute gastritis without bleeding: Secondary | ICD-10-CM

## 2020-01-11 DIAGNOSIS — R058 Other specified cough: Secondary | ICD-10-CM

## 2020-01-11 DIAGNOSIS — Z8616 Personal history of COVID-19: Secondary | ICD-10-CM | POA: Diagnosis not present

## 2020-01-11 DIAGNOSIS — H5789 Other specified disorders of eye and adnexa: Secondary | ICD-10-CM | POA: Diagnosis not present

## 2020-01-11 DIAGNOSIS — R05 Cough: Secondary | ICD-10-CM

## 2020-01-11 DIAGNOSIS — J0100 Acute maxillary sinusitis, unspecified: Secondary | ICD-10-CM | POA: Diagnosis not present

## 2020-01-11 MED ORDER — ONDANSETRON HCL 8 MG PO TABS: 8.0000 mg | ORAL_TABLET | Freq: Three times a day (TID) | ORAL | 0 refills | Status: DC | PRN

## 2020-01-11 MED ORDER — AZITHROMYCIN 250 MG PO TABS: ORAL_TABLET | ORAL | 0 refills | Status: DC

## 2020-01-11 MED ORDER — OFLOXACIN 0.3 % OP SOLN: 1.0000 [drp] | Freq: Four times a day (QID) | OPHTHALMIC | 0 refills | Status: DC

## 2020-01-11 NOTE — Progress Notes (Signed)
Date:  01/11/2020   Name:  Nicole Cook   DOB:  1950/12/28   MRN:  [id number]   Chief Complaint: Sinusitis (dx with positive covid on Jan 3- out of quarantine, but still having R) eye and ear pain. Sinus drainage and nausea. Has taken the Zofran that we gave her the first of the month. Clear, thick production)  Sinusitis This is a new problem. The current episode started in the past 7 days. The problem has been gradually worsening since onset. There has been no fever. The pain is mild. Associated symptoms include congestion, ear pain and sinus pressure. Pertinent negatives include no chills, coughing, diaphoresis, headaches, hoarse voice, neck pain, shortness of breath, sneezing, sore throat or swollen glands. Treatments tried: mucinex. The treatment provided mild relief.  Cough This is a new problem. The current episode started 1 to 4 weeks ago (test positve 12/17/19). The problem has been waxing and waning. The cough is productive of sputum. Associated symptoms include ear pain and nasal congestion. Pertinent negatives include no chest pain, chills, ear congestion, eye redness, fever, headaches, heartburn, hemoptysis, myalgias, postnasal drip, rash, rhinorrhea, sore throat, shortness of breath or wheezing. There is no history of environmental allergies.  GI Problem The primary symptoms include nausea. Primary symptoms do not include fever, fatigue, abdominal pain, vomiting, diarrhea, dysuria, myalgias, arthralgias or rash.  The illness does not include chills, dysphagia, constipation or back pain.    Lab Results  Component Value Date   CREATININE 0.70 11/20/2019   BUN 15 11/20/2019   NA 140 11/20/2019   K 4.4 11/20/2019   CL 98 11/20/2019   CO2 27 11/20/2019   Lab Results  Component Value Date   CHOL 133 11/20/2019   HDL 38 (L) 11/20/2019   LDLCALC 67 11/20/2019   TRIG 166 (H) 11/20/2019   CHOLHDL 3.3 03/09/2018   No results found for: TSH Lab Results  Component Value Date     HGBA1C 6.4 11/14/2019     Review of Systems  Constitutional: Negative.  Negative for chills, diaphoresis, fatigue, fever and unexpected weight change.  HENT: Positive for congestion, ear pain and sinus pressure. Negative for ear discharge, hoarse voice, postnasal drip, rhinorrhea, sneezing and sore throat.   Eyes: Negative for photophobia, pain, discharge, redness and itching.  Respiratory: Negative for cough, hemoptysis, shortness of breath, wheezing and stridor.   Cardiovascular: Negative for chest pain.  Gastrointestinal: Positive for nausea. Negative for abdominal pain, blood in stool, constipation, diarrhea, dysphagia, heartburn and vomiting.  Endocrine: Negative for cold intolerance, heat intolerance, polydipsia, polyphagia and polyuria.  Genitourinary: Negative for dysuria, flank pain, frequency, hematuria, menstrual problem, pelvic pain, urgency, vaginal bleeding and vaginal discharge.  Musculoskeletal: Negative for arthralgias, back pain, myalgias and neck pain.  Skin: Negative for pallor and rash.  Allergic/Immunologic: Negative for environmental allergies and food allergies.  Neurological: Negative for dizziness, weakness, light-headedness, numbness and headaches.  Hematological: Negative for adenopathy. Does not bruise/bleed easily.  Psychiatric/Behavioral: Negative for dysphoric mood. The patient is not nervous/anxious.     Patient Active Problem List   Diagnosis Date Noted  . Hyperlipidemia associated with type 2 diabetes mellitus ([place]) 12/25/2019  . Atrophic vaginitis 11/23/2018  . Pelvic floor relaxation 11/23/2018  . Primary osteoarthritis of right knee 05/12/2018  . Sacral back pain 08/04/2017  . Taking medication for chronic disease 03/02/2017  . Seasonal allergic rhinitis due to pollen 03/02/2017  . Gastroesophageal reflux disease 03/02/2017  . Migraine with aura and without status  migrainosus, not intractable 03/02/2017  . Angina pectoris ([place]) 11/09/2016  .  Chronic right-sided low back pain without sciatica 10/29/2016  . Trochanteric bursitis of right hip 07/31/2015  . Primary osteoarthritis of right hip 07/31/2015  . Type 2 diabetes mellitus with hyperglycemia, with long-term current use of insulin ([place]) 04/03/2015  . Hyperlipidemia 04/03/2015  . Laboratory animal allergy 04/03/2015  . Cephalalgia 04/03/2015  . Essential hypertension 04/03/2015  . Blood glucose elevated 04/03/2015  . Routine general medical examination at a health care facility 04/03/2015  . CAD (coronary artery disease) 04/03/2015  . Dermatochalasis of both upper eyelids 09/20/2014  . Sleep apnea 09/03/2014  . DDD (degenerative disc disease), lumbosacral 03/03/2012  . Spinal stenosis of lumbar region without neurogenic claudication 03/03/2012  . Thoracic or lumbosacral neuritis or radiculitis, unspecified 03/03/2012    Allergies  Allergen Reactions  . Aspartame Other (See Comments)  . Compazine [Prochlorperazine]   . Penicillins   . Sulfa Antibiotics   . Sulfamethoxazole-Trimethoprim Hives and Other (See Comments)    Other Reaction: Not Assessed     Past Surgical History:  Procedure Laterality Date  . back surgery x 2    . c-section x 3    . COLONOSCOPY  2011   cleared for 5 yrs- Dr Beckey Downing  . KNEE ARTHROSCOPY Right 2010  . VAGINAL HYSTERECTOMY  12/14/1994    Social History   Tobacco Use  . Smoking status: Never Smoker  . Smokeless tobacco: Never Used  Substance Use Topics  . Alcohol use: No    Alcohol/week: 0.0 standard drinks  . Drug use: No     Medication list has been reviewed and updated.  Current Meds  Medication Sig  . ACCU-CHEK FASTCLIX LANCETS MISC Accu-Chek [employer]  . amLODipine (NORVASC) 5 MG tablet Take 1 tablet (5 mg total) by mouth daily.  Marland Kitchen aspirin 81 MG tablet Take 1 tablet by mouth daily.  . cetirizine (ZYRTEC) 10 MG tablet Take 10 mg by mouth daily. otc  . Cholecalciferol (VITAMIN D3) 1000 units CAPS Take 1  capsule by mouth daily.  . Coenzyme Q10 (EQL COQ10) 300 MG CAPS Take 1 capsule by mouth daily.  . Dulaglutide (TRULICITY) A999333 [id number] SOPN Inject 1 each into the skin once a week. Dr Eddie Dibbles   . estradiol (ESTRING) 2 MG vaginal ring Place 2 mg vaginally every 3 (three) months. follow package directions- GYN  . fluticasone (FLONASE) 50 MCG/ACT nasal spray One spray each nostril daily  . gabapentin (NEURONTIN) 300 MG capsule Take 2 capsules by mouth 3 (three) times daily. [facility]- may take 3 capsules 3 times per day per pt  . hydrochlorothiazide (HYDRODIURIL) 25 MG tablet Take 1 tablet (25 mg total) by mouth every morning.  Marland Kitchen HYDROcodone-acetaminophen (NORCO) 10-325 MG per tablet Take 1 tablet by mouth as needed. [facility]  . insulin lispro (HUMALOG KWIKPEN) 100 UNIT/ML KwikPen Humalog KwikPen (U-100) Insulin 100 unit/mL subcutaneous  INJECT 2-8 UNITS BEFORE MEALS AS DIRECTED  . Lido-Capsaicin-Men-Methyl Sal 0.5-0.035-5-20 % PTCH Apply 1 application topically as needed. [facility]  . lidocaine (LIDODERM) 5 % lidocaine 5 % topical patch  APPLY 1 PATCH DAILY (12 HOURS ON AND 12 HOURS OFF)  . lisinopril (ZESTRIL) 10 MG tablet Take 1 tablet (10 mg total) by mouth daily.  . Magnesium 500 MG CAPS Take 1 capsule by mouth daily.  . metFORMIN (GLUCOPHAGE) 1000 MG tablet 1 tablet 2 (two) times daily.  . metoprolol succinate (TOPROL-XL) 25 MG 24 hr tablet  Take 1 tablet (25 mg total) by mouth daily. Cardio- takes with 50mg = 75  . metoprolol succinate (TOPROL-XL) 50 MG 24 hr tablet TAKE 1 TABLET DAILY WITH OR IMMEDIATELY FOLLOWING A MEAL  . montelukast (SINGULAIR) 10 MG tablet Take 1 tablet (10 mg total) by mouth daily.  . Multiple Vitamins-Minerals (CENTRUM SILVER ULTRA WOMENS PO) Take 1 tablet by mouth daily.  . nitroGLYCERIN (NITROSTAT) 0.4 MG SL tablet Place 1 tablet under the tongue as needed.  Marland Kitchen olopatadine (PATANOL) 0.1 % ophthalmic solution Place 1 drop into both eyes 2 (two) times daily.  . ONE TOUCH  ULTRA TEST test strip   . OXYCONTIN 10 MG 12 hr tablet Take 10 mg by mouth 2 (two) times daily. Pain Dr  . pantoprazole (PROTONIX) 40 MG tablet Take 1 tablet (40 mg total) by mouth daily.  . rosuvastatin (CRESTOR) 20 MG tablet TAKE 1 TABLET DAILY  . SUMAtriptan (IMITREX) 50 MG tablet Take 1 tablet (50 mg total) by mouth as needed. Per 90 days  . tiZANidine (ZANAFLEX) 4 MG tablet tizanidine 4 mg tablet  TAKE 1-2 TABLETS BY MOUTH AT NIGHT AT BEDTIME  . vitamin B-12 (CYANOCOBALAMIN) 1000 MCG tablet Take 1,000 mcg by mouth daily.    PHQ 2/9 Scores 12/25/2019 11/20/2019 05/24/2019 11/23/2018  PHQ - 2 Score 0 0 0 0  PHQ- 9 Score - 0 0 1    BP Readings from Last 3 Encounters:  01/11/20 120/60  11/20/19 130/60  05/24/19 118/64    Physical Exam Vitals and nursing note reviewed.  Constitutional:      General: She is not in acute distress.    Appearance: She is not diaphoretic.  HENT:     Head: Normocephalic and atraumatic.     Jaw: There is normal jaw occlusion.     Right Ear: Hearing, ear canal and external ear normal. No decreased hearing noted. No tenderness. A middle ear effusion is present. There is no impacted cerumen. Tympanic membrane is not erythematous.     Left Ear: Hearing, ear canal and external ear normal. No decreased hearing noted. No tenderness. A middle ear effusion is present. There is no impacted cerumen. Tympanic membrane is not erythematous.     Nose: Congestion present. No rhinorrhea.     Right Sinus: Maxillary sinus tenderness present. No frontal sinus tenderness.     Left Sinus: Maxillary sinus tenderness present. No frontal sinus tenderness.     Comments: R>L maxillary    Mouth/Throat:     Lips: Pink.     Mouth: Mucous membranes are moist.     Palate: No mass.     Pharynx: Oropharynx is clear. Uvula midline. No pharyngeal swelling or posterior oropharyngeal erythema.  Eyes:     General: Lids are normal.        Right eye: No discharge.        Left eye: No  discharge.     Extraocular Movements: Extraocular movements intact.     Conjunctiva/sclera: Conjunctivae normal.     Pupils: Pupils are equal, round, and reactive to light. Pupils are equal.     Funduscopic exam:    Right eye: No hemorrhage or papilledema. Red reflex present.        Left eye: No hemorrhage or papilledema. Red reflex present.    Comments: Minimal erythema right conjunctiva/no cobblestone  Neck:     Thyroid: No thyroid mass, thyromegaly or thyroid tenderness.     Vascular: Normal carotid pulses. No carotid bruit, hepatojugular reflux or  JVD.     Trachea: Trachea normal.  Cardiovascular:     Rate and Rhythm: Normal rate and regular rhythm.     Pulses: Normal pulses.     Heart sounds: Normal heart sounds, S1 normal and S2 normal. No murmur. No systolic murmur. No diastolic murmur. No friction rub. No gallop. No S3 or S4 sounds.   Pulmonary:     Effort: Pulmonary effort is normal. No tachypnea or respiratory distress.     Breath sounds: Normal breath sounds. No stridor or decreased air movement. No decreased breath sounds, wheezing, rhonchi or rales.  Abdominal:     General: Bowel sounds are normal.     Palpations: Abdomen is soft. There is no hepatomegaly, splenomegaly or mass.     Tenderness: There is no abdominal tenderness. There is no guarding.  Musculoskeletal:        General: Normal range of motion.     Cervical back: Normal range of motion and neck supple. No rigidity.     Right lower leg: No edema.     Left lower leg: No edema.  Lymphadenopathy:     Head:     Right side of head: No submental, submandibular or tonsillar adenopathy.     Left side of head: No submental, submandibular or tonsillar adenopathy.     Cervical: No cervical adenopathy.     Right cervical: No superficial, deep or posterior cervical adenopathy.    Left cervical: No superficial, deep or posterior cervical adenopathy.  Skin:    General: Skin is warm and dry.  Neurological:     Mental  Status: She is alert.     Deep Tendon Reflexes: Reflexes are normal and symmetric.     Wt Readings from Last 3 Encounters:  01/11/20 158 lb (71.7 kg)  12/25/19 160 lb (72.6 kg)  11/20/19 160 lb (72.6 kg)    BP 120/60   Pulse 60   Ht 5\' 5"  (1.651 m)   Wt 158 lb (71.7 kg)   SpO2 98%   BMI 26.29 kg/m   Assessment and Plan:  1. Acute maxillary sinusitis, recurrence not specified Acute.  Persistent.  Uncontrolled history and physical exam tenderness over the right maxillary sinus.  Will initiate azithromycin 250 mg 2 tabs today followed by 1 a day for 4 days.  Ears were examined and there was no evidence of erythema to suggest infection at most he might be a little bit of a serous accumulation to his suggest some eustachian tube dysfunction. - azithromycin (ZITHROMAX) 250 MG tablet; 2 today then 1 a day for 4 days  Dispense: 6 tablet; Refill: 0  2. Cough productive of clear sputum Patient's had a continuous cough status post Covid infection from earlier in January.  This is nonproductive and without fever at this time but we will do a chest x-ray to see if there is any residual pulmonary concerns for Covid.  If there is any chance of the CAP we will cover with azithromycin for the sinus infection and this should be taken care of as well - DG Chest 2 View; Future  3. History of COVID-19 Patient was diagnosed January 3 with Covid.  Patient is beyond the 2-week.  And has not had any further fever but has had some persistence of concerns including sinus drainage, nausea, and discomfort of her right ear and right eye. - DG Chest 2 View; Future  4. Acute gastritis without hemorrhage, unspecified gastritis type New onset.  Recurrence during COVID-19 infectious.  Patient had gastritis during the course of her infection and this was controlled with Zofran.  Was under the impression that patient took 4 mg half of an 8 but looks like she may have taken half of a 4 we gave her 8 mg to use for one  half as needed every 8 hours as needed for nausea. - ondansetron (ZOFRAN) 8 MG tablet; Take 1 tablet (8 mg total) by mouth every 8 (eight) hours as needed for nausea or vomiting.  Dispense: 20 tablet; Refill: 0  5. Irritation of eye New onset.  Occurred during the Covid infection.  Persistent.  This may be a conjunctivitis of a allergic nature but has not resolved in a normal time course.  The only thing that has not been covered is a bacterial infection of which we will cover with antibiotic drops.  Patient has a irritation of the right eye but there is no significant evidence of infection except for mild erythema of the right conjunctiva.  There is no scleral, periorbital concerns.  Rather doubt that she has a residual bacterial infection but we will cover for this since this is the only thing that is not been attended to.  Patient is allergic to sulfa and we will initiate ofloxacin ophthalmic drops 4 times a day for 5 days.  Pain may be secondary to some sphenoid and ethmoid involvement.  Infection or pressure concern. - ofloxacin (OCUFLOX) 0.3 % ophthalmic solution; Place 1 drop into the right eye 4 (four) times daily.  Dispense: 5 mL; Refill: 0  Addendum patient's chest x-ray was reviewed and there was no cardiopulmonary concerns noted on x-ray.

## 2020-01-16 ENCOUNTER — Other Ambulatory Visit: Payer: Self-pay

## 2020-01-16 ENCOUNTER — Ambulatory Visit
Admission: RE | Admit: 2020-01-16 | Discharge: 2020-01-16 | Disposition: A | Discharge status: Home / Self Care | Payer: Medicare Other | Source: Ambulatory Visit | Attending: Family Medicine | Admitting: Family Medicine

## 2020-01-16 DIAGNOSIS — Z1231 Encounter for screening mammogram for malignant neoplasm of breast: Secondary | ICD-10-CM | POA: Diagnosis not present

## 2020-01-17 ENCOUNTER — Ambulatory Visit
Admission: RE | Admit: 2020-01-17 | Discharge: 2020-01-17 | Disposition: A | Discharge status: Home / Self Care | Payer: Medicare Other | Source: Ambulatory Visit | Attending: Family Medicine | Admitting: Family Medicine

## 2020-01-17 ENCOUNTER — Other Ambulatory Visit: Payer: Self-pay

## 2020-01-17 DIAGNOSIS — Z780 Asymptomatic menopausal state: Secondary | ICD-10-CM | POA: Diagnosis not present

## 2020-01-17 DIAGNOSIS — E2839 Other primary ovarian failure: Secondary | ICD-10-CM | POA: Diagnosis not present

## 2020-02-22 DIAGNOSIS — Z808 Family history of malignant neoplasm of other organs or systems: Secondary | ICD-10-CM | POA: Diagnosis not present

## 2020-02-22 DIAGNOSIS — L812 Freckles: Secondary | ICD-10-CM | POA: Diagnosis not present

## 2020-02-22 DIAGNOSIS — D1801 Hemangioma of skin and subcutaneous tissue: Secondary | ICD-10-CM | POA: Diagnosis not present

## 2020-02-22 DIAGNOSIS — Z85828 Personal history of other malignant neoplasm of skin: Secondary | ICD-10-CM | POA: Diagnosis not present

## 2020-02-22 DIAGNOSIS — L821 Other seborrheic keratosis: Secondary | ICD-10-CM | POA: Diagnosis not present

## 2020-02-22 DIAGNOSIS — Z1283 Encounter for screening for malignant neoplasm of skin: Secondary | ICD-10-CM | POA: Diagnosis not present

## 2020-02-22 DIAGNOSIS — D225 Melanocytic nevi of trunk: Secondary | ICD-10-CM | POA: Diagnosis not present

## 2020-04-03 ENCOUNTER — Other Ambulatory Visit: Payer: Self-pay | Admitting: Family Medicine

## 2020-04-03 DIAGNOSIS — I10 Essential (primary) hypertension: Secondary | ICD-10-CM

## 2020-05-03 DIAGNOSIS — E1142 Type 2 diabetes mellitus with diabetic polyneuropathy: Secondary | ICD-10-CM | POA: Diagnosis not present

## 2020-05-03 DIAGNOSIS — E785 Hyperlipidemia, unspecified: Secondary | ICD-10-CM | POA: Diagnosis not present

## 2020-05-03 DIAGNOSIS — E1169 Type 2 diabetes mellitus with other specified complication: Secondary | ICD-10-CM | POA: Diagnosis not present

## 2020-05-03 DIAGNOSIS — I152 Hypertension secondary to endocrine disorders: Secondary | ICD-10-CM | POA: Diagnosis not present

## 2020-05-03 DIAGNOSIS — E1159 Type 2 diabetes mellitus with other circulatory complications: Secondary | ICD-10-CM | POA: Diagnosis not present

## 2020-05-03 LAB — MICROALBUMIN, URINE: Microalb, Ur: 42.5

## 2020-05-03 LAB — HM DIABETES FOOT EXAM: HM Diabetic Foot Exam: NORMAL

## 2020-05-03 LAB — HEMOGLOBIN A1C: Hemoglobin A1C: 6.8

## 2020-05-18 ENCOUNTER — Other Ambulatory Visit: Payer: Self-pay | Admitting: Family Medicine

## 2020-05-18 DIAGNOSIS — J301 Allergic rhinitis due to pollen: Secondary | ICD-10-CM

## 2020-05-18 DIAGNOSIS — I10 Essential (primary) hypertension: Secondary | ICD-10-CM

## 2020-05-18 NOTE — Telephone Encounter (Signed)
Approved per protocol.  

## 2020-05-21 ENCOUNTER — Ambulatory Visit (INDEPENDENT_AMBULATORY_CARE_PROVIDER_SITE_OTHER): Payer: Medicare Other | Admitting: Family Medicine

## 2020-05-21 ENCOUNTER — Encounter: Payer: Self-pay | Admitting: Family Medicine

## 2020-05-21 ENCOUNTER — Other Ambulatory Visit: Payer: Self-pay

## 2020-05-21 VITALS — BP 120/60 | HR 68 | Ht 65.0 in | Wt 166.0 lb

## 2020-05-21 DIAGNOSIS — E785 Hyperlipidemia, unspecified: Secondary | ICD-10-CM

## 2020-05-21 DIAGNOSIS — R69 Illness, unspecified: Secondary | ICD-10-CM | POA: Diagnosis not present

## 2020-05-21 DIAGNOSIS — K219 Gastro-esophageal reflux disease without esophagitis: Secondary | ICD-10-CM | POA: Diagnosis not present

## 2020-05-21 DIAGNOSIS — I10 Essential (primary) hypertension: Secondary | ICD-10-CM

## 2020-05-21 DIAGNOSIS — J301 Allergic rhinitis due to pollen: Secondary | ICD-10-CM | POA: Diagnosis not present

## 2020-05-21 DIAGNOSIS — G43109 Migraine with aura, not intractable, without status migrainosus: Secondary | ICD-10-CM | POA: Diagnosis not present

## 2020-05-21 MED ORDER — LISINOPRIL 10 MG PO TABS: 10.0000 mg | ORAL_TABLET | Freq: Every day | ORAL | 1 refills | Status: DC

## 2020-05-21 MED ORDER — SUMATRIPTAN SUCCINATE 50 MG PO TABS: 50.0000 mg | ORAL_TABLET | ORAL | 1 refills | Status: DC | PRN

## 2020-05-21 MED ORDER — ROSUVASTATIN CALCIUM 20 MG PO TABS: ORAL_TABLET | ORAL | 1 refills | Status: DC

## 2020-05-21 MED ORDER — FLUTICASONE PROPIONATE 50 MCG/ACT NA SUSP: NASAL | 1 refills | Status: DC

## 2020-05-21 MED ORDER — METOPROLOL SUCCINATE ER 50 MG PO TB24: ORAL_TABLET | ORAL | 1 refills | Status: DC

## 2020-05-21 MED ORDER — PANTOPRAZOLE SODIUM 40 MG PO TBEC: 40.0000 mg | DELAYED_RELEASE_TABLET | Freq: Every day | ORAL | 1 refills | Status: DC

## 2020-05-21 MED ORDER — HYDROCHLOROTHIAZIDE 25 MG PO TABS: 25.0000 mg | ORAL_TABLET | Freq: Every morning | ORAL | 1 refills | Status: DC

## 2020-05-21 MED ORDER — MONTELUKAST SODIUM 10 MG PO TABS: 10.0000 mg | ORAL_TABLET | Freq: Every day | ORAL | 1 refills | Status: DC

## 2020-05-21 MED ORDER — METOPROLOL SUCCINATE ER 25 MG PO TB24: 25.0000 mg | ORAL_TABLET | Freq: Every day | ORAL | 1 refills | Status: DC

## 2020-05-21 MED ORDER — AMLODIPINE BESYLATE 5 MG PO TABS: 5.0000 mg | ORAL_TABLET | Freq: Every day | ORAL | 1 refills | Status: DC

## 2020-05-21 NOTE — Progress Notes (Signed)
Date:  05/21/2020   Name:  Nicole Cook   DOB:  [birth year]   MRN:  [id number]   Chief Complaint: Hypertension, Hyperlipidemia, Gastroesophageal Reflux, Headache, and Allergic Rhinitis   Hypertension This is a chronic problem. The current episode started more than 1 year ago. The problem has been gradually improving since onset. The problem is controlled. Associated symptoms include headaches. Pertinent negatives include no anxiety, blurred vision, chest pain, neck pain, orthopnea, palpitations, peripheral edema, shortness of breath or sweats. There are no associated agents to hypertension. Risk factors for coronary artery disease include diabetes mellitus, dyslipidemia and post-menopausal state. Past treatments include ACE inhibitors, diuretics, calcium channel blockers and beta blockers. There are no compliance problems.  Hypertensive end-organ damage includes CAD/MI. There is no history of angina, kidney disease, CVA, heart failure, left ventricular hypertrophy or PVD. There is no history of chronic renal disease, a hypertension causing med or renovascular disease.  Hyperlipidemia This is a chronic problem. The current episode started more than 1 year ago. The problem is controlled. Recent lipid tests were reviewed and are normal. Exacerbating diseases include diabetes. She has no history of chronic renal disease, hypothyroidism, liver disease, obesity or nephrotic syndrome. There are no known factors aggravating her hyperlipidemia. Pertinent negatives include no chest pain, focal sensory loss, focal weakness, leg pain, myalgias or shortness of breath. Current antihyperlipidemic treatment includes statins. The current treatment provides moderate improvement of lipids. There are no compliance problems.   Gastroesophageal Reflux She reports no abdominal pain, no belching, no chest pain, no choking, no coughing, no dysphagia, no early satiety, no globus sensation, no heartburn, no hoarse voice, no  nausea, no sore throat, no stridor, no tooth decay, no water brash or no wheezing. This is a chronic problem. The current episode started more than 1 year ago. The problem occurs frequently. The problem has been waxing and waning. The symptoms are aggravated by certain foods. Pertinent negatives include no anemia, fatigue, melena, muscle weakness, orthopnea or weight loss. She has tried a PPI for the symptoms. The treatment provided moderate relief.  Headache  This is a chronic problem. The current episode started more than 1 year ago. Episode frequency: none in 2 months. The problem has been gradually improving. Pertinent negatives include no abdominal pain, blurred vision, coughing, nausea, neck pain, sore throat or weight loss. She has tried triptans for the symptoms. The treatment provided moderate relief. Her past medical history is significant for hypertension. There is no history of obesity.    Lab Results  Component Value Date   CREATININE 0.70 11/20/2019   BUN 15 11/20/2019   NA 140 11/20/2019   K 4.4 11/20/2019   CL 98 11/20/2019   CO2 27 11/20/2019   Lab Results  Component Value Date   CHOL 133 11/20/2019   HDL 38 (L) 11/20/2019   LDLCALC 67 11/20/2019   TRIG 166 (H) 11/20/2019   CHOLHDL 3.3 03/09/2018   No results found for: TSH Lab Results  Component Value Date   HGBA1C 6.8 05/03/2020   Lab Results  Component Value Date   WBC 5.0 10/10/2018   HGB 12.4 10/10/2018   HCT 37.2 10/10/2018   MCV 86 10/10/2018   PLT 185 10/10/2018   Lab Results  Component Value Date   ALT 17 11/20/2019   AST 14 11/20/2019   ALKPHOS 46 11/20/2019   BILITOT 0.3 11/20/2019     Review of Systems  Constitutional: Negative for fatigue and weight loss.  HENT:  Negative for hoarse voice and sore throat.   Eyes: Negative for blurred vision.  Respiratory: Negative for cough, choking, shortness of breath and wheezing.   Cardiovascular: Negative for chest pain, palpitations and orthopnea.    Gastrointestinal: Negative for abdominal pain, dysphagia, heartburn, melena and nausea.  Musculoskeletal: Negative for myalgias, muscle weakness and neck pain.  Neurological: Positive for headaches. Negative for focal weakness.    Patient Active Problem List   Diagnosis Date Noted  . Hyperlipidemia associated with type 2 diabetes mellitus ([place]) 12/25/2019  . Atrophic vaginitis 11/23/2018  . Pelvic floor relaxation 11/23/2018  . Primary osteoarthritis of right knee 05/12/2018  . Sacral back pain 08/04/2017  . Taking medication for chronic disease 03/02/2017  . Seasonal allergic rhinitis due to pollen 03/02/2017  . Gastroesophageal reflux disease 03/02/2017  . Migraine with aura and without status migrainosus, not intractable 03/02/2017  . Angina pectoris ([place]) 11/09/2016  . Chronic right-sided low back pain without sciatica 10/29/2016  . Trochanteric bursitis of right hip 07/31/2015  . Primary osteoarthritis of right hip 07/31/2015  . Type 2 diabetes mellitus with hyperglycemia, with long-term current use of insulin ([place]) 04/03/2015  . Hyperlipidemia 04/03/2015  . Laboratory animal allergy 04/03/2015  . Cephalalgia 04/03/2015  . Essential hypertension 04/03/2015  . Blood glucose elevated 04/03/2015  . Routine general medical examination at a health care facility 04/03/2015  . CAD (coronary artery disease) 04/03/2015  . Dermatochalasis of both upper eyelids 09/20/2014  . Sleep apnea 09/03/2014  . DDD (degenerative disc disease), lumbosacral 03/03/2012  . Spinal stenosis of lumbar region without neurogenic claudication 03/03/2012  . Thoracic or lumbosacral neuritis or radiculitis, unspecified 03/03/2012    Allergies  Allergen Reactions  . Aspartame Other (See Comments)  . Compazine [Prochlorperazine]   . Penicillins   . Sulfa Antibiotics   . Sulfamethoxazole-Trimethoprim Hives and Other (See Comments)    Other Reaction: Not Assessed     Past Surgical History:  Procedure  Laterality Date  . back surgery x 2    . c-section x 3    . COLONOSCOPY  2011   cleared for 5 yrs- Dr Beckey Downing  . KNEE ARTHROSCOPY Right 2010  . VAGINAL HYSTERECTOMY  12/14/1994    Social History   Tobacco Use  . Smoking status: Never Smoker  . Smokeless tobacco: Never Used  Substance Use Topics  . Alcohol use: No    Alcohol/week: 0.0 standard drinks  . Drug use: No     Medication list has been reviewed and updated.  Current Meds  Medication Sig  . ACCU-CHEK FASTCLIX LANCETS MISC Accu-Chek [employer]  . amLODipine (NORVASC) 5 MG tablet TAKE 1 TABLET DAILY  . aspirin 81 MG tablet Take 1 tablet by mouth daily.  . cetirizine (ZYRTEC) 10 MG tablet Take 10 mg by mouth daily. otc  . Cholecalciferol (VITAMIN D3) 1000 units CAPS Take 1 capsule by mouth daily.  . Coenzyme Q10 (EQL COQ10) 300 MG CAPS Take 1 capsule by mouth daily.  . Dulaglutide (TRULICITY) 2.50 NL/9.7QB SOPN Inject 1 each into the skin once a week. Dr Eddie Dibbles   . estradiol (ESTRING) 2 MG vaginal ring Place 2 mg vaginally every 3 (three) months. follow package directions- GYN  . fluticasone (FLONASE) 50 MCG/ACT nasal spray One spray each nostril daily  . gabapentin (NEURONTIN) 300 MG capsule Take 2 capsules by mouth 3 (three) times daily. [facility]- may take 3 capsules 3 times per day per pt  . hydrochlorothiazide (HYDRODIURIL) 25 MG tablet  TAKE 1 TABLET EVERY MORNING  . HYDROcodone-acetaminophen (NORCO) 10-325 MG per tablet Take 1 tablet by mouth as needed. [facility]  . insulin lispro (HUMALOG KWIKPEN) 100 UNIT/ML KwikPen Humalog KwikPen (U-100) Insulin 100 unit/mL subcutaneous  INJECT 2-8 UNITS BEFORE MEALS AS DIRECTED  . Lido-Capsaicin-Men-Methyl Sal 0.5-0.035-5-20 % PTCH Apply 1 application topically as needed. [facility]  . lidocaine (LIDODERM) 5 % lidocaine 5 % topical patch  APPLY 1 PATCH DAILY (12 HOURS ON AND 12 HOURS OFF)  . lisinopril (ZESTRIL) 10 MG tablet Take 1 tablet (10 mg total) by mouth daily.    . Magnesium 500 MG CAPS Take 1 capsule by mouth daily.  . metFORMIN (GLUCOPHAGE) 1000 MG tablet 1 tablet 2 (two) times daily.  . metoprolol succinate (TOPROL-XL) 25 MG 24 hr tablet Take 1 tablet (25 mg total) by mouth daily. Cardio- takes with 50mg = 75  . metoprolol succinate (TOPROL-XL) 50 MG 24 hr tablet TAKE 1 TABLET DAILY WITH OR IMMEDIATELY FOLLOWING A MEAL  . montelukast (SINGULAIR) 10 MG tablet TAKE 1 TABLET DAILY  . Multiple Vitamins-Minerals (CENTRUM SILVER ULTRA WOMENS PO) Take 1 tablet by mouth daily.  . nitroGLYCERIN (NITROSTAT) 0.4 MG SL tablet Place 1 tablet under the tongue as needed.  Marland Kitchen olopatadine (PATANOL) 0.1 % ophthalmic solution Place 1 drop into both eyes 2 (two) times daily.  . ONE TOUCH ULTRA TEST test strip   . OXYCONTIN 10 MG 12 hr tablet Take 10 mg by mouth 2 (two) times daily. Pain Dr  . pantoprazole (PROTONIX) 40 MG tablet Take 1 tablet (40 mg total) by mouth daily.  . rosuvastatin (CRESTOR) 20 MG tablet TAKE 1 TABLET DAILY  . SUMAtriptan (IMITREX) 50 MG tablet Take 1 tablet (50 mg total) by mouth as needed. Per 90 days  . tiZANidine (ZANAFLEX) 4 MG tablet tizanidine 4 mg tablet  TAKE 1-2 TABLETS BY MOUTH AT NIGHT AT BEDTIME  . vitamin B-12 (CYANOCOBALAMIN) 1000 MCG tablet Take 1,000 mcg by mouth daily.  . [DISCONTINUED] ofloxacin (OCUFLOX) 0.3 % ophthalmic solution Place 1 drop into the right eye 4 (four) times daily.  . [DISCONTINUED] ondansetron (ZOFRAN) 8 MG tablet Take 1 tablet (8 mg total) by mouth every 8 (eight) hours as needed for nausea or vomiting.    PHQ 2/9 Scores 05/21/2020 12/25/2019 11/20/2019 05/24/2019  PHQ - 2 Score 0 0 0 0  PHQ- 9 Score 0 - 0 0    BP Readings from Last 3 Encounters:  05/21/20 120/60  01/11/20 120/60  11/20/19 130/60    Physical Exam  Wt Readings from Last 3 Encounters:  05/21/20 166 lb (75.3 kg)  01/11/20 158 lb (71.7 kg)  12/25/19 160 lb (72.6 kg)    BP 120/60   Pulse 68   Ht 5\' 5"  (1.651 m)   Wt 166 lb (75.3  kg)   BMI 27.62 kg/m   Assessment and Plan:  1. Essential hypertension Chronic.  Controlled.  Stable.  Continue amlodipine 5 mg once a day, lisinopril 10 mg once a day metoprolol XL 25 mg 1 tablet daily and takes with 50 mg from cardiology.  Which is a 50 mg which equals 75.  And will recheck renal function panel. - amLODipine (NORVASC) 5 MG tablet; Take 1 tablet (5 mg total) by mouth daily.  Dispense: 90 tablet; Refill: 1 - hydrochlorothiazide (HYDRODIURIL) 25 MG tablet; Take 1 tablet (25 mg total) by mouth every morning.  Dispense: 90 tablet; Refill: 1 - lisinopril (ZESTRIL) 10 MG tablet; Take 1  tablet (10 mg total) by mouth daily.  Dispense: 90 tablet; Refill: 1 - metoprolol succinate (TOPROL-XL) 25 MG 24 hr tablet; Take 1 tablet (25 mg total) by mouth daily. Cardio- takes with 50mg = 75  Dispense: 90 tablet; Refill: 1 - metoprolol succinate (TOPROL-XL) 50 MG 24 hr tablet; Take with or immediately following a meal.  Dispense: 90 tablet; Refill: 1 - Renal Function Panel  2. Seasonal allergic rhinitis due to pollen Chronic.  Episodic.  Stable.  Continue fluticasone and Singulair. - fluticasone (FLONASE) 50 MCG/ACT nasal spray; One spray each nostril daily  Dispense: 48 g; Refill: 1 - montelukast (SINGULAIR) 10 MG tablet; Take 1 tablet (10 mg total) by mouth daily.  Dispense: 90 tablet; Refill: 1  3. Gastroesophageal reflux disease, unspecified whether esophagitis present Chronic.  Controlled.  Stable.  Continue pantoprazole 40 mg once a day. - pantoprazole (PROTONIX) 40 MG tablet; Take 1 tablet (40 mg total) by mouth daily.  Dispense: 90 tablet; Refill: 1  4. Hyperlipidemia, unspecified hyperlipidemia type Chronic.  Controlled.  Stable.  Continue rosuvastatin 20 mg once a day.  Will check renal function and lipid panel. - rosuvastatin (CRESTOR) 20 MG tablet; TAKE 1 TABLET DAILY  Dispense: 90 tablet; Refill: 1 - Lipid Panel With LDL/HDL Ratio  5. Migraine with aura and without status  migrainosus, not intractable Chronic.  Episodic.  Controlled.  No headaches for 2 months.  We will refill Imitrex 50 mg to take as directed for headache. - SUMAtriptan (IMITREX) 50 MG tablet; Take 1 tablet (50 mg total) by mouth as needed. Per 90 days  Dispense: 10 tablet; Refill: 1  6. Taking one medication for chronic disease Chronic.  Controlled.  Stable.  Will recheck hepatic function panel while on statin control. - Hepatic function panel

## 2020-05-22 LAB — LIPID PANEL WITH LDL/HDL RATIO
Cholesterol, Total: 130 mg/dL (ref 100–199)
HDL: 39 mg/dL — ABNORMAL LOW (ref >39)
LDL Chol Calc (NIH): 64 mg/dL (ref 0–99)
LDL/HDL Ratio: 1.6 ratio (ref 0.0–3.2)
Triglycerides: 160 mg/dL — ABNORMAL HIGH (ref 0–149)
VLDL Cholesterol Cal: 27 mg/dL (ref 5–40)

## 2020-05-22 LAB — RENAL FUNCTION PANEL
Albumin: 4.7 g/dL (ref 3.8–4.8)
BUN/Creatinine Ratio: 18 (ref 12–28)
BUN: 14 mg/dL (ref 8–27)
CO2: 25 mmol/L (ref 20–29)
Calcium: 9.2 mg/dL (ref 8.7–10.3)
Chloride: 99 mmol/L (ref 96–106)
Creatinine, Ser: 0.79 mg/dL (ref 0.57–1.00)
GFR calc Af Amer: 88 mL/min/{1.73_m2} (ref >59)
GFR calc non Af Amer: 76 mL/min/{1.73_m2} (ref >59)
Glucose: 151 mg/dL — ABNORMAL HIGH (ref 65–99)
Phosphorus: 4 mg/dL (ref 3.0–4.3)
Potassium: 4.7 mmol/L (ref 3.5–5.2)
Sodium: 140 mmol/L (ref 134–144)

## 2020-05-22 LAB — HEPATIC FUNCTION PANEL
ALT: 17 IU/L (ref 0–32)
AST: 18 IU/L (ref 0–40)
Alkaline Phosphatase: 45 IU/L — ABNORMAL LOW (ref 48–121)
Bilirubin Total: 0.3 mg/dL (ref 0.0–1.2)
Bilirubin, Direct: 0.09 mg/dL (ref 0.00–0.40)
Total Protein: 6.9 g/dL (ref 6.0–8.5)

## 2020-07-04 DIAGNOSIS — Z01419 Encounter for gynecological examination (general) (routine) without abnormal findings: Secondary | ICD-10-CM | POA: Diagnosis not present

## 2020-07-04 DIAGNOSIS — Z6828 Body mass index (BMI) 28.0-28.9, adult: Secondary | ICD-10-CM | POA: Diagnosis not present

## 2020-07-04 DIAGNOSIS — N8189 Other female genital prolapse: Secondary | ICD-10-CM | POA: Diagnosis not present

## 2020-07-04 DIAGNOSIS — N952 Postmenopausal atrophic vaginitis: Secondary | ICD-10-CM | POA: Diagnosis not present

## 2020-07-30 DIAGNOSIS — E785 Hyperlipidemia, unspecified: Secondary | ICD-10-CM | POA: Diagnosis not present

## 2020-07-30 DIAGNOSIS — E1169 Type 2 diabetes mellitus with other specified complication: Secondary | ICD-10-CM | POA: Diagnosis not present

## 2020-07-30 DIAGNOSIS — I152 Hypertension secondary to endocrine disorders: Secondary | ICD-10-CM | POA: Diagnosis not present

## 2020-07-30 DIAGNOSIS — I2510 Atherosclerotic heart disease of native coronary artery without angina pectoris: Secondary | ICD-10-CM | POA: Diagnosis not present

## 2020-07-30 DIAGNOSIS — E1159 Type 2 diabetes mellitus with other circulatory complications: Secondary | ICD-10-CM | POA: Diagnosis not present

## 2020-09-03 ENCOUNTER — Telehealth: Payer: Self-pay

## 2020-09-03 NOTE — Chronic Care Management (AMB) (Signed)
  Chronic Care Management   Note  09/03/2020 Name: Nicole Cook MRN: [id number] DOB: [birth year]  Nicole Cook is a 69 y.o. year old female who is a primary care patient of Juline Patch, MD. I reached out to [name] by phone today in response to a referral sent by Ms. Shelda Jakes [employer]son's health plan.     Ms. Ethridge was given information about Chronic Care Management services today including:  1. CCM service includes personalized support from designated clinical staff supervised by her physician, including individualized plan of care and coordination with other care providers 2. 24/7 contact phone numbers for assistance for urgent and routine care needs. 3. Service will only be billed when office clinical staff spend 20 minutes or more in a month to coordinate care. 4. Only one practitioner may furnish and bill the service in a calendar month. 5. The patient may stop CCM services at any time (effective at the end of the month) by phone call to the office staff. 6. The patient will be responsible for cost sharing (co-pay) of up to 20% of the service fee (after annual deductible is met).  Patient agreed to services and verbal consent obtained.   Follow up plan: Telephone appointment with care management team member scheduled for:09/05/2020  Noreene Larsson, [address], [place], [place] [address] Direct Dial: [phone] [email or web address]: [email or web address]

## 2020-09-05 ENCOUNTER — Ambulatory Visit: Payer: Medicare Other | Admitting: Pharmacist

## 2020-09-05 DIAGNOSIS — E119 Type 2 diabetes mellitus without complications: Secondary | ICD-10-CM

## 2020-09-05 DIAGNOSIS — E785 Hyperlipidemia, unspecified: Secondary | ICD-10-CM

## 2020-09-05 NOTE — Patient Instructions (Addendum)
Visit Information  It was a pleasure speaking with you today! Thank you for letting me be a part of your care team. Please call with any questions or concerns.  Goals Addressed            This Visit's Progress   . PharmD [facility] (see longitudinal plan of care for additional care plan information)  Current Barriers:  . Chronic Disease Management support, education, and care coordination needs related to Hypertension, Hyperlipidemia, Diabetes, and Coronary Artery Disease   Hypertension BP Readings from Last 3 Encounters:  05/21/20 120/60  01/11/20 120/60  11/20/19 130/60   . Pharmacist Clinical Goal(s): o Over the next 90days, patient will work with PharmD and providers to maintain BP goal <130/80 . Current regimen:  . Lisinopril 10 mg daily  . Hydrochlorothiazide 25 mg daily . Metoprolol succinate 75 mg daily (takes 25 mg + 50 mg) . Amlodipine 5 mg daily . Interventions: . Comprehensive medication review performed, medication list updated in electronic medical record . Inter-disciplinary care team collaboration (see longitudinal plan of care) o Discussed  dosage delivery design of Toprol XL allows tablets to be halved. PharmD will collaborate with PCP if 50 mg 1.5 tablet RX will save patient on copays . Patient self care activities - Over the next 90 days, patient will: o Check BP weekly document, and provide at future appointments o Ensure daily salt intake < 2300 mg/day o Contact insurance regarding copay savings of Metoprolol succinate 50 mg 1.5 tabs daily o Contact PharmD for current tablet verification.  Hyperlipidemia/CAD Lab Results  Component Value Date/Time   LDLCALC 64 05/21/2020 09:53 AM   . Pharmacist Clinical Goal(s): o Over the next 90 days, patient will work with PharmD and providers to maintain LDL goal < 70 . Current regimen:  . Rosuvastatin 20 mg . Aspirin 81 mg qd . Nitrostat 0.4 mg prn  . Interventions: . Comprehensive  medication review performed, medication list updated in electronic medical record . Inter-disciplinary care team collaboration (see longitudinal plan of care)  . Patient self care activities - Over the next 90 days, patient will: o Continue exercise regimen aiming for 30 minutes/day 5 days/week. o Continue following DASH diet.  Diabetes Lab Results  Component Value Date/Time   HGBA1C 6.8 05/03/2020 12:00 AM   HGBA1C 6.4 11/14/2019 12:00 AM   . Pharmacist Clinical Goal(s): o Over the next 90 days, patient will work with PharmD and providers to maintain A1c goal <7% . Current regimen:  . Trulicity 4.28 mg weekly on Sunday . Metformin 1000 mg bid . Insulin lispro 2-8 units before meals prn . Interventions: . Comprehensive medication review performed, medication list updated in electronic medical record . Inter-disciplinary care team collaboration (see longitudinal plan of care) . Will initiate patient assistance for Trulicity . Patient self care activities - Over the next 90 days, patient will: o Check blood sugar once daily, document, and provide at future appointments o Contact provider with any episodes of hypoglycemia o Provide requested portion of PAP   Postmenopausal Vaginal Atrophy Current Regimen: Estring vaginal ring every 3 weeks . Interventions: o Will research available patient assistance options and initiate process for copay assistance . Patient self care activities - Over the next 90 days, patient will: o Provide requested portion of PAP application  Medication management . Pharmacist Clinical Goal(s): o Over the next 90 days, patient will work with PharmD and providers to maintain optimal medication adherence .  [facility] . Interventions o Comprehensive medication review performed. o Continue current medication management strategy . Patient self care activities - Over the next 90 days, patient will o Take medications as  prescribed o Report any questions or concerns to PharmD and/or provider(s)  Initial goal documentation        Ms. Schuessler was given information about Chronic Care Management services today including:  1. CCM service includes personalized support from designated clinical staff supervised by her physician, including individualized plan of care and coordination with other care providers 2. 24/7 contact phone numbers for assistance for urgent and routine care needs. 3. Standard insurance, coinsurance, copays and deductibles apply for chronic care management only during months in which we provide at least 20 minutes of these services. Most insurances cover these services at 100%, however patients may be responsible for any copay, coinsurance and/or deductible if applicable. This service may help you avoid the need for more expensive face-to-face services. 4. Only one practitioner may furnish and bill the service in a calendar month. 5. The patient may stop CCM services at any time (effective at the end of the month) by phone call to the office staff.  Patient agreed to services and verbal consent obtained.   The patient verbalized understanding of instructions provided today and agreed to receive a mailed copy of patient instruction and/or educational materials. Telephone follow up appointment with pharmacy team member scheduled for: 3 months  Junita Push. Kenton Kingfisher PharmD, BCPS Clinical Pharmacist [phone]  Mindfulness-Based Stress Reduction Mindfulness-based stress reduction (MBSR) is a program that helps people learn to practice mindfulness. Mindfulness is the practice of intentionally paying attention to the present moment. It can be learned and practiced through techniques such as education, breathing exercises, meditation, and yoga. MBSR includes several mindfulness techniques in one program. MBSR works best when you understand the treatment, are willing to try new things, and can commit to  spending time practicing what you learn. MBSR training may include learning about:  How your emotions, thoughts, and reactions affect your body.  New ways to respond to things that cause negative thoughts to start (triggers).  How to notice your thoughts and let go of them.  Practicing awareness of everyday things that you normally do without thinking.  The techniques and goals of different types of meditation. What are the benefits of MBSR? MBSR can have many benefits, which include helping you to:  Develop self-awareness. This refers to knowing and understanding yourself.  Learn skills and attitudes that help you to participate in your own health care.  Learn new ways to care for yourself.  Be more accepting about how things are, and let things go.  Be less judgmental and approach things with an open mind.  Be patient with yourself and trust yourself more. MBSR has also been shown to:  Reduce negative emotions, such as depression and anxiety.  Improve memory and focus.  Change how you sense and approach pain.  Boost your body's ability to fight infections.  Help you connect better with other people.  Improve your sense of well-being. Follow these instructions at home:   Find a local in-person or online MBSR program.  Set aside some time regularly for mindfulness practice.  Find a mindfulness practice that works best for you. This may include one or more of the following: ? Meditation. Meditation involves focusing your mind on a certain thought or activity. ? Breathing awareness exercises. These help you to stay present by focusing  on your breath. ? Body scan. For this practice, you lie down and pay attention to each part of your body from head to toe. You can identify tension and soreness and intentionally relax parts of your body. ? Yoga. Yoga involves stretching and breathing, and it can improve your ability to move and be flexible. It can also provide an  experience of testing your body's limits, which can help you release stress. ? Mindful eating. This way of eating involves focusing on the taste, texture, color, and smell of each bite of food. Because this slows down eating and helps you feel full sooner, it can be an important part of a weight-loss plan.  Find a podcast or recording that provides guidance for breathing awareness, body scan, or meditation exercises. You can listen to these any time when you have a free moment to rest without distractions.  Follow your treatment plan as told by your health care provider. This may include taking regular medicines and making changes to your diet or lifestyle as recommended. How to practice mindfulness To do a basic awareness exercise:  Find a comfortable place to sit.  Pay attention to the present moment. Observe your thoughts, feelings, and surroundings just as they are.  Avoid placing judgment on yourself, your feelings, or your surroundings. Make note of any judgment that comes up, and let it go.  Your mind may wander, and that is okay. Make note of when your thoughts drift, and return your attention to the present moment. To do basic mindfulness meditation:  Find a comfortable place to sit. This may include a stable chair or a firm floor cushion. ? Sit upright with your back straight. Let your arms fall next to your side with your hands resting on your legs. ? If sitting in a chair, rest your feet flat on the floor. ? If sitting on a cushion, cross your legs in front of you.  Keep your head in a neutral position with your chin dropped slightly. Relax your jaw and rest the tip of your tongue on the roof of your mouth. Drop your gaze to the floor. You can close your eyes if you like.  Breathe normally and pay attention to your breath. Feel the air moving in and out of your nose. Feel your belly expanding and relaxing with each breath.  Your mind may wander, and that is okay. Make note of  when your thoughts drift, and return your attention to your breath.  Avoid placing judgment on yourself, your feelings, or your surroundings. Make note of any judgment or feelings that come up, let them go, and bring your attention back to your breath.  When you are ready, lift your gaze or open your eyes. Pay attention to how your body feels after the meditation. Where to find more information You can find more information about MBSR from:  Your health care provider.  Community-based meditation centers or programs.  Programs offered near you. Summary  Mindfulness-based stress reduction (MBSR) is a program that teaches you how to intentionally pay attention to the present moment. It is used with other treatments to help you cope better with daily stress, emotions, and pain.  MBSR focuses on developing self-awareness, which allows you to respond to life stress without judgment or negative emotions.  MBSR programs may involve learning different mindfulness practices, such as breathing exercises, meditation, yoga, body scan, or mindful eating. Find a mindfulness practice that works best for you, and set aside time for it  on a regular basis. This information is not intended to replace advice given to you by your health care provider. Make sure you discuss any questions you have with your health care provider. Document Revised: 11/12/2017 Document Reviewed: 04/08/2017 Elsevier Patient Education  [place].  Diabetes Mellitus and Sick Day Management Blood sugar (glucose) can be difficult to control when you are sick. Common illnesses that can cause problems for people with diabetes (diabetes mellitus) include colds, fever, flu (influenza), nausea, vomiting, and diarrhea. These illnesses can cause stress and loss of body fluids (dehydration), and those issues can cause blood glucose levels to increase. Because of this, it is very important to take your insulin and diabetes medicines and  eat some form of carbohydrate when you are sick. You should make a plan for days when you are sick (sick day plan) as part of your diabetes management plan. You and your health care provider should make this plan in advance. The following guidelines are intended to help you manage an illness that lasts for about 24 hours or less. Your health care provider may also give you more specific instructions. What do I need to do to manage my blood glucose?   Check your blood glucose every 2-4 hours, or as often as told by your health care provider.  Know your sick day treatment goals. Your target blood glucose levels may be different when you are sick.  If you use insulin, take your usual dose. ? If your blood glucose continues to be too high, you may need to take an additional insulin dose as told by your health care provider.  If you use oral diabetes medicine, you may need to stop taking it if you are not able to eat or drink normally. Ask your health care provider about whether you need to stop taking these medicines while you are sick.  If you use injectable hormone medicines other than insulin to control your diabetes, ask your health care provider about whether you need to stop taking these medicines while you are sick. What else can I do to manage my diabetes when I am sick? Check your ketones  If you have type 1 diabetes, check your urine ketones every 4 hours.  If you have type 2 diabetes, check your urine ketones as often as told by your health care provider. Drink fluids  Drink enough fluid to keep your urine clear or pale yellow. This is especially important if you have a fever, vomiting, or diarrhea. Those symptoms can lead to dehydration.  Follow any instructions from your health care provider about beverages to avoid. ? Do not drink alcohol, caffeine, or drinks that contain a lot of sugar. Take medicines as directed  Take-over-the-counter and prescription medicines only as told  by your health care provider.  Check medicine labels for added sugars. Some medicines may contain sugar or types of sugars that can raise your blood glucose level. What foods can I eat when I am sick?  You need to eat some form of carbohydrates when you are sick. You should eat 45-50 grams (45-50 g) of carbohydrates every 3-4 hours until you feel better. All of the food choices below contain about 15 g of carbohydrates. Plan ahead and keep some of these foods around so you have them if you get sick.  4-6 oz (120-177 mL) carbonated beverage that contains sugar, such as regular (not diet) soda. You may be able to drink carbonated beverages more easily if you open the  beverage and let it sit at room temperature for a few minutes before drinking.   of a twin frozen ice pop.  4 oz (120 g) regular gelatin.  4 oz (120 mL) fruit juice.  4 oz (120 g) ice cream or frozen yogurt.  2 oz (60 g) sherbet.  8 oz (240 mL) clear broth or soup.  4 oz (120 g) regular custard.  4 oz (120 g) regular pudding.  8 oz (240 g) plain yogurt.  1 slice bread or toast.  6 saltine crackers.  5 vanilla wafers. Questions to ask your health care provider Consider asking the following questions so you know what to do on days when you are sick:  Should I adjust my diabetes medicines?  How often do I need to check my blood glucose?  What supplies do I need to manage my diabetes at home when I am sick?  What number can I call if I have questions?  What foods and drinks should I avoid? Contact a health care provider if:  You develop symptoms of diabetic ketoacidosis, such as: ? Fatigue. ? Weight loss. ? Excessive thirst. ? Light-headedness. ? Fruity or sweet-smelling breath. ? Excessive urination. ? Vision changes. ? Confusion or irritability. ? Nausea. ? Vomiting. ? Rapid breathing. ? Pain in the abdomen. ? Feeling flushed.  You are unable to drink fluids without vomiting.  You have any of  the following for more than 6 hours: ? Nausea. ? Vomiting. ? Diarrhea.  Your blood glucose is at or above 240 mg/dL (13.3 mmol/L), even after you take an additional insulin dose.  You have a change in how you think, feel, or act (mental status).  You develop another serious illness.  You have been sick or have had a fever for 2 days or longer and you are not getting better. Get help right away if:  Your blood glucose is lower than 54 mg/dL (3.0 mmol/L).  You have difficulty breathing.  You have moderate or high ketone levels in your urine.  You used emergency glucagon to treat low blood glucose. Summary  Blood sugar (glucose) can be difficult to control when you are sick. Common illnesses that can cause problems for people with diabetes (diabetes mellitus) include colds, fever, flu (influenza), nausea, vomiting, and diarrhea.  Illnesses can cause stress and loss of body fluids (dehydration), and those issues can cause blood glucose levels to increase.  Make a plan for days when you are sick (sick day plan) as part of your diabetes management plan. You and your health care provider should make this plan in advance.  It is very important to take your insulin and diabetes medicines and to eat some form of carbohydrate when you are sick.  Contact your health care provider if have problems managing your blood glucose levels when you are sick, or if you have been sick or had a fever for 2 days or longer and are not getting better. This information is not intended to replace advice given to you by your health care provider. Make sure you discuss any questions you have with your health care provider. Document Revised: 08/28/2016 Document Reviewed: 08/28/2016 Elsevier Patient Education  [place].

## 2020-09-05 NOTE — Progress Notes (Addendum)
Chronic Care Management Pharmacy  Name: Nicole Cook  MRN: [id number] DOB: 16-Sep-1951   Chief Complaint/ HPI  Nicole Cook,  69 y.o. , female presents for their Initial CCM visit with the clinical pharmacist via telephone due to COVID-19 Pandemic.  PCP : Juline Patch, MD Patient Care Team: Juline Patch, MD as PCP - General (Family Medicine) Vergia Alcon, MD as Consulting Physician Lindalou Hose Melvyn Novas, MD as Consulting Physician (Occupational Medicine) Enos Fling, MD as Consulting Physician (Obstetrics and Gynecology) Rolena Infante, MD as Consulting Physician (Physical Medicine and Rehabilitation) Elease Etienne, MD as Consulting Physician (Internal Medicine) Vladimir Faster, [facility] (Pharmacist)  Their chronic conditions include: Hypertension, Hyperlipidemia, Diabetes, Coronary Artery Disease, GERD, Osteoarthritis, Allergic Rhinitis and Chronic pain   Office Visits: 05/21/20- Dr. Ronnald Ramp- bloodwork, Toprol xl dose decreased to 50 mg  Consult Visit: 07/30/20-Dr. Chryl Heck, Cardiology - annual follow up  Allergies  Allergen Reactions  . Aspartame Other (See Comments)  . Compazine [Prochlorperazine]   . Penicillins   . Sulfa Antibiotics   . Sulfamethoxazole-Trimethoprim Hives and Other (See Comments)    Other Reaction: Not Assessed     Medications: Outpatient Encounter Medications as of 09/05/2020  Medication Sig Note  . amLODipine (NORVASC) 5 MG tablet Take 1 tablet (5 mg total) by mouth daily.   Marland Kitchen aspirin 81 MG tablet Take 1 tablet by mouth daily. 04/03/2015: Received from: [place]:   . cetirizine (ZYRTEC) 10 MG tablet Take 10 mg by mouth daily. otc   . Cholecalciferol (VITAMIN D3) 1000 units CAPS Take 1 capsule by mouth daily.   . Coenzyme Q10 (EQL COQ10) 300 MG CAPS Take 1 capsule by mouth daily.   . Dulaglutide (TRULICITY) 9.40 HW/8.0SU SOPN Inject 1 each into the skin once a week. Dr Eddie Dibbles  .Marland KitchenSunday   . estradiol (ESTRING)  2 MG vaginal ring Place 2 mg vaginally every 3 (three) months. follow package directions- GYN   . fluticasone (FLONASE) 50 MCG/ACT nasal spray One spray each nostril daily (Patient taking differently: 2 sprays at bedtime. One spray each nostril daily)   . gabapentin (NEURONTIN) 300 MG capsule Take 2 capsules by mouth 3 (three) times daily. [facility]- may take 3 capsules 3 times per day per pt 04/03/2015: [facility]/ Received from: [place]:   . hydrochlorothiazide (HYDRODIURIL) 25 MG tablet Take 1 tablet (25 mg total) by mouth every morning.   Marland Kitchen HYDROcodone-acetaminophen (NORCO) 10-325 MG per tablet Take 1 tablet by mouth as needed. [facility] Gets 40 tabs monthly 04/03/2015: Medication taken as needed. [facility] Received from: [place]:   . insulin lispro (HUMALOG KWIKPEN) 100 UNIT/ML KwikPen Humalog KwikPen (U-100) Insulin 100 unit/mL subcutaneous  INJECT 2-8 UNITS BEFORE MEALS AS DIRECTED   . lidocaine (LIDODERM) 5 % lidocaine 5 % topical patch  APPLY 1 PATCH DAILY (12 HOURS ON AND 12 HOURS OFF)   . lisinopril (ZESTRIL) 10 MG tablet Take 1 tablet (10 mg total) by mouth daily.   . Magnesium 500 MG CAPS Take 1 capsule by mouth daily.   . metFORMIN (GLUCOPHAGE) 1000 MG tablet 1 tablet 2 (two) times daily.   . metoprolol succinate (TOPROL-XL) 25 MG 24 hr tablet Take 1 tablet (25 mg total) by mouth daily. Cardio- takes with 30m= 75   . metoprolol succinate (TOPROL-XL) 50 MG 24 hr tablet Take with or immediately following a meal.   . montelukast (SINGULAIR) 10 MG  tablet Take 1 tablet (10 mg total) by mouth daily. (Patient taking differently: Take 10 mg by mouth at bedtime. )   . Multiple Vitamins-Minerals (CENTRUM SILVER ULTRA WOMENS PO) Take 1 tablet by mouth daily. 04/03/2015: Received from: [place]:   . nitroGLYCERIN (NITROSTAT) 0.4 MG SL tablet Place 1 tablet under the tongue as needed. 04/03/2015:  Medication taken as needed.  Received from: [place]:   . olopatadine (PATANOL) 0.1 % ophthalmic solution Place 1 drop into both eyes 2 (two) times daily.   . ONE TOUCH ULTRA TEST test strip    . OXYCONTIN 10 MG 12 hr tablet Take 10 mg by mouth 2 (two) times daily. Pain Dr   . pantoprazole (PROTONIX) 40 MG tablet Take 1 tablet (40 mg total) by mouth daily.   . rosuvastatin (CRESTOR) 20 MG tablet TAKE 1 TABLET DAILY   . SUMAtriptan (IMITREX) 50 MG tablet Take 1 tablet (50 mg total) by mouth as needed. Per 90 days 09/05/2020: Takes about every 3 months  . tiZANidine (ZANAFLEX) 4 MG tablet tizanidine 4 mg tablet  TAKE 1-2 TABLETS BY MOUTH AT NIGHT AT BEDTIME   . vitamin B-12 (CYANOCOBALAMIN) 1000 MCG tablet Take 1,000 mcg by mouth daily.   Marland Kitchen ACCU-CHEK FASTCLIX LANCETS MISC Accu-Chek [employer]   . Lido-Capsaicin-Men-Methyl Sal 0.5-0.035-5-20 % PTCH Apply 1 application topically as needed. [facility] (Patient not taking: Reported on 09/05/2020) 04/03/2015: Medication taken as needed. [facility] Received from: [place]: lidocaine (bulk) Misc - Historical Medication  patches as needed Active Comments: Medication taken as needed. [facility]   No facility-administered encounter medications on file as of 09/05/2020.    Wt Readings from Last 3 Encounters:  05/21/20 166 lb (75.3 kg)  01/11/20 158 lb (71.7 kg)  12/25/19 160 lb (72.6 kg)    Current Diagnosis/Assessment:    Goals Addressed            This Visit's Progress   . PharmD [place] (see longitudinal plan of care for additional care plan information)  Current Barriers:  . Chronic Disease Management support, education, and care coordination needs related to Hypertension, Hyperlipidemia, Diabetes, and Coronary Artery Disease   Hypertension BP Readings from Last 3 Encounters:  05/21/20 120/60  01/11/20 120/60  11/20/19 130/60   . Pharmacist  Clinical Goal(s): o Over the next 90days, patient will work with PharmD and providers to maintain BP goal <130/80 . Current regimen:  . Lisinopril 10 mg daily  . Hydrochlorothiazide 25 mg daily . Metoprolol succinate 75 mg daily (takes 25 mg + 50 mg) . Amlodipine 5 mg daily . Interventions: . Comprehensive medication review performed, medication list updated in electronic medical record . Inter-disciplinary care team collaboration (see longitudinal plan of care) o Discussed  dosage delivery design of Toprol XL allows tablets to be halved. PharmD will collaborate with PCP if 50 mg 1.5 tablet RX will save patient on copays . Patient self care activities - Over the next 90 days, patient will: o Check BP weekly document, and provide at future appointments o Ensure daily salt intake < 2300 mg/day o Contact insurance regarding copay savings of Metoprolol succinate 50 mg 1.5 tabs daily o Contact PharmD for current tablet verification.  Hyperlipidemia/CAD Lab Results  Component Value Date/Time   LDLCALC 64 05/21/2020 09:53 AM   . Pharmacist Clinical Goal(s): o Over the next 90 days, patient will  work with PharmD and providers to maintain LDL goal < 70 . Current regimen:  . Rosuvastatin 20 mg . Aspirin 81 mg qd . Nitrostat 0.4 mg prn  . Interventions: . Comprehensive medication review performed, medication list updated in electronic medical record . Inter-disciplinary care team collaboration (see longitudinal plan of care)  . Patient self care activities - Over the next 90 days, patient will: o Continue exercise regimen aiming for 30 minutes/day 5 days/week. o Continue following DASH diet.  Diabetes Lab Results  Component Value Date/Time   HGBA1C 6.8 05/03/2020 12:00 AM   HGBA1C 6.4 11/14/2019 12:00 AM   . Pharmacist Clinical Goal(s): o Over the next 90 days, patient will work with PharmD and providers to maintain A1c goal <7% . Current regimen:  . Trulicity 7.02 mg weekly on  Sunday . Metformin 1000 mg bid . Insulin lispro 2-8 units before meals prn . Interventions: . Comprehensive medication review performed, medication list updated in electronic medical record . Inter-disciplinary care team collaboration (see longitudinal plan of care) . Will initiate patient assistance for Trulicity . Patient self care activities - Over the next 90 days, patient will: o Check blood sugar once daily, document, and provide at future appointments o Contact provider with any episodes of hypoglycemia o Provide requested portion of PAP   Postmenopausal Vaginal Atrophy Current Regimen: Estring vaginal ring every 3 weeks . Interventions: o Will research available patient assistance options and initiate process for copay assistance . Patient self care activities - Over the next 90 days, patient will: o Provide requested portion of PAP application  Medication management . Pharmacist Clinical Goal(s): o Over the next 90 days, patient will work with PharmD and providers to maintain optimal medication adherence . [facility] . Interventions o Comprehensive medication review performed. o Continue current medication management strategy . Patient self care activities - Over the next 90 days, patient will o Take medications as prescribed o Report any questions or concerns to PharmD and/or provider(s)  Initial goal documentation          Diabetes   A1c goal <7%  Recent Relevant Labs: Lab Results  Component Value Date/Time   HGBA1C 6.8 05/03/2020 12:00 AM   HGBA1C 6.4 11/14/2019 12:00 AM   MICROALBUR 42.5 05/03/2020 12:00 AM    Last diabetic Eye exam:  Lab Results  Component Value Date/Time   HMDIABEYEEXA No Retinopathy 08/15/2019 12:00 AM    Last diabetic Foot exam:  Lab Results  Component Value Date/Time   HMDIABFOOTEX normal 05/03/2020 12:00 AM     Checking BG: Daily FBG occasionally checks 2hr PP  Recent FBG Readings: 120s    Recent 2hr PP BG readings:  140s   Patient has failed these meds in past: Invokana, Glipizide Patient is currently controlled on the following medications: . Trulicity 6.37 mg weely on Sunday . Metformin 1000 mg bid . Insulin lispro 2-8 units before meals prn (uses when she gets steroid injections for pain)  We discussed: Diet and exercise. Nicole Cook reports evening  appetite greatly reduced with Trulicity. She typically has a bigger breakfast, a protein rich afternoon snack and a light dinner. She prepares meals at home and avoids starchy and sugary foods. She walks for exercise 30 minutes/day weather permitting since her treadmill stopped working. She also has a Building services engineer and utilizes the pool in the winter.  She is a retired Marine scientist and very knowledgeable regarding her medication regimen, BG monitoring and goals of therapy. She denies  any episodes of hypoglycemia since her initial diagnose and is familiar with the symptoms.  Reports Trulicity copayment is high. Will ask CPA to follow up with patient regarding PAP income limits and initiate paperwork if eligible.  Plan  Continue current medications   Hypertension   BP goal is:  <130/80  Office blood pressures are  BP Readings from Last 3 Encounters:  05/21/20 120/60  01/11/20 120/60  11/20/19 130/60   BMP Latest Ref Rng & Units 05/21/2020 11/20/2019 05/24/2019  Glucose 65 - 99 mg/dL 151(H) 145(H) 143(H)  BUN 8 - 27 mg/dL _0 Creatinine 0.57 - 1.00 mg/dL 0.79 0.70 0.65  BUN/Creat Ratio 12 - _1 Sodium 134 - 144 mmol/L 140 140 140  Potassium 3.5 - 5.2 mmol/L 4.7 4.4 4.6  Chloride 96 - 106 mmol/L 99 98 97  CO2 20 - 29 mmol/L _2 Calcium 8.7 - 10.3 mg/dL 9.2 10.1 10.1    Patient checks BP at home infrequently  Patient home BP readings are ranging: <130/80  Patient has failed these meds in the past: N/A Patient is currently controlled on the following medications:  . Lisinopril 10 mg  qd . Hydrochlorothiazide 25 mg qd . Metoprolol succinate 75 mg qd (takes 25 mg + 50 mg) . Amlodipine 5 mg qd  We discussed : Patient states she was told by a pharmacist not to crush or split extended release tablets and that is why she takes two strengths of metoprolol succinate. I educated her that Toprol XL and all available generic metoprolol succinate dosage forms are designed to allow tablet splitting with ER mechanisms in each half. She will contact her insurance company to inquire if taking 1.5 tablets will save her on copays. She will also bring her tablets in for me to positively identify. She checks her BP at least monthly to ensure adequate control. He eats a low sodium diet.  Plan  Continue current medications   Hyperlipidemia/CAD   LDL goal < 70  Lipid Panel     Component Value Date/Time   CHOL 130 05/21/2020 0953   TRIG 160 (H) 05/21/2020 0953   HDL 39 (L) 05/21/2020 0953   LDLCALC 64 05/21/2020 0953    Hepatic Function Latest Ref Rng & Units 05/21/2020 11/20/2019 05/24/2019  Total Protein 6.0 - 8.5 g/dL 6.9 - 7.2  Albumin 3.8 - 4.8 g/dL 4.7 4.5 4.9(H)  AST 0 - 40 IU/L _3 ALT 0 - 32 IU/L _4 Alk Phosphatase 48 - 121 IU/L 45(L) 46 39  Total Bilirubin 0.0 - 1.2 mg/dL 0.3 0.3 0.4  Bilirubin, Direct 0.00 - 0.40 mg/dL 0.09 0.10 0.13     The ASCVD Risk score (Pepin., et al., 2013) failed to calculate for the following reasons:   The patient has a prior MI or stroke diagnosis   History of MI with two stents placed in 2011  Patient has failed these meds in past: atorvastatin Patient is currently controlled on the following medications:  . Rosuvastatin 20 mg . Aspirin 81 mg qd . Nitrostat 0.4 mg prn  We discussed: Diet and exercise extensively.  She is followed by cardiology.   Plan  Continue current medications  GERD   Patient has failed these meds in past: N/A Patient is currently controlled on the following medications:  . Pantoprazole 40 mg  qd  We discussed:  Nonpharmacologic management including avoiding trigger foods and eating within three  hours of bedtime.  Plan  Continue current medications  Osteoarthritis/Chronic Pain(lumbosacral neuritis or radiculitis)   Patient has failed these meds in past: N/A Patient is currently controlled on the following medications:  . Oxycontin 10 mg bid . Hydrocodone/APAP 10/325 mg 1-1.5 tabs nightly (40 tabs monthly) . lidoderm 5% patch daily prn . Gabapentin 600-900 mg tid (rarely takes 955m except occasionally at bedtime) . Tizanidine 4-8 mg qhs prn  We discussed:  Patient is followed by pain management. Occasionally gets steroid injections for back pain. She utilizes water therapy and maintains a healthy weight. She is mostly controlled on this regimen and able to enjoy her six grandchildren and help care for her elderly mother.  Plan  Continue current medications   Health maintenance     Patient is currently on the following medications:  . Estring vaginal ring q 3 months . Cyanocobalamin 1000 mcg qd . Sumatriptan 50 mg prn migraine  . Centrum silver womens MVI qd . Magnesium 5020mqd . Cholecalciferol 1000 units qd   We discussed:  Endocrinology checks B12 levels. One migraine ~ every three months. Patient states calcium was stopped by cardiology. Last DEXA scan  Feb 2021 Normal. Reports Estring is very expensive. Will ask CPA to investigate PAP options and initiate process.  Plan  Continue current medications   Vaccines   Reviewed and discussed patient's vaccination history.    Immunization History  Administered Date(s) Administered  . Fluad Quad(high Dose 65+) 10/02/2019  . Hepatitis B 12/14/1998  . Influenza, High Dose Seasonal PF 09/06/2017, 09/26/2018  . Influenza,inj,Quad PF,6+ Mos 09/02/2015, 09/02/2016  . Influenza-Unspecified 09/13/2012, 08/14/2013, 01/10/2015, 02/10/2015, 08/15/2015, 09/02/2016, 08/14/2017  . MMR 05/14/1981  . PFIZER SARS-COV-2  Vaccination 01/24/2020, 02/14/2020  . Pneumococcal Polysaccharide-23 08/29/2014  . Pneumococcal-Unspecified 05/14/2009, 08/14/2014  . Td 12/14/2005  . Tdap 01/14/2007, 02/12/2016, 03/02/2017  . Zoster 01/14/2015, 02/10/2015    Plan  Recommended patient receive flu vaccine in the office.  Patient will call [facility] schedule.  Allergic Rhinitis   Patient has failed these meds in past: N/A Patient is currently controlled on the following medications:  . Cetirizine 10 mg qd . Olopatadine ophthalmic drops 1 drop OU bid . Montelukast 10 mg qd . Fluticasone Nasal spray 2 sprays EN nightly  We discussed:  Patient reports much improvement in symptoms since Dr. JoRonnald Rampecommended she take cetirizine every day instead of prn.   Plan  Continue current medications    Medication Management   [facility]  Pt endorses compliance  We discussed: Discussed benefits of medication synchronization, packaging and delivery as well as enhanced pharmacist oversight with [employer].  Plan  Continue current medication management strategy    Follow up: 3 month phone visit  JuJunita PushHaKenton KingfisherharmD, BCBuncombeamily Practice [phone]

## 2020-10-07 ENCOUNTER — Telehealth: Payer: Self-pay | Admitting: Pharmacist

## 2020-10-07 NOTE — Chronic Care Management (AMB) (Signed)
Chronic Care Management Pharmacy Assistant   Name: Nicole Cook  MRN: [id number] DOB: May 13, 1951  Reason for Encounter: Medication Patient Assistance for Trulicity and Estring.  Patient Questions:  1.  Have you seen any other providers since your last visit? No  2.  Any changes in your medicines or health? No     PCP : Juline Patch, MD  Allergies:   Allergies  Allergen Reactions  . Aspartame Other (See Comments)  . Compazine [Prochlorperazine]   . Penicillins   . Sulfa Antibiotics   . Sulfamethoxazole-Trimethoprim Hives and Other (See Comments)    Other Reaction: Not Assessed     Medications: Outpatient Encounter Medications as of 10/07/2020  Medication Sig Note  . ACCU-CHEK FASTCLIX LANCETS MISC Accu-Chek [employer]   . amLODipine (NORVASC) 5 MG tablet Take 1 tablet (5 mg total) by mouth daily.   Marland Kitchen aspirin 81 MG tablet Take 1 tablet by mouth daily. 04/03/2015: Received from: [place]:   . cetirizine (ZYRTEC) 10 MG tablet Take 10 mg by mouth daily. otc   . Cholecalciferol (VITAMIN D3) 1000 units CAPS Take 1 capsule by mouth daily.   . Coenzyme Q10 (EQL COQ10) 300 MG CAPS Take 1 capsule by mouth daily.   . Dulaglutide (TRULICITY) 3.90 ZE/0.9QZ SOPN Inject 1 each into the skin once a week. Dr Eddie Dibbles  .Marland KitchenSunday   . estradiol (ESTRING) 2 MG vaginal ring Place 2 mg vaginally every 3 (three) months. follow package directions- GYN   . fluticasone (FLONASE) 50 MCG/ACT nasal spray One spray each nostril daily (Patient taking differently: 2 sprays at bedtime. One spray each nostril daily)   . gabapentin (NEURONTIN) 300 MG capsule Take 2 capsules by mouth 3 (three) times daily. [facility]- may take 3 capsules 3 times per day per pt 04/03/2015: [facility]/ Received from: [place]:   . hydrochlorothiazide (HYDRODIURIL) 25 MG tablet Take 1 tablet (25 mg total) by mouth every morning.   Marland Kitchen  HYDROcodone-acetaminophen (NORCO) 10-325 MG per tablet Take 1 tablet by mouth as needed. [facility] Gets 40 tabs monthly 04/03/2015: Medication taken as needed. [facility] Received from: [place]:   . insulin lispro (HUMALOG KWIKPEN) 100 UNIT/ML KwikPen Humalog KwikPen (U-100) Insulin 100 unit/mL subcutaneous  INJECT 2-8 UNITS BEFORE MEALS AS DIRECTED   . Lido-Capsaicin-Men-Methyl Sal 0.5-0.035-5-20 % PTCH Apply 1 application topically as needed. [facility] (Patient not taking: Reported on 09/05/2020) 04/03/2015: Medication taken as needed. [facility] Received from: [place]: lidocaine (bulk) Misc - Historical Medication  patches as needed Active Comments: Medication taken as needed. [facility]  . lidocaine (LIDODERM) 5 % lidocaine 5 % topical patch  APPLY 1 PATCH DAILY (12 HOURS ON AND 12 HOURS OFF)   . lisinopril (ZESTRIL) 10 MG tablet Take 1 tablet (10 mg total) by mouth daily.   . Magnesium 500 MG CAPS Take 1 capsule by mouth daily.   . metFORMIN (GLUCOPHAGE) 1000 MG tablet 1 tablet 2 (two) times daily.   . metoprolol succinate (TOPROL-XL) 25 MG 24 hr tablet Take 1 tablet (25 mg total) by mouth daily. Cardio- takes with 50mg = 75   . metoprolol succinate (TOPROL-XL) 50 MG 24 hr tablet Take with or immediately following a meal.   . montelukast (SINGULAIR) 10 MG tablet Take 1 tablet (10 mg total) by mouth daily. (Patient taking differently: Take 10 mg by mouth at bedtime. )   .  Multiple Vitamins-Minerals (CENTRUM SILVER ULTRA WOMENS PO) Take 1 tablet by mouth daily. 04/03/2015: Received from: [place]:   . nitroGLYCERIN (NITROSTAT) 0.4 MG SL tablet Place 1 tablet under the tongue as needed. 04/03/2015: Medication taken as needed.  Received from: [place]:   . olopatadine (PATANOL) 0.1 % ophthalmic solution Place 1 drop into both eyes 2 (two) times daily.   . ONE TOUCH ULTRA TEST  test strip    . OXYCONTIN 10 MG 12 hr tablet Take 10 mg by mouth 2 (two) times daily. Pain Dr   . pantoprazole (PROTONIX) 40 MG tablet Take 1 tablet (40 mg total) by mouth daily.   . rosuvastatin (CRESTOR) 20 MG tablet TAKE 1 TABLET DAILY   . SUMAtriptan (IMITREX) 50 MG tablet Take 1 tablet (50 mg total) by mouth as needed. Per 90 days 09/05/2020: Takes about every 3 months  . tiZANidine (ZANAFLEX) 4 MG tablet tizanidine 4 mg tablet  TAKE 1-2 TABLETS BY MOUTH AT NIGHT AT BEDTIME   . vitamin B-12 (CYANOCOBALAMIN) 1000 MCG tablet Take 1,000 mcg by mouth daily.    No facility-administered encounter medications on file as of 10/07/2020.    Current Diagnosis: Patient Active Problem List   Diagnosis Date Noted  . Hyperlipidemia associated with type 2 diabetes mellitus ([place]) 12/25/2019  . Atrophic vaginitis 11/23/2018  . Pelvic floor relaxation 11/23/2018  . Primary osteoarthritis of right knee 05/12/2018  . Sacral back pain 08/04/2017  . Taking medication for chronic disease 03/02/2017  . Seasonal allergic rhinitis due to pollen 03/02/2017  . Gastroesophageal reflux disease 03/02/2017  . Migraine with aura and without status migrainosus, not intractable 03/02/2017  . Angina pectoris ([place]) 11/09/2016  . Chronic right-sided low back pain without sciatica 10/29/2016  . Trochanteric bursitis of right hip 07/31/2015  . Primary osteoarthritis of right hip 07/31/2015  . Type 2 diabetes mellitus with hyperglycemia, with long-term current use of insulin ([place]) 04/03/2015  . Hyperlipidemia 04/03/2015  . Laboratory animal allergy 04/03/2015  . Cephalalgia 04/03/2015  . Essential hypertension 04/03/2015  . Blood glucose elevated 04/03/2015  . Routine general medical examination at a health care facility 04/03/2015  . CAD (coronary artery disease) 04/03/2015  . Dermatochalasis of both upper eyelids 09/20/2014  . Sleep apnea 09/03/2014  . DDD (degenerative disc disease), lumbosacral 03/03/2012  .  Spinal stenosis of lumbar region without neurogenic claudication 03/03/2012  . Thoracic or lumbosacral neuritis or radiculitis, unspecified 03/03/2012    Goals Addressed   None    10/07/2020: Spoke to patient to discuss her fianaicial barriers related to Trulicity and Estring medications. Per patient both medications are extremely "high" in costs. She requested any assistance that could be provided to her so these medications could be more affordable to her. Explained that I would look into options for patient assistance for the two requested medications. Expressed to patient that I would call back later in the week to discuss her options and further instructions if need be. Patient verbalized understanding to all information given.   10/10/2020: Patient does qualify for patient assistance programs for Trulicity and Estring medications. Attempted to contact patient to make her aware of these findings, but no answer. Left a HIPPA compliant voicemail requesting a call back. Patient assistance applications have been started for Trulicity and Estring.    Cloretta Ned, LPN Clinical Pharmacist Assistant  [phone]   Follow-Up:  Patient Assistance Coordination

## 2020-10-08 ENCOUNTER — Ambulatory Visit (INDEPENDENT_AMBULATORY_CARE_PROVIDER_SITE_OTHER): Payer: Medicare Other

## 2020-10-08 ENCOUNTER — Other Ambulatory Visit: Payer: Self-pay

## 2020-10-08 DIAGNOSIS — Z23 Encounter for immunization: Secondary | ICD-10-CM

## 2020-10-30 ENCOUNTER — Other Ambulatory Visit: Payer: Self-pay | Admitting: Family Medicine

## 2020-10-30 DIAGNOSIS — I10 Essential (primary) hypertension: Secondary | ICD-10-CM

## 2020-11-15 DIAGNOSIS — I152 Hypertension secondary to endocrine disorders: Secondary | ICD-10-CM | POA: Diagnosis not present

## 2020-11-15 DIAGNOSIS — E1142 Type 2 diabetes mellitus with diabetic polyneuropathy: Secondary | ICD-10-CM | POA: Diagnosis not present

## 2020-11-15 DIAGNOSIS — E1169 Type 2 diabetes mellitus with other specified complication: Secondary | ICD-10-CM | POA: Diagnosis not present

## 2020-11-15 DIAGNOSIS — E785 Hyperlipidemia, unspecified: Secondary | ICD-10-CM | POA: Diagnosis not present

## 2020-11-15 DIAGNOSIS — E1159 Type 2 diabetes mellitus with other circulatory complications: Secondary | ICD-10-CM | POA: Diagnosis not present

## 2020-11-15 LAB — HEMOGLOBIN A1C: Hemoglobin A1C: 7.2

## 2020-11-21 ENCOUNTER — Ambulatory Visit (INDEPENDENT_AMBULATORY_CARE_PROVIDER_SITE_OTHER): Payer: Medicare Other | Admitting: Family Medicine

## 2020-11-21 ENCOUNTER — Encounter: Payer: Self-pay | Admitting: Family Medicine

## 2020-11-21 ENCOUNTER — Other Ambulatory Visit: Payer: Self-pay

## 2020-11-21 VITALS — BP 130/70 | HR 76 | Ht 65.0 in | Wt 170.0 lb

## 2020-11-21 DIAGNOSIS — R112 Nausea with vomiting, unspecified: Secondary | ICD-10-CM

## 2020-11-21 DIAGNOSIS — I10 Essential (primary) hypertension: Secondary | ICD-10-CM

## 2020-11-21 DIAGNOSIS — K219 Gastro-esophageal reflux disease without esophagitis: Secondary | ICD-10-CM

## 2020-11-21 DIAGNOSIS — J301 Allergic rhinitis due to pollen: Secondary | ICD-10-CM | POA: Diagnosis not present

## 2020-11-21 DIAGNOSIS — G43109 Migraine with aura, not intractable, without status migrainosus: Secondary | ICD-10-CM

## 2020-11-21 DIAGNOSIS — E785 Hyperlipidemia, unspecified: Secondary | ICD-10-CM | POA: Diagnosis not present

## 2020-11-21 MED ORDER — LISINOPRIL 10 MG PO TABS: 10.0000 mg | ORAL_TABLET | Freq: Every day | ORAL | 1 refills | Status: DC

## 2020-11-21 MED ORDER — HYDROCHLOROTHIAZIDE 25 MG PO TABS: 25.0000 mg | ORAL_TABLET | Freq: Every morning | ORAL | 1 refills | Status: DC

## 2020-11-21 MED ORDER — PANTOPRAZOLE SODIUM 40 MG PO TBEC: 40.0000 mg | DELAYED_RELEASE_TABLET | Freq: Every day | ORAL | 1 refills | Status: DC

## 2020-11-21 MED ORDER — ROSUVASTATIN CALCIUM 20 MG PO TABS: ORAL_TABLET | ORAL | 1 refills | Status: DC

## 2020-11-21 MED ORDER — AMLODIPINE BESYLATE 5 MG PO TABS: 5.0000 mg | ORAL_TABLET | Freq: Every day | ORAL | 1 refills | Status: DC

## 2020-11-21 MED ORDER — ONDANSETRON HCL 4 MG PO TABS: 4.0000 mg | ORAL_TABLET | Freq: Three times a day (TID) | ORAL | 2 refills | Status: DC | PRN

## 2020-11-21 MED ORDER — MONTELUKAST SODIUM 10 MG PO TABS: 10.0000 mg | ORAL_TABLET | Freq: Every day | ORAL | 1 refills | Status: DC

## 2020-11-21 MED ORDER — SUMATRIPTAN SUCCINATE 50 MG PO TABS: 50.0000 mg | ORAL_TABLET | ORAL | 1 refills | Status: DC | PRN

## 2020-11-21 MED ORDER — FLUTICASONE PROPIONATE 50 MCG/ACT NA SUSP: 2.0000 | Freq: Every evening | NASAL | 1 refills | Status: DC

## 2020-11-21 NOTE — Progress Notes (Signed)
Date:  11/21/2020   Name:  Nicole Cook   DOB:  [birth year]   MRN:  [id number]   Chief Complaint: Gastroesophageal Reflux, Hypertension, Allergic Rhinitis , Headache, and Hyperlipidemia (Refill meds)  Gastroesophageal Reflux She reports no abdominal pain, no belching, no chest pain, no choking, no coughing, no dysphagia, no early satiety, no globus sensation, no heartburn, no hoarse voice, no nausea, no sore throat, no stridor, no tooth decay, no water brash or no wheezing. This is a chronic problem. The current episode started more than 1 year ago. The problem has been gradually improving. The symptoms are aggravated by certain foods. Pertinent negatives include no anemia, fatigue, melena, muscle weakness, orthopnea or weight loss. She has tried a PPI for the symptoms. The treatment provided no relief.  Hypertension This is a chronic problem. The current episode started more than 1 year ago. The problem has been gradually improving since onset. The problem is controlled. Associated symptoms include headaches. Pertinent negatives include no anxiety, blurred vision, chest pain, malaise/fatigue, neck pain, orthopnea, palpitations, peripheral edema, PND, shortness of breath or sweats. There are no associated agents to hypertension. Risk factors for coronary artery disease include dyslipidemia. Past treatments include calcium channel blockers, beta blockers, diuretics and ACE inhibitors. The current treatment provides moderate improvement. There are no compliance problems.  There is no history of angina, kidney disease, CAD/MI, CVA, heart failure, left ventricular hypertrophy, PVD or retinopathy. There is no history of chronic renal disease, a hypertension causing med or renovascular disease.  Headache  This is a chronic problem. The current episode started more than 1 year ago. The problem occurs daily. The problem has been gradually improving. The pain is located in the bilateral region. Pertinent  negatives include no abdominal pain, back pain, blurred vision, coughing, dizziness, ear pain, eye pain, eye redness, fever, insomnia, loss of balance, nausea, neck pain, numbness, photophobia, rhinorrhea, sinus pressure, sore throat, vomiting, weakness or weight loss. The treatment provided moderate relief. Her past medical history is significant for hypertension. There is no history of obesity.  Hyperlipidemia This is a chronic problem. The current episode started more than 1 year ago. The problem is controlled. Recent lipid tests were reviewed and are normal. Exacerbating diseases include diabetes. She has no history of chronic renal disease, hypothyroidism, liver disease, obesity or nephrotic syndrome. Pertinent negatives include no chest pain, focal sensory loss, focal weakness, leg pain, myalgias or shortness of breath. Current antihyperlipidemic treatment includes statins.    Lab Results  Component Value Date   CREATININE 0.79 05/21/2020   BUN 14 05/21/2020   NA 140 05/21/2020   K 4.7 05/21/2020   CL 99 05/21/2020   CO2 25 05/21/2020   Lab Results  Component Value Date   CHOL 130 05/21/2020   HDL 39 (L) 05/21/2020   LDLCALC 64 05/21/2020   TRIG 160 (H) 05/21/2020   CHOLHDL 3.3 03/09/2018   No results found for: TSH Lab Results  Component Value Date   HGBA1C 7.2 11/15/2020   Lab Results  Component Value Date   WBC 5.0 10/10/2018   HGB 12.4 10/10/2018   HCT 37.2 10/10/2018   MCV 86 10/10/2018   PLT 185 10/10/2018   Lab Results  Component Value Date   ALT 17 05/21/2020   AST 18 05/21/2020   ALKPHOS 45 (L) 05/21/2020   BILITOT 0.3 05/21/2020     Review of Systems  Constitutional: Negative.  Negative for chills, fatigue, fever, malaise/fatigue, unexpected weight change and  weight loss.  HENT: Negative for congestion, ear discharge, ear pain, hoarse voice, rhinorrhea, sinus pressure, sneezing and sore throat.   Eyes: Negative for blurred vision, double vision,  photophobia, pain, discharge, redness and itching.  Respiratory: Negative for cough, choking, shortness of breath, wheezing and stridor.   Cardiovascular: Negative for chest pain, palpitations, orthopnea and PND.  Gastrointestinal: Negative for abdominal pain, blood in stool, constipation, diarrhea, dysphagia, heartburn, melena, nausea and vomiting.  Endocrine: Negative for cold intolerance, heat intolerance, polydipsia, polyphagia and polyuria.  Genitourinary: Negative for dysuria, flank pain, frequency, hematuria, menstrual problem, pelvic pain, urgency, vaginal bleeding and vaginal discharge.  Musculoskeletal: Negative for arthralgias, back pain, myalgias, muscle weakness and neck pain.  Skin: Negative for rash.  Allergic/Immunologic: Negative for environmental allergies and food allergies.  Neurological: Positive for headaches. Negative for dizziness, focal weakness, weakness, light-headedness, numbness and loss of balance.  Hematological: Negative for adenopathy. Does not bruise/bleed easily.  Psychiatric/Behavioral: Negative for dysphoric mood. The patient is not nervous/anxious and does not have insomnia.     Patient Active Problem List   Diagnosis Date Noted  . Hyperlipidemia associated with type 2 diabetes mellitus ([place]) 12/25/2019  . Atrophic vaginitis 11/23/2018  . Pelvic floor relaxation 11/23/2018  . Primary osteoarthritis of right knee 05/12/2018  . Sacral back pain 08/04/2017  . Taking medication for chronic disease 03/02/2017  . Seasonal allergic rhinitis due to pollen 03/02/2017  . Gastroesophageal reflux disease 03/02/2017  . Migraine with aura and without status migrainosus, not intractable 03/02/2017  . Angina pectoris ([place]) 11/09/2016  . Chronic right-sided low back pain without sciatica 10/29/2016  . Trochanteric bursitis of right hip 07/31/2015  . Primary osteoarthritis of right hip 07/31/2015  . Type 2 diabetes mellitus with hyperglycemia, with long-term current  use of insulin ([place]) 04/03/2015  . Hyperlipidemia 04/03/2015  . Laboratory animal allergy 04/03/2015  . Cephalalgia 04/03/2015  . Essential hypertension 04/03/2015  . Blood glucose elevated 04/03/2015  . Routine general medical examination at a health care facility 04/03/2015  . CAD (coronary artery disease) 04/03/2015  . Dermatochalasis of both upper eyelids 09/20/2014  . Sleep apnea 09/03/2014  . DDD (degenerative disc disease), lumbosacral 03/03/2012  . Spinal stenosis of lumbar region without neurogenic claudication 03/03/2012  . Thoracic or lumbosacral neuritis or radiculitis, unspecified 03/03/2012    Allergies  Allergen Reactions  . Aspartame Other (See Comments)  . Compazine [Prochlorperazine]   . Penicillins   . Sulfa Antibiotics   . Sulfamethoxazole-Trimethoprim Hives and Other (See Comments)    Other Reaction: Not Assessed     Past Surgical History:  Procedure Laterality Date  . back surgery x 2    . c-section x 3    . COLONOSCOPY  2011   cleared for 5 yrs- Dr Beckey Downing  . KNEE ARTHROSCOPY Right 2010  . VAGINAL HYSTERECTOMY  12/14/1994    Social History   Tobacco Use  . Smoking status: Never Smoker  . Smokeless tobacco: Never Used  Vaping Use  . Vaping Use: Never used  Substance Use Topics  . Alcohol use: No    Alcohol/week: 0.0 standard drinks  . Drug use: No     Medication list has been reviewed and updated.  Current Meds  Medication Sig  . ACCU-CHEK FASTCLIX LANCETS MISC Accu-Chek [employer]  . amLODipine (NORVASC) 5 MG tablet Take 1 tablet (5 mg total) by mouth daily.  Marland Kitchen aspirin 81 MG tablet Take 1 tablet by mouth daily.  . cetirizine (ZYRTEC) 10 MG  tablet Take 10 mg by mouth daily. otc  . Cholecalciferol (VITAMIN D3) 1000 units CAPS Take 1 capsule by mouth daily.  . Coenzyme Q10 300 MG CAPS Take 1 capsule by mouth daily.  . Dulaglutide 0.75 MG/0.5ML SOPN Inject 1 each into the skin once a week. Dr Eddie Dibbles  .Marland KitchenSunday  . estradiol  (ESTRING) 2 MG vaginal ring Place 2 mg vaginally every 3 (three) months. follow package directions- GYN  . fluticasone (FLONASE) 50 MCG/ACT nasal spray One spray each nostril daily (Patient taking differently: 2 sprays at bedtime. One spray each nostril daily)  . gabapentin (NEURONTIN) 300 MG capsule Take 2 capsules by mouth 3 (three) times daily. [facility]- may take 3 capsules 3 times per day per pt  . hydrochlorothiazide (HYDRODIURIL) 25 MG tablet Take 1 tablet (25 mg total) by mouth every morning.  Marland Kitchen HYDROcodone-acetaminophen (NORCO) 10-325 MG per tablet Take 1 tablet by mouth as needed. [facility] Gets 40 tabs monthly  . lisinopril (ZESTRIL) 10 MG tablet TAKE 1 TABLET DAILY  . Magnesium 500 MG CAPS Take 1 capsule by mouth daily.  . metFORMIN (GLUCOPHAGE) 1000 MG tablet 1 tablet 2 (two) times daily.  . metoprolol succinate (TOPROL-XL) 25 MG 24 hr tablet Take 1 tablet (25 mg total) by mouth daily. Cardio- takes with 50mg = 75  . metoprolol succinate (TOPROL-XL) 50 MG 24 hr tablet Take with or immediately following a meal.  . montelukast (SINGULAIR) 10 MG tablet Take 1 tablet (10 mg total) by mouth daily. (Patient taking differently: Take 10 mg by mouth at bedtime.)  . Multiple Vitamins-Minerals (CENTRUM SILVER ULTRA WOMENS PO) Take 1 tablet by mouth daily.  . nitroGLYCERIN (NITROSTAT) 0.4 MG SL tablet Place 1 tablet under the tongue as needed.  Marland Kitchen olopatadine (PATANOL) 0.1 % ophthalmic solution Place 1 drop into both eyes 2 (two) times daily.  . ONE TOUCH ULTRA TEST test strip   . OXYCONTIN 10 MG 12 hr tablet Take 10 mg by mouth 2 (two) times daily. Pain Dr  . pantoprazole (PROTONIX) 40 MG tablet Take 1 tablet (40 mg total) by mouth daily.  . rosuvastatin (CRESTOR) 20 MG tablet TAKE 1 TABLET DAILY  . SUMAtriptan (IMITREX) 50 MG tablet Take 1 tablet (50 mg total) by mouth as needed. Per 90 days  . tiZANidine (ZANAFLEX) 4 MG tablet tizanidine 4 mg tablet  TAKE 1-2 TABLETS BY MOUTH AT NIGHT AT BEDTIME   . vitamin B-12 (CYANOCOBALAMIN) 1000 MCG tablet Take 1,000 mcg by mouth daily.    PHQ 2/9 Scores 11/21/2020 05/21/2020 12/25/2019 11/20/2019  PHQ - 2 Score 0 0 0 0  PHQ- 9 Score 0 0 - 0    GAD 7 : Generalized Anxiety Score 11/21/2020 05/21/2020  Nervous, Anxious, on Edge 0 0  Control/stop worrying 0 0  Worry too much - different things 0 0  Trouble relaxing 0 0  Restless 0 0  Easily annoyed or irritable 0 0  Afraid - awful might happen 0 0  Total GAD 7 Score 0 0    BP Readings from Last 3 Encounters:  11/21/20 130/70  05/21/20 120/60  01/11/20 120/60    Physical Exam Vitals and nursing note reviewed.  Constitutional:      Appearance: She is well-developed and well-nourished.  HENT:     Head: Normocephalic.     Right Ear: External ear normal.     Left Ear: External ear normal.     Mouth/Throat:     Mouth: Oropharynx is clear and  moist.  Eyes:     General: Lids are everted, no foreign bodies appreciated. No scleral icterus.       Left eye: No foreign body or hordeolum.     Extraocular Movements: EOM normal.     Conjunctiva/sclera: Conjunctivae normal.     Right eye: Right conjunctiva is not injected.     Left eye: Left conjunctiva is not injected.     Pupils: Pupils are equal, round, and reactive to light.  Neck:     Thyroid: No thyromegaly.     Vascular: No JVD.     Trachea: No tracheal deviation.  Cardiovascular:     Rate and Rhythm: Normal rate and regular rhythm.     Pulses: Intact distal pulses.     Heart sounds: Normal heart sounds. No murmur heard. No friction rub. No gallop.   Pulmonary:     Effort: Pulmonary effort is normal. No respiratory distress.     Breath sounds: Normal breath sounds. No wheezing or rales.  Abdominal:     General: Bowel sounds are normal.     Palpations: Abdomen is soft. There is no hepatosplenomegaly or mass.     Tenderness: There is no abdominal tenderness. There is no guarding or rebound.  Musculoskeletal:        General: No  tenderness or edema. Normal range of motion.     Cervical back: Normal range of motion and neck supple.  Lymphadenopathy:     Cervical: No cervical adenopathy.  Skin:    General: Skin is warm.     Findings: No rash.  Neurological:     Mental Status: She is alert and oriented to person, place, and time.     Deep Tendon Reflexes: Strength normal.  Psychiatric:        Mood and Affect: Mood and affect normal. Mood is not anxious or depressed.     Wt Readings from Last 3 Encounters:  11/21/20 170 lb (77.1 kg)  05/21/20 166 lb (75.3 kg)  01/11/20 158 lb (71.7 kg)    BP 130/70   Pulse 76   Ht 5\' 5"  (1.651 m)   Wt 170 lb (77.1 kg)   BMI 28.29 kg/m   Assessment and Plan: Patient's chart was reviewed for previous encounters, most recent labs, most recent imaging, and care everywhere.  Patient is followed at [facility] for endocrinology. 1. Essential hypertension Chronic.  Controlled.  Stable.  Blood pressure is 130/70.  Patient will continue amlodipine 5 mg once a day, hydrochlorothiazide 25 mg once a day, lisinopril 10 mg once a day, and metoprolol 50 mg and 25 mg both XL once a day.  Will recheck blood pressure in 6 months.  We'll recheck renal function panel today. - amLODipine (NORVASC) 5 MG tablet; Take 1 tablet (5 mg total) by mouth daily.  Dispense: 90 tablet; Refill: 1 - hydrochlorothiazide (HYDRODIURIL) 25 MG tablet; Take 1 tablet (25 mg total) by mouth every morning.  Dispense: 90 tablet; Refill: 1 - lisinopril (ZESTRIL) 10 MG tablet; Take 1 tablet (10 mg total) by mouth daily.  Dispense: 90 tablet; Refill: 1 - Renal Function Panel  2. Seasonal allergic rhinitis due to pollen Chronic.  Controlled.  Stable.  Continue Flonase and Singulair for control of seasonal allergy concerns. - fluticasone (FLONASE) 50 MCG/ACT nasal spray; Place 2 sprays into both nostrils at bedtime. One spray each nostril daily  Dispense: 16 g; Refill: 1 - montelukast (SINGULAIR) 10 MG tablet;  Take 1 tablet (10 mg total) by  mouth at bedtime.  Dispense: 90 tablet; Refill: 1  3. Gastroesophageal reflux disease, unspecified whether esophagitis present Chronic.  Controlled.  Stable.  Continue pantoprazole 40 mg once a day. - pantoprazole (PROTONIX) 40 MG tablet; Take 1 tablet (40 mg total) by mouth daily.  Dispense: 90 tablet; Refill: 1  4. Hyperlipidemia, unspecified hyperlipidemia type Chronic.  Controlled.  Stable.  Continue rosuvastatin 20 mg once a day.  Will check lipid panel for current status of lipid control. - rosuvastatin (CRESTOR) 20 MG tablet; TAKE 1 TABLET DAILY  Dispense: 90 tablet; Refill: 1 - Lipid Panel With LDL/HDL Ratio  5. Migraine with aura and without status migrainosus, not intractable Chronic.  Controlled.  Stable.  Continue Imitrex 50 mg once a day.  Today new prescription we'll add Zofran for nausea as needed. - SUMAtriptan (IMITREX) 50 MG tablet; Take 1 tablet (50 mg total) by mouth as needed. Per 90 days  Dispense: 10 tablet; Refill: 1 - ondansetron (ZOFRAN) 4 MG tablet; Take 1 tablet (4 mg total) by mouth every 8 (eight) hours as needed for nausea or vomiting.  Dispense: 20 tablet; Refill: 2  6. Nausea and vomiting, intractability of vomiting not specified, unspecified vomiting type Patient's had some episodes of migraines that did not present with aura which progressed to nausea and headache.  Patient would like some Zofran available for the nausea.  Zofran 4 mg 1 tablet as needed every 8 hours prescribed. - ondansetron (ZOFRAN) 4 MG tablet; Take 1 tablet (4 mg total) by mouth every 8 (eight) hours as needed for nausea or vomiting.  Dispense: 20 tablet; Refill: 2

## 2020-11-22 LAB — RENAL FUNCTION PANEL
Albumin: 4.6 g/dL (ref 3.7–4.7)
BUN/Creatinine Ratio: 19 (ref 12–28)
BUN: 15 mg/dL (ref 8–27)
CO2: 26 mmol/L (ref 20–29)
Calcium: 9.6 mg/dL (ref 8.7–10.3)
Chloride: 98 mmol/L (ref 96–106)
Creatinine, Ser: 0.8 mg/dL (ref 0.57–1.00)
GFR calc Af Amer: 86 mL/min/{1.73_m2} (ref >59)
GFR calc non Af Amer: 74 mL/min/{1.73_m2} (ref >59)
Glucose: 150 mg/dL — ABNORMAL HIGH (ref 65–99)
Phosphorus: 3.3 mg/dL (ref 3.0–4.3)
Potassium: 4.4 mmol/L (ref 3.5–5.2)
Sodium: 138 mmol/L (ref 134–144)

## 2020-11-22 LAB — LIPID PANEL WITH LDL/HDL RATIO
Cholesterol, Total: 120 mg/dL (ref 100–199)
HDL: 37 mg/dL — ABNORMAL LOW (ref >39)
LDL Chol Calc (NIH): 53 mg/dL (ref 0–99)
LDL/HDL Ratio: 1.4 ratio (ref 0.0–3.2)
Triglycerides: 184 mg/dL — ABNORMAL HIGH (ref 0–149)
VLDL Cholesterol Cal: 30 mg/dL (ref 5–40)

## 2020-12-02 ENCOUNTER — Telehealth: Payer: Self-pay | Admitting: Pharmacist

## 2020-12-02 NOTE — Progress Notes (Addendum)
  Chronic Care Management   Note  12/02/2020 Name: Nicole Cook MRN: [id number] DOB: November 26, 1951   Follow Up:  Chronic Care Management   Note  12/02/2020 Name: Nicole Cook MRN: [id number] DOB: 01/05/1951  The patient expressed she was needing patient assistance for [place]ulic[place] and Estring. Both applications have been completed and ready for patients signature and provider signature. PharmD updated on current need for completion.   Cloretta Ned, LPN Clinical Pharmacist Assistant   [phone]    Follow Up:   Applications returned to CPA for correction in prescribing physicians.  Junita Push. Kenton Kingfisher PharmD, [facility] [phone]

## 2020-12-09 ENCOUNTER — Other Ambulatory Visit: Payer: Self-pay | Admitting: Family Medicine

## 2020-12-09 DIAGNOSIS — I10 Essential (primary) hypertension: Secondary | ICD-10-CM

## 2020-12-10 ENCOUNTER — Other Ambulatory Visit: Payer: Self-pay | Admitting: Family Medicine

## 2020-12-10 DIAGNOSIS — I10 Essential (primary) hypertension: Secondary | ICD-10-CM

## 2020-12-25 ENCOUNTER — Ambulatory Visit (INDEPENDENT_AMBULATORY_CARE_PROVIDER_SITE_OTHER): Payer: Medicare Other

## 2020-12-25 DIAGNOSIS — Z1231 Encounter for screening mammogram for malignant neoplasm of breast: Secondary | ICD-10-CM

## 2020-12-25 DIAGNOSIS — Z0000 Encounter for general adult medical examination without abnormal findings: Secondary | ICD-10-CM | POA: Diagnosis not present

## 2020-12-25 NOTE — Patient Instructions (Signed)
Nicole Cook , Thank you for taking time to come for your Medicare Wellness Visit. I appreciate your ongoing commitment to your health goals. Please review the following plan we discussed and let me know if I can assist you in the future.   Screening recommendations/referrals: Colonoscopy: done 01/24/18. Repeat in 2024 Mammogram: done 01/16/20. Please call [phone] to schedule your mammogram.  Bone Density: done 01/17/20 Recommended yearly ophthalmology/optometry visit for glaucoma screening and checkup Recommended yearly dental visit for hygiene and checkup  Vaccinations: Influenza vaccine: done 10/08/20 Pneumococcal vaccine: done 08/29/14 Tdap vaccine: done 03/02/17 Shingles vaccine: Shingrix discussed. Please contact your pharmacy for coverage information.  Covid-19: done 01/24/20, 02/14/20 & 10/31/20  Advanced directives: Please bring a copy of your health care power of attorney and living will to the office at your convenience once you have completed those documents.   Conditions/risks identified: Recommend healthy eating and physical activity to help lower A1c  Next appointment: Follow up in one year for your annual wellness visit    Preventive Care 65 Years and Older, Female Preventive care refers to lifestyle choices and visits with your health care provider that can promote health and wellness. What does preventive care include?  A yearly physical exam. This is also called an annual well check.  Dental exams once or twice a year.  Routine eye exams. Ask your health care provider how often you should have your eyes checked.  Personal lifestyle choices, including:  Daily care of your teeth and gums.  Regular physical activity.  Eating a healthy diet.  Avoiding tobacco and drug use.  Limiting alcohol use.  Practicing safe sex.  Taking low-dose aspirin every day.  Taking vitamin and mineral supplements as recommended by your health care provider. What happens during  an annual well check? The services and screenings done by your health care provider during your annual well check will depend on your age, overall health, lifestyle risk factors, and family history of disease. Counseling  Your health care provider may ask you questions about your:  Alcohol use.  Tobacco use.  Drug use.  Emotional well-being.  Home and relationship well-being.  Sexual activity.  Eating habits.  History of falls.  Memory and ability to understand (cognition).  Work and work Statistician.  Reproductive health. Screening  You may have the following tests or measurements:  Height, weight, and BMI.  Blood pressure.  Lipid and cholesterol levels. These may be checked every 5 years, or more frequently if you are over 13 years old.  Skin check.  Lung cancer screening. You may have this screening every year starting at age 14 if you have a 30-pack-year history of smoking and currently smoke or have quit within the past 15 years.  Fecal occult blood test (FOBT) of the stool. You may have this test every year starting at age 46.  Flexible sigmoidoscopy or colonoscopy. You may have a sigmoidoscopy every 5 years or a colonoscopy every 10 years starting at age 10.  Hepatitis C blood test.  Hepatitis B blood test.  Sexually transmitted disease (STD) testing.  Diabetes screening. This is done by checking your blood sugar (glucose) after you have not eaten for a while (fasting). You may have this done every 1-3 years.  Bone density scan. This is done to screen for osteoporosis. You may have this done starting at age 53.  Mammogram. This may be done every 1-2 years. Talk to your health care provider about how often you should have regular mammograms.  Talk with your health care provider about your test results, treatment options, and if necessary, the need for more tests. Vaccines  Your health care provider may recommend certain vaccines, such as:  Influenza  vaccine. This is recommended every year.  Tetanus, diphtheria, and acellular pertussis (Tdap, Td) vaccine. You may need a Td booster every 10 years.  Zoster vaccine. You may need this after age 58.  Pneumococcal 13-valent conjugate (PCV13) vaccine. One dose is recommended after age 2.  Pneumococcal polysaccharide (PPSV23) vaccine. One dose is recommended after age 97. Talk to your health care provider about which screenings and vaccines you need and how often you need them. This information is not intended to replace advice given to you by your health care provider. Make sure you discuss any questions you have with your health care provider. Document Released: 12/27/2015 Document Revised: 08/19/2016 Document Reviewed: 10/01/2015 Elsevier Interactive Patient Education  2017 [employer] Prevention in the Home Falls can cause injuries. They can happen to people of all ages. There are many things you can do to make your home safe and to help prevent falls. What can I do on the outside of my home?  Regularly fix the edges of walkways and driveways and fix any cracks.  Remove anything that might make you trip as you walk through a door, such as a raised step or threshold.  Trim any bushes or trees on the path to your home.  Use bright outdoor lighting.  Clear any walking paths of anything that might make someone trip, such as rocks or tools.  Regularly check to see if handrails are loose or broken. Make sure that both sides of any steps have handrails.  Any raised decks and porches should have guardrails on the edges.  Have any leaves, snow, or ice cleared regularly.  Use sand or salt on walking paths during winter.  Clean up any spills in your garage right away. This includes oil or grease spills. What can I do in the bathroom?  Use night lights.  Install grab bars by the toilet and in the tub and shower. Do not use towel bars as grab bars.  Use non-skid mats or decals  in the tub or shower.  If you need to sit down in the shower, use a plastic, non-slip stool.  Keep the floor dry. Clean up any water that spills on the floor as soon as it happens.  Remove soap buildup in the tub or shower regularly.  Attach bath mats securely with double-sided non-slip rug tape.  Do not have throw rugs and other things on the floor that can make you trip. What can I do in the bedroom?  Use night lights.  Make sure that you have a light by your bed that is easy to reach.  Do not use any sheets or blankets that are too big for your bed. They should not hang down onto the floor.  Have a firm chair that has side arms. You can use this for support while you get dressed.  Do not have throw rugs and other things on the floor that can make you trip. What can I do in the kitchen?  Clean up any spills right away.  Avoid walking on wet floors.  Keep items that you use a lot in easy-to-reach places.  If you need to reach something above you, use a strong step stool that has a grab bar.  Keep electrical cords out of the way.  Do not use floor polish or wax that makes floors slippery. If you must use wax, use non-skid floor wax.  Do not have throw rugs and other things on the floor that can make you trip. What can I do with my stairs?  Do not leave any items on the stairs.  Make sure that there are handrails on both sides of the stairs and use them. Fix handrails that are broken or loose. Make sure that handrails are as long as the stairways.  Check any carpeting to make sure that it is firmly attached to the stairs. Fix any carpet that is loose or worn.  Avoid having throw rugs at the top or bottom of the stairs. If you do have throw rugs, attach them to the floor with carpet tape.  Make sure that you have a light switch at the top of the stairs and the bottom of the stairs. If you do not have them, ask someone to add them for you. What else can I do to help  prevent falls?  Wear shoes that:  Do not have high heels.  Have rubber bottoms.  Are comfortable and fit you well.  Are closed at the toe. Do not wear sandals.  If you use a stepladder:  Make sure that it is fully opened. Do not climb a closed stepladder.  Make sure that both sides of the stepladder are locked into place.  Ask someone to hold it for you, if possible.  Clearly mark and make sure that you can see:  Any grab bars or handrails.  First and last steps.  Where the edge of each step is.  Use tools that help you move around (mobility aids) if they are needed. These include:  Canes.  Walkers.  Scooters.  Crutches.  Turn on the lights when you go into a dark area. Replace any light bulbs as soon as they burn out.  Set up your furniture so you have a clear path. Avoid moving your furniture around.  If any of your floors are uneven, fix them.  If there are any pets around you, be aware of where they are.  Review your medicines with your doctor. Some medicines can make you feel dizzy. This can increase your chance of falling. Ask your doctor what other things that you can do to help prevent falls. This information is not intended to replace advice given to you by your health care provider. Make sure you discuss any questions you have with your health care provider. Document Released: 09/26/2009 Document Revised: 05/07/2016 Document Reviewed: 01/04/2015 Elsevier Interactive Patient Education  2017 [employer].

## 2020-12-25 NOTE — Progress Notes (Signed)
Subjective:   Nicole Cook is a 70 y.o. female who presents for Medicare Annual (Subsequent) preventive examination.  Virtual Visit via Telephone Note  I connected with  Nicole Cook on 12/25/20 at 10:40 AM EST by telephone and verified that I am speaking with the correct person using two identifiers.  Location: Patient: home Provider: [facility] Persons participating in the virtual visit: [place]   I discussed the limitations, risks, security and privacy concerns of performing an evaluation and management service by telephone and the availability of in person appointments. The patient expressed understanding and agreed to proceed.  Interactive audio and video telecommunications were attempted between this nurse and patient, however failed, due to patient having technical difficulties OR patient did not have access to video capability.  We continued and completed visit with audio only.  Some vital signs may be absent or patient reported.   Clemetine Marker, LPN    Review of Systems     Cardiac Risk Factors include: advanced age (>33men, >68 women);dyslipidemia;hypertension;diabetes mellitus     Objective:    Today's Vitals   12/25/20 1026  PainSc: 2    There is no height or weight on file to calculate BMI.  Advanced Directives 12/25/2020 12/25/2019 11/23/2018 11/15/2017 10/07/2015 09/02/2015  Does Patient Have a Medical Advance Directive? No No No No No No  Would patient like information on creating a medical advance directive? No - Patient declined Yes (MAU/Ambulatory/Procedural Areas - Information given) Yes (MAU/Ambulatory/Procedural Areas - Information given) Yes (MAU/Ambulatory/Procedural Areas - Information given) No - patient declined information No - patient declined information    Current Medications (verified) Outpatient Encounter Medications as of 12/25/2020  Medication Sig  . ACCU-CHEK FASTCLIX LANCETS MISC Accu-Chek [employer]  .  amLODipine (NORVASC) 5 MG tablet Take 1 tablet (5 mg total) by mouth daily.  Marland Kitchen aspirin 81 MG tablet Take 1 tablet by mouth daily.  . cetirizine (ZYRTEC) 10 MG tablet Take 10 mg by mouth daily. otc  . Cholecalciferol (VITAMIN D3) 1000 units CAPS Take 1 capsule by mouth daily.  . Coenzyme Q10 300 MG CAPS Take 1 capsule by mouth daily.  . Dulaglutide 0.75 MG/0.5ML SOPN Inject 1 each into the skin once a week. Dr Eddie Dibbles  .Marland KitchenSunday  . estradiol (ESTRING) 2 MG vaginal ring Place 2 mg vaginally every 3 (three) months. follow package directions- GYN  . fluticasone (FLONASE) 50 MCG/ACT nasal spray Place 2 sprays into both nostrils at bedtime. One spray each nostril daily  . gabapentin (NEURONTIN) 300 MG capsule Take 2 capsules by mouth 3 (three) times daily. [facility]- may take 3 capsules 3 times per day per pt  . hydrochlorothiazide (HYDRODIURIL) 25 MG tablet Take 1 tablet (25 mg total) by mouth every morning.  Marland Kitchen HYDROcodone-acetaminophen (NORCO) 10-325 MG per tablet Take 1 tablet by mouth as needed. [facility] Gets 40 tabs monthly  . lisinopril (ZESTRIL) 10 MG tablet Take 1 tablet (10 mg total) by mouth daily.  . Magnesium 500 MG CAPS Take 1 capsule by mouth daily.  . metFORMIN (GLUCOPHAGE) 1000 MG tablet 1 tablet 2 (two) times daily.  . metoprolol succinate (TOPROL-XL) 25 MG 24 hr tablet TAKE 1 TABLET DAILY (TAKE WITH 50 MG TO EQUAL 75)  . metoprolol succinate (TOPROL-XL) 50 MG 24 hr tablet TAKE WITH OR IMMEDIATELY FOLLOWING A MEAL  . montelukast (SINGULAIR) 10 MG tablet Take 1 tablet (10 mg total) by mouth at bedtime.  . Multiple Vitamins-Minerals (CENTRUM SILVER ULTRA  WOMENS PO) Take 1 tablet by mouth daily.  . nitroGLYCERIN (NITROSTAT) 0.4 MG SL tablet Place 1 tablet under the tongue as needed.  Marland Kitchen olopatadine (PATANOL) 0.1 % ophthalmic solution Place 1 drop into both eyes 2 (two) times daily.  . ondansetron (ZOFRAN) 4 MG tablet Take 1 tablet (4 mg total) by mouth every 8 (eight) hours as needed for  nausea or vomiting.  . ONE TOUCH ULTRA TEST test strip   . OXYCONTIN 10 MG 12 hr tablet Take 10 mg by mouth 2 (two) times daily. Pain Dr  . pantoprazole (PROTONIX) 40 MG tablet Take 1 tablet (40 mg total) by mouth daily.  . rosuvastatin (CRESTOR) 20 MG tablet TAKE 1 TABLET DAILY  . SUMAtriptan (IMITREX) 50 MG tablet Take 1 tablet (50 mg total) by mouth as needed. Per 90 days  . tiZANidine (ZANAFLEX) 4 MG tablet tizanidine 4 mg tablet  TAKE 1-2 TABLETS BY MOUTH AT NIGHT AT BEDTIME  . vitamin B-12 (CYANOCOBALAMIN) 1000 MCG tablet Take 1,000 mcg by mouth daily.  . insulin lispro (HUMALOG) 100 UNIT/ML KwikPen Humalog KwikPen (U-100) Insulin 100 unit/mL subcutaneous  INJECT 2-8 UNITS BEFORE MEALS AS DIRECTED (Patient not taking: Reported on 12/25/2020)   No facility-administered encounter medications on file as of 12/25/2020.    Allergies (verified) Aspartame, Compazine [prochlorperazine], Penicillins, Sulfa antibiotics, and Sulfamethoxazole-trimethoprim   History: Past Medical History:  Diagnosis Date  . Allergy   . Arthritis   . Cancer ([place])    basal cell skin ca  . Diabetes mellitus without complication ([place])   . GERD (gastroesophageal reflux disease)   . Hyperlipidemia   . Hypertension   . Migraine   . Myocardial infarction [facility]) May, 2011  . Oxygen deficiency    CPAP  . Pelvic floor relaxation   . Sleep apnea   . Thoracic or lumbosacral neuritis or radiculitis, unspecified    Past Surgical History:  Procedure Laterality Date  . back surgery x 2    . c-section x 3    . [place]  . COLONOSCOPY  2011   cleared for 5 yrs- Dr Beckey Downing  . EYE SURGERY     eyelid lesion  . KNEE ARTHROSCOPY Right 2010  . SPINE SURGERY  2003,2006  . TUBAL LIGATION  1982  . VAGINAL HYSTERECTOMY  12/14/1994   Family History  Problem Relation Age of Onset  . Breast cancer Mother 61  . Arthritis Mother   . Cancer Mother   . COPD Mother   . Diabetes Mother   .  Hearing loss Mother   . Heart disease Mother   . Hypertension Mother   . Stroke Mother   . Breast cancer Sister 41  . Cancer Sister   . Breast cancer Paternal Aunt   . Cancer Paternal Aunt   . Parkinson's disease Father   . Diabetes Father   . Heart disease Father   . Alcohol abuse Brother   . Cancer Paternal Grandmother   . Diabetes Brother   . Diabetes Brother   . Stroke Maternal Grandfather    Social History   Socioeconomic History  . Marital status: Married    Spouse name: Not on file  . Number of children: 3  . Years of education: Not on file  . Highest education level: Bachelor's degree (e.g., BA, AB, BS)  Occupational History  . Occupation: Retired  Tobacco Use  . Smoking status: Never Smoker  . Smokeless tobacco: Never Used  Vaping Use  .  Vaping Use: Never used  Substance and Sexual Activity  . Alcohol use: No    Alcohol/week: 0.0 standard drinks  . Drug use: No  . Sexual activity: Yes    Birth control/protection: Post-menopausal  Other Topics Concern  . Not on file  Social History Narrative  . Not on file   Social Determinants of Health   Financial Resource Strain: Low Risk   . Difficulty of Paying Living Expenses: Not very hard  Food Insecurity: No Food Insecurity  . Worried About Programme researcher, broadcasting/film/video in the Last Year: Never true  . Ran Out of Food in the Last Year: Never true  Transportation Needs: No Transportation Needs  . Lack of Transportation (Medical): No  . Lack of Transportation (Non-Medical): No  Physical Activity: Inactive  . Days of Exercise per Week: 0 days  . Minutes of Exercise per Session: 0 min  Stress: No Stress Concern Present  . Feeling of Stress : Not at all  Social Connections: Moderately Integrated  . Frequency of Communication with Friends and Family: More than three times a week  . Frequency of Social Gatherings with Friends and Family: Three times a week  . Attends Religious Services: More than 4 times per year  . Active  Member of Clubs or Organizations: No  . Attends Banker Meetings: Never  . Marital Status: Married    Tobacco Counseling Counseling given: Not Answered   Clinical Intake:  Pre-visit preparation completed: Yes  Pain : 0-10 Pain Score: 2  Pain Type: Chronic pain Pain Location: Back Pain Orientation: Lower Pain Descriptors / Indicators: Aching,Sore Pain Onset: More than a month ago Pain Frequency: Constant     Nutritional Risks: None Diabetes: Yes CBG done?: No Did pt. bring in CBG monitor from home?: No  How often do you need to have someone help you when you read instructions, pamphlets, or other written materials from your doctor or pharmacy?: 1 - Never  Nutrition Risk Assessment:  Has the patient had any N/V/D within the last 2 months?  No  Does the patient have any non-healing wounds?  No  Has the patient had any unintentional weight loss or weight gain?  No   Diabetes:  Is the patient diabetic?  Yes  If diabetic, was a CBG obtained today?  No  Did the patient bring in their glucometer from home?  No  How often do you monitor your CBG's? 1-2 times daily.   Financial Strains and Diabetes Management:  Are you having any financial strains with the device, your supplies or your medication? Yes  - trulicity  Does the patient want to be seen by Chronic Care Management for management of their diabetes?  Yes - already enrolled Would the patient like to be referred to a Nutritionist or for Diabetic Management?  No   Diabetic Exams:  Diabetic Eye Exam: Completed 08/15/19 negative retinopathy [facility]. Overdue for diabetic eye exam. Pt has been advised about the importance in completing this exam.   Diabetic Foot Exam: Completed 05/03/20.    Interpreter Needed?: No  Information entered by :: Reather Littler LPN   Activities of Daily Living In your present state of health, do you have any difficulty performing the following activities: 12/25/2020   Hearing? N  Comment declines hearing aids  Vision? N  Difficulty concentrating or making decisions? N  Walking or climbing stairs? N  Dressing or bathing? N  Doing errands, shopping? N  Preparing Food and eating ?  N  Using the Toilet? N  In the past six months, have you accidently leaked urine? Y  Do you have problems with loss of bowel control? N  Managing your Medications? N  Managing your Finances? N  Housekeeping or managing your Housekeeping? N  Some recent data might be hidden    Patient Care Team: Juline Patch, MD as PCP - General (Family Medicine) Vergia Alcon, MD as Consulting Physician (Cardiology) Krystal Eaton, MD as Consulting Physician (Occupational Medicine) Enos Fling, MD as Consulting Physician (Obstetrics and Gynecology) Vladimir Faster, [facility] (Pharmacist) Lonia Farber, MD as Consulting Physician (Internal Medicine)  Indicate any recent Medical Services you may have received from other than [facility] providers in the past year (date may be approximate).     Assessment:   This is a routine wellness examination for Nicole Cook.  Hearing/Vision screen  Hearing Screening   '125Hz'$  $Remo'250Hz'uLGuc$'500Hz'$'1000Hz'$'2000Hz'$'3000Hz'$'4000Hz'$'6000Hz'$'8000Hz'$   Right ear:           Left ear:           Comments: Pt denies hearing difficulty  Vision Screening Comments: Annual vision screenings at [facility]; due for exam   Dietary issues and exercise activities discussed: Current Exercise Habits: The patient does not participate in regular exercise at present, Exercise limited by: orthopedic condition(s)  Goals    . DIET - INCREASE WATER INTAKE     Recommend to drink at least 6-8 8oz glasses of water per day     . Patient Stated     Patient states she would like to lower A1c to less that 7 over the next yeat. Currently 7.2 on 11/15/20.     Marland Kitchen PharmD [place] (see longitudinal plan of care for additional care plan information)  Current Barriers:   . Chronic Disease Management support, education, and care coordination needs related to Hypertension, Hyperlipidemia, Diabetes, and Coronary Artery Disease   Hypertension BP Readings from Last 3 Encounters:  05/21/20 120/60  01/11/20 120/60  11/20/19 130/60   . Pharmacist Clinical Goal(s): o Over the next 90days, patient will work with PharmD and providers to maintain BP goal <130/80 . Current regimen:  . Lisinopril 10 mg daily  . Hydrochlorothiazide 25 mg daily . Metoprolol succinate 75 mg daily (takes 25 mg + 50 mg) . Amlodipine 5 mg daily . Interventions: . Comprehensive medication review performed, medication list updated in electronic medical record . Inter-disciplinary care team collaboration (see longitudinal plan of care) o Discussed  dosage delivery design of Toprol XL allows tablets to be halved. PharmD will collaborate with PCP if 50 mg 1.5 tablet RX will save patient on copays . Patient self care activities - Over the next 90 days, patient will: o Check BP weekly document, and provide at future appointments o Ensure daily salt intake < 2300 mg/day o Contact insurance regarding copay savings of Metoprolol succinate 50 mg 1.5 tabs daily o Contact PharmD for current tablet verification.  Hyperlipidemia/CAD Lab Results  Component Value Date/Time   LDLCALC 64 05/21/2020 09:53 AM   . Pharmacist Clinical Goal(s): o Over the next 90 days, patient will work with PharmD and providers to maintain LDL goal < 70 . Current regimen:  . Rosuvastatin 20 mg . Aspirin 81 mg qd . Nitrostat 0.4 mg prn  . Interventions: . Comprehensive medication review performed, medication list updated in electronic medical record . Inter-disciplinary care team collaboration (see longitudinal plan  of care)  . Patient self care activities - Over the next 90 days, patient will: o Continue exercise regimen aiming for 30 minutes/day 5 days/week. o Continue following DASH diet.  Diabetes Lab  Results  Component Value Date/Time   HGBA1C 6.8 05/03/2020 12:00 AM   HGBA1C 6.4 11/14/2019 12:00 AM   . Pharmacist Clinical Goal(s): o Over the next 90 days, patient will work with PharmD and providers to maintain A1c goal <7% . Current regimen:  . Trulicity 0.86 mg weekly on Sunday . Metformin 1000 mg bid . Insulin lispro 2-8 units before meals prn . Interventions: . Comprehensive medication review performed, medication list updated in electronic medical record . Inter-disciplinary care team collaboration (see longitudinal plan of care) . Will initiate patient assistance for Trulicity . Patient self care activities - Over the next 90 days, patient will: o Check blood sugar once daily, document, and provide at future appointments o Contact provider with any episodes of hypoglycemia o Provide requested portion of PAP   Postmenopausal Vaginal Atrophy Current Regimen: Estring vaginal ring every 3 weeks . Interventions: o Will research available patient assistance options and initiate process for copay assistance . Patient self care activities - Over the next 90 days, patient will: o Provide requested portion of PAP application  Medication management . Pharmacist Clinical Goal(s): o Over the next 90 days, patient will work with PharmD and providers to maintain optimal medication adherence . [facility] . Interventions o Comprehensive medication review performed. o Continue current medication management strategy . Patient self care activities - Over the next 90 days, patient will o Take medications as prescribed o Report any questions or concerns to PharmD and/or provider(s)  Initial goal documentation       Depression Screen PHQ 2/9 Scores 12/25/2020 11/21/2020 05/21/2020 12/25/2019 11/20/2019 05/24/2019 11/23/2018  PHQ - 2 Score 0 0 0 0 0 0 0  PHQ- 9 Score - 0 0 - 0 0 1    Fall Risk Fall Risk  12/25/2020 11/21/2020 05/21/2020 12/25/2019 11/29/2019  Falls  in the past year? 0 0 0 1 0  Comment - - - - -  Number falls in past yr: 0 - - 1 -  Comment - - - mechanical fall -  Injury with Fall? 0 - - 0 -  Risk for fall due to : No Fall Risks - - No Fall Risks -  Risk for fall due to: Comment - - - - -  Follow up Falls prevention discussed Falls evaluation completed Falls evaluation completed Falls prevention discussed Falls evaluation completed    FALL RISK PREVENTION PERTAINING TO THE HOME:  Any stairs in or around the home? Yes  If so, are there any without handrails? No  Home free of loose throw rugs in walkways, pet beds, electrical cords, etc? Yes  Adequate lighting in your home to reduce risk of falls? Yes   ASSISTIVE DEVICES UTILIZED TO PREVENT FALLS:  Life alert? No  Use of a cane, walker or w/c? No  Grab bars in the bathroom? Yes  Shower chair or bench in shower? Yes  Elevated toilet seat or a handicapped toilet? No   TIMED UP AND GO:  Was the test performed? No . Telephonic visit  Cognitive Function: Normal cognitive status assessed by direct observation by this Nurse Health Advisor. No abnormalities found.       6CIT Screen 12/25/2019 11/15/2017  What Year? 0 points 0 points  What month? 0 points 0 points  What time? 0 points 0 points  Count back from 20 0 points 0 points  Months in reverse 0 points 0 points  Repeat phrase 0 points 4 points  Total Score 0 4    Immunizations Immunization History  Administered Date(s) Administered  . Fluad Quad(high Dose 65+) 10/02/2019, 10/08/2020  . Hepatitis B 12/14/1998  . Influenza, High Dose Seasonal PF 09/06/2017, 09/26/2018  . Influenza,inj,Quad PF,6+ Mos 09/02/2015, 09/02/2016  . Influenza-Unspecified 09/13/2012, 08/14/2013, 01/10/2015, 02/10/2015, 08/15/2015, 09/02/2016, 08/14/2017, 11/20/2019  . MMR 05/14/1981  . Moderna Sars-Covid-2 Vaccination 10/31/2020  . PFIZER SARS-COV-2 Vaccination 01/24/2020, 02/14/2020  . Pneumococcal Polysaccharide-23 08/29/2014  .  Pneumococcal-Unspecified 05/14/2009, 08/14/2014  . Td 12/14/2005  . Tdap 01/14/2007, 02/12/2016, 03/02/2017  . Zoster 01/14/2015, 02/10/2015    TDAP status: Up to date  Flu Vaccine status: Up to date  Pneumococcal vaccine status: Up to date  Covid-19 vaccine status: Completed vaccines  Qualifies for Shingles Vaccine? Yes   Zostavax completed Yes   Shingrix Completed?: No.    Education has been provided regarding the importance of this vaccine. Patient has been advised to call insurance company to determine out of pocket expense if they have not yet received this vaccine. Advised may also receive vaccine at local pharmacy or Health Dept. Verbalized acceptance and understanding.  Screening Tests Health Maintenance  Topic Date Due  . OPHTHALMOLOGY EXAM  08/14/2020  . MAMMOGRAM  01/15/2021  . FOOT EXAM  05/03/2021  . HEMOGLOBIN A1C  05/16/2021  . COLONOSCOPY (Pts 45-36yrs Insurance coverage will need to be confirmed)  01/24/2023  . TETANUS/TDAP  03/03/2027  . INFLUENZA VACCINE  Completed  . DEXA SCAN  Completed  . COVID-19 Vaccine  Completed  . Hepatitis C Screening  Completed  . PNA vac Low Risk Adult  Completed    Health Maintenance  Health Maintenance Due  Topic Date Due  . OPHTHALMOLOGY EXAM  08/14/2020    Colorectal cancer screening: Type of screening: Colonoscopy. Completed 01/24/18. Repeat every 10 years  Mammogram status: Completed 01/16/20. Repeat every year  Bone Density status: Completed 01/17/20. Results reflect: Bone density results: NORMAL. Repeat every 2 years.  Lung Cancer Screening: (Low Dose CT Chest recommended if Age 38-80 years, 30 pack-year currently smoking OR have quit w/in 15years.) does not qualify.    Additional Screening:  Hepatitis C Screening: does qualify; Completed 03/02/16  Vision Screening: Recommended annual ophthalmology exams for early detection of glaucoma and other disorders of the eye. Is the patient up to date with their annual eye  exam?  No  Who is the provider or what is the name of the office in which the patient attends annual eye exams? [facility]  Dental Screening: Recommended annual dental exams for proper oral hygiene  Community Resource Referral / Chronic Care Management: CRR required this visit?  No   CCM required this visit?  No      Plan:     I have personally reviewed and noted the following in the patient's chart:   . Medical and social history . Use of alcohol, tobacco or illicit drugs  . Current medications and supplements . Functional ability and status . Nutritional status . Physical activity . Advanced directives . List of other physicians . Hospitalizations, surgeries, and ER visits in previous 12 months . Vitals . Screenings to include cognitive, depression, and falls . Referrals and appointments  In addition, I have reviewed and discussed with patient certain preventive protocols, quality metrics, and best practice recommendations. A written  personalized care plan for preventive services as well as general preventive health recommendations were provided to patient.     Clemetine Marker, LPN   07/02/9469   Nurse Notes: none

## 2021-01-02 ENCOUNTER — Telehealth: Payer: Self-pay | Admitting: Pharmacist

## 2021-01-02 NOTE — Progress Notes (Signed)
Chronic Care Management Pharmacy Assistant   Name: Nicole Cook  MRN: [id number] DOB: [birth year]  Reason for Encounter: Patient Assistance Applications   PCP : Juline Patch, MD  Allergies:   Allergies  Allergen Reactions  . Aspartame Other (See Comments)  . Compazine [Prochlorperazine]   . Penicillins   . Sulfa Antibiotics   . Sulfamethoxazole-Trimethoprim Hives and Other (See Comments)    Other Reaction: Not Assessed     Medications: Outpatient Encounter Medications as of 01/02/2021  Medication Sig Note  . ACCU-CHEK FASTCLIX LANCETS MISC Accu-Chek [employer]   . amLODipine (NORVASC) 5 MG tablet Take 1 tablet (5 mg total) by mouth daily.   Marland Kitchen aspirin 81 MG tablet Take 1 tablet by mouth daily.   . cetirizine (ZYRTEC) 10 MG tablet Take 10 mg by mouth daily. otc   . Cholecalciferol (VITAMIN D3) 1000 units CAPS Take 1 capsule by mouth daily.   . Coenzyme Q10 300 MG CAPS Take 1 capsule by mouth daily.   . Dulaglutide 0.75 MG/0.5ML SOPN Inject 1 each into the skin once a week. Dr Eddie Dibbles  .Marland KitchenSunday   . estradiol (ESTRING) 2 MG vaginal ring Place 2 mg vaginally every 3 (three) months. follow package directions- GYN   . fluticasone (FLONASE) 50 MCG/ACT nasal spray Place 2 sprays into both nostrils at bedtime. One spray each nostril daily   . gabapentin (NEURONTIN) 300 MG capsule Take 2 capsules by mouth 3 (three) times daily. [facility]- may take 3 capsules 3 times per day per pt   . hydrochlorothiazide (HYDRODIURIL) 25 MG tablet Take 1 tablet (25 mg total) by mouth every morning.   Marland Kitchen HYDROcodone-acetaminophen (NORCO) 10-325 MG per tablet Take 1 tablet by mouth as needed. [facility] Gets 40 tabs monthly   . insulin lispro (HUMALOG) 100 UNIT/ML KwikPen Humalog KwikPen (U-100) Insulin 100 unit/mL subcutaneous  INJECT 2-8 UNITS BEFORE MEALS AS DIRECTED (Patient not taking: Reported on 12/25/2020) 12/25/2020: Patient uses only as needed when she receives steroid injection   .  lisinopril (ZESTRIL) 10 MG tablet Take 1 tablet (10 mg total) by mouth daily.   . Magnesium 500 MG CAPS Take 1 capsule by mouth daily.   . metFORMIN (GLUCOPHAGE) 1000 MG tablet 1 tablet 2 (two) times daily.   . metoprolol succinate (TOPROL-XL) 25 MG 24 hr tablet TAKE 1 TABLET DAILY (TAKE WITH 50 MG TO EQUAL 75)   . metoprolol succinate (TOPROL-XL) 50 MG 24 hr tablet TAKE WITH OR IMMEDIATELY FOLLOWING A MEAL   . montelukast (SINGULAIR) 10 MG tablet Take 1 tablet (10 mg total) by mouth at bedtime.   . Multiple Vitamins-Minerals (CENTRUM SILVER ULTRA WOMENS PO) Take 1 tablet by mouth daily.   . nitroGLYCERIN (NITROSTAT) 0.4 MG SL tablet Place 1 tablet under the tongue as needed.   Marland Kitchen olopatadine (PATANOL) 0.1 % ophthalmic solution Place 1 drop into both eyes 2 (two) times daily.   . ondansetron (ZOFRAN) 4 MG tablet Take 1 tablet (4 mg total) by mouth every 8 (eight) hours as needed for nausea or vomiting.   . ONE TOUCH ULTRA TEST test strip    . OXYCONTIN 10 MG 12 hr tablet Take 10 mg by mouth 2 (two) times daily. Pain Dr   . pantoprazole (PROTONIX) 40 MG tablet Take 1 tablet (40 mg total) by mouth daily.   . rosuvastatin (CRESTOR) 20 MG tablet TAKE 1 TABLET DAILY   . SUMAtriptan (IMITREX) 50 MG tablet Take 1 tablet (50  mg total) by mouth as needed. Per 90 days   . tiZANidine (ZANAFLEX) 4 MG tablet tizanidine 4 mg tablet  TAKE 1-2 TABLETS BY MOUTH AT NIGHT AT BEDTIME   . vitamin B-12 (CYANOCOBALAMIN) 1000 MCG tablet Take 1,000 mcg by mouth daily.    No facility-administered encounter medications on file as of 01/02/2021.    Current Diagnosis: Patient Active Problem List   Diagnosis Date Noted  . Hyperlipidemia associated with type 2 diabetes mellitus ([place]) 12/25/2019  . Atrophic vaginitis 11/23/2018  . Pelvic floor relaxation 11/23/2018  . Primary osteoarthritis of right knee 05/12/2018  . Sacral back pain 08/04/2017  . Taking medication for chronic disease 03/02/2017  . Seasonal allergic  rhinitis due to pollen 03/02/2017  . Gastroesophageal reflux disease 03/02/2017  . Migraine with aura and without status migrainosus, not intractable 03/02/2017  . Angina pectoris ([place]) 11/09/2016  . Chronic right-sided low back pain without sciatica 10/29/2016  . Trochanteric bursitis of right hip 07/31/2015  . Primary osteoarthritis of right hip 07/31/2015  . Type 2 diabetes mellitus with hyperglycemia, with long-term current use of insulin ([place]) 04/03/2015  . Hyperlipidemia 04/03/2015  . Laboratory animal allergy 04/03/2015  . Cephalalgia 04/03/2015  . Essential hypertension 04/03/2015  . Blood glucose elevated 04/03/2015  . Routine general medical examination at a health care facility 04/03/2015  . CAD (coronary artery disease) 04/03/2015  . Dermatochalasis of both upper eyelids 09/20/2014  . Sleep apnea 09/03/2014  . DDD (degenerative disc disease), lumbosacral 03/03/2012  . Spinal stenosis of lumbar region without neurogenic claudication 03/03/2012  . Thoracic or lumbosacral neuritis or radiculitis, unspecified 03/03/2012    01-02-21: The patient requested for patient assistance applications to be completed for the medications Estring and Trulicity. Attempted to contact the patient to verify the two providers who prescribes the above medications before completing the applications. No answer at listed number. Left HIPAA compliant voicemail requesting a call back.   01-02-21 4:00pm: The patient returned my call verifying both providers for her patient assistance applications. Mee Hives, MD manages her Trulicity. Enos Fling, MD manages her Estring medication. Both patient assistance applications were prefilled.   Cloretta Ned, LPN Clinical Pharmacist Assistant  [phone]    Follow-Up:  Patient Assistance Coordination

## 2021-01-22 ENCOUNTER — Ambulatory Visit: Payer: Self-pay | Admitting: Family Medicine

## 2021-01-22 NOTE — Telephone Encounter (Signed)
Patient reports "Sharp pains in right eye last night."States saw halo around lights and blurred vision, right eye only. States developed headache later in night, relieved with tylenol. States presently "Just an ache in that eye, vision just slightly blurred."   Denies headache. No dizziness. H/O migraines, reports last one "Months ago." States "They are usually not like this."  No new medications. Agent made appt for 01/23/21 prior to triage. Disposition to be seen within 24hrs. Did advise pt if occurs again to go to ED/UC. Pt assured NT would route to practice for Dr. Stevenson Clinch review. Pt verbalizes understanding.   Reason for Disposition . Eye pain present > 24 hours    Pain mild now "Ache"  Answer Assessment - Initial Assessment Questions 1. ONSET: "When did the pain start?" (e.g., minutes, hours, days)     Yesterday, very mild "Ache" presently 2. TIMING: "Does the pain come and go, or has it been constant since it started?" (e.g., constant, intermittent, fleeting)     Comes and goes 3. SEVERITY: "How bad is the pain?"   (Scale 1-10; mild, moderate or severe)   - MILD (1-3): doesn't interfere with normal activities    - MODERATE (4-7): interferes with normal activities or awakens from sleep    - SEVERE (8-10): excruciating pain and patient unable to do normal activities     "Ache" 4. LOCATION: "Where does it hurt?"  (e.g., eyelid, eye, cheekbone)     Right eye only 5. CAUSE: "What do you think is causing the pain?"     Unsure, H/O migraines 6. VISION: "Do you have blurred vision or changes in your vision?"      Right only was very blurry yesterday, slight today. Saw halo last night around lights. 7. EYE DISCHARGE: "Is there any discharge (pus) from the eye(s)?"  If yes, ask: "What color is it?"      No 8. FEVER: "Do you have a fever?" If Yes, ask: "What is it, how was it measured, and when did it start?"      No 9. OTHER SYMPTOMS: "Do you have any other symptoms?" (e.g., headache, nasal  discharge, facial rash)     Headache yesterday with nausea,right side and earache.Headache relieved with tylenol last night.  Protocols used: EYE PAIN-A-AH

## 2021-01-23 ENCOUNTER — Ambulatory Visit: Payer: Medicare Other | Admitting: Family Medicine

## 2021-01-23 NOTE — Telephone Encounter (Signed)
Called with contact eye doctor and see if she can get in today for evaluation. We will keep her on schedule, unless she can get in with them first

## 2021-01-28 DIAGNOSIS — H21233 Degeneration of iris (pigmentary), bilateral: Secondary | ICD-10-CM | POA: Diagnosis not present

## 2021-01-28 DIAGNOSIS — H2513 Age-related nuclear cataract, bilateral: Secondary | ICD-10-CM | POA: Diagnosis not present

## 2021-01-28 DIAGNOSIS — E119 Type 2 diabetes mellitus without complications: Secondary | ICD-10-CM | POA: Diagnosis not present

## 2021-01-28 DIAGNOSIS — H40013 Open angle with borderline findings, low risk, bilateral: Secondary | ICD-10-CM | POA: Diagnosis not present

## 2021-01-28 LAB — HM DIABETES EYE EXAM

## 2021-02-21 ENCOUNTER — Other Ambulatory Visit: Payer: Self-pay | Admitting: Family Medicine

## 2021-02-21 DIAGNOSIS — I10 Essential (primary) hypertension: Secondary | ICD-10-CM

## 2021-03-05 DIAGNOSIS — H4089 Other specified glaucoma: Secondary | ICD-10-CM | POA: Diagnosis not present

## 2021-03-05 DIAGNOSIS — H21233 Degeneration of iris (pigmentary), bilateral: Secondary | ICD-10-CM | POA: Diagnosis not present

## 2021-03-05 DIAGNOSIS — E119 Type 2 diabetes mellitus without complications: Secondary | ICD-10-CM | POA: Diagnosis not present

## 2021-03-05 DIAGNOSIS — H2513 Age-related nuclear cataract, bilateral: Secondary | ICD-10-CM | POA: Diagnosis not present

## 2021-03-09 DIAGNOSIS — H2513 Age-related nuclear cataract, bilateral: Secondary | ICD-10-CM | POA: Insufficient documentation

## 2021-03-09 DIAGNOSIS — H21233 Degeneration of iris (pigmentary), bilateral: Secondary | ICD-10-CM | POA: Insufficient documentation

## 2021-03-09 DIAGNOSIS — E119 Type 2 diabetes mellitus without complications: Secondary | ICD-10-CM | POA: Insufficient documentation

## 2021-03-09 HISTORY — DX: Age-related nuclear cataract, bilateral: H25.13

## 2021-03-17 ENCOUNTER — Telehealth: Payer: Self-pay

## 2021-03-17 DIAGNOSIS — E1159 Type 2 diabetes mellitus with other circulatory complications: Secondary | ICD-10-CM | POA: Diagnosis not present

## 2021-03-17 DIAGNOSIS — E785 Hyperlipidemia, unspecified: Secondary | ICD-10-CM | POA: Diagnosis not present

## 2021-03-17 DIAGNOSIS — I152 Hypertension secondary to endocrine disorders: Secondary | ICD-10-CM | POA: Diagnosis not present

## 2021-03-17 DIAGNOSIS — E1142 Type 2 diabetes mellitus with diabetic polyneuropathy: Secondary | ICD-10-CM | POA: Diagnosis not present

## 2021-03-17 DIAGNOSIS — E1169 Type 2 diabetes mellitus with other specified complication: Secondary | ICD-10-CM | POA: Diagnosis not present

## 2021-03-17 LAB — BASIC METABOLIC PANEL
BUN: 17 (ref 4–21)
Creatinine: 0.8 (ref 0.5–1.1)
Glucose: 158
Potassium: 4.9 (ref 3.4–5.3)
Sodium: 139 (ref 137–147)

## 2021-03-17 LAB — LIPID PANEL
Cholesterol: 125 (ref 0–200)
HDL: 38 (ref 35–70)
LDL Cholesterol: 46
Triglycerides: 200 — AB (ref 40–160)

## 2021-03-17 LAB — HEPATIC FUNCTION PANEL
ALT: 21 (ref 7–35)
AST: 18 (ref 13–35)
Alkaline Phosphatase: 39 (ref 25–125)

## 2021-03-17 LAB — HEMOGLOBIN A1C: Hemoglobin A1C: 7.6

## 2021-03-17 LAB — MICROALBUMIN, URINE: Microalb, Ur: 48.4

## 2021-03-17 NOTE — Chronic Care Management (AMB) (Signed)
Chronic Care Management Pharmacy Assistant   Name: Nicole Cook  MRN: [id number] DOB: Jan 14, 1951   Reason for Encounter: Disease State-General     Recent office visits:  11/21/2020- Otilio Miu, MD (PCP)  Recent consult visits:  03/17/21-Thomas Honor Junes, MD(Endocrinology) increased trulicity from 8.46 to 1.5 mg weekly 11/15/2020- Mee Hives, MD(Endocrinology)  Hospital visits:  None in previous 6 months  Medications: Outpatient Encounter Medications as of 03/17/2021  Medication Sig Note  . ACCU-CHEK FASTCLIX LANCETS MISC Accu-Chek [employer]   . amLODipine (NORVASC) 5 MG tablet Take 1 tablet (5 mg total) by mouth daily.   Marland Kitchen aspirin 81 MG tablet Take 1 tablet by mouth daily.   . cetirizine (ZYRTEC) 10 MG tablet Take 10 mg by mouth daily. otc   . Cholecalciferol (VITAMIN D3) 1000 units CAPS Take 1 capsule by mouth daily.   . Coenzyme Q10 300 MG CAPS Take 1 capsule by mouth daily.   . Dulaglutide 0.75 MG/0.5ML SOPN Inject 1 each into the skin once a week. Dr Eddie Dibbles  .Marland KitchenSunday   . estradiol (ESTRING) 2 MG vaginal ring Place 2 mg vaginally every 3 (three) months. follow package directions- GYN   . fluticasone (FLONASE) 50 MCG/ACT nasal spray Place 2 sprays into both nostrils at bedtime. One spray each nostril daily   . gabapentin (NEURONTIN) 300 MG capsule Take 2 capsules by mouth 3 (three) times daily. [facility]- may take 3 capsules 3 times per day per pt   . hydrochlorothiazide (HYDRODIURIL) 25 MG tablet Take 1 tablet (25 mg total) by mouth every morning.   Marland Kitchen HYDROcodone-acetaminophen (NORCO) 10-325 MG per tablet Take 1 tablet by mouth as needed. [facility] Gets 40 tabs monthly   . insulin lispro (HUMALOG) 100 UNIT/ML KwikPen Humalog KwikPen (U-100) Insulin 100 unit/mL subcutaneous  INJECT 2-8 UNITS BEFORE MEALS AS DIRECTED (Patient not taking: Reported on 12/25/2020) 12/25/2020: Patient uses only as needed when she receives steroid injection   . lisinopril  (ZESTRIL) 10 MG tablet Take 1 tablet (10 mg total) by mouth daily.   . Magnesium 500 MG CAPS Take 1 capsule by mouth daily.   . metFORMIN (GLUCOPHAGE) 1000 MG tablet 1 tablet 2 (two) times daily.   . metoprolol succinate (TOPROL-XL) 25 MG 24 hr tablet TAKE 1 TABLET DAILY (TAKE WITH 50 MG TO EQUAL 75)   . metoprolol succinate (TOPROL-XL) 50 MG 24 hr tablet TAKE WITH OR IMMEDIATELY FOLLOWING A MEAL   . montelukast (SINGULAIR) 10 MG tablet Take 1 tablet (10 mg total) by mouth at bedtime.   . Multiple Vitamins-Minerals (CENTRUM SILVER ULTRA WOMENS PO) Take 1 tablet by mouth daily.   . nitroGLYCERIN (NITROSTAT) 0.4 MG SL tablet Place 1 tablet under the tongue as needed.   Marland Kitchen olopatadine (PATANOL) 0.1 % ophthalmic solution Place 1 drop into both eyes 2 (two) times daily.   . ondansetron (ZOFRAN) 4 MG tablet Take 1 tablet (4 mg total) by mouth every 8 (eight) hours as needed for nausea or vomiting.   . ONE TOUCH ULTRA TEST test strip    . OXYCONTIN 10 MG 12 hr tablet Take 10 mg by mouth 2 (two) times daily. Pain Dr   . pantoprazole (PROTONIX) 40 MG tablet Take 1 tablet (40 mg total) by mouth daily.   . rosuvastatin (CRESTOR) 20 MG tablet TAKE 1 TABLET DAILY   . SUMAtriptan (IMITREX) 50 MG tablet Take 1 tablet (50 mg total) by mouth as needed. Per 90 days   .  tiZANidine (ZANAFLEX) 4 MG tablet tizanidine 4 mg tablet  TAKE 1-2 TABLETS BY MOUTH AT NIGHT AT BEDTIME   . vitamin B-12 (CYANOCOBALAMIN) 1000 MCG tablet Take 1,000 mcg by mouth daily.    No facility-administered encounter medications on file as of 03/17/2021.    Have you had any problems recently with your health?  Patient states she is not having any problems at this time with her health.  Have you had any problems with your pharmacy? Patient states she does not have any problems with her pharmacy.  What issues or side effects are you having with your medications? Patient states she experiences increased gas with the metformin bur she states  she knows this is a common side effect..  What would you like me to pass along to [facility] for them to help you with?  Patient states she had misplaced the PAP packet but found it recently and will fill the information out as soon as possible.  What can we do to take care of you better? Patient states there is nothing at this time.  Star Rating Drugs: Lisinopril 10 mg Take 1 tablet daily last filled 01/28/21 90DS Metformin 1000 mg Take 1 tablet two times daily last filled 12/02/2020 90DS Rosuvastatin 20 mg Take 1 tablet daily last filled [id number]    Trulicity was increased on 03/17/21 by Dr.O'Connell from 0.75 to 1.5 mg   [place] Pharmacist Assistant [phone]

## 2021-04-15 ENCOUNTER — Telehealth: Payer: Self-pay | Admitting: Pharmacist

## 2021-04-15 NOTE — Telephone Encounter (Signed)
Patient assistance applications for Estring and Trulicity faxed to Dr. Donney Rankins and Dr. Honor Junes, respectively, for completion and submission to program. Will ask CPA to follow -up in 2-4 weeks.

## 2021-04-15 NOTE — Chronic Care Management (AMB) (Signed)
Chronic Care Management Pharmacy Assistant   Name: Nicole Cook  MRN: [id number] DOB: [birth year]   Reason for Encounter: Disease State DM    Recent office visits:  None noted  Recent consult visits:  None noted  Hospital visits:  None in previous 6 months  Medications: Outpatient Encounter Medications as of 04/15/2021  Medication Sig Note  . ACCU-CHEK FASTCLIX LANCETS MISC Accu-Chek [employer]   . amLODipine (NORVASC) 5 MG tablet Take 1 tablet (5 mg total) by mouth daily.   Marland Kitchen aspirin 81 MG tablet Take 1 tablet by mouth daily.   . cetirizine (ZYRTEC) 10 MG tablet Take 10 mg by mouth daily. otc   . Cholecalciferol (VITAMIN D3) 1000 units CAPS Take 1 capsule by mouth daily.   . Coenzyme Q10 300 MG CAPS Take 1 capsule by mouth daily.   . Dulaglutide 0.75 MG/0.5ML SOPN Inject 1 each into the skin once a week. Dr Eddie Dibbles  .Marland KitchenSunday   . estradiol (ESTRING) 2 MG vaginal ring Place 2 mg vaginally every 3 (three) months. follow package directions- GYN   . fluticasone (FLONASE) 50 MCG/ACT nasal spray Place 2 sprays into both nostrils at bedtime. One spray each nostril daily   . gabapentin (NEURONTIN) 300 MG capsule Take 2 capsules by mouth 3 (three) times daily. [facility]- may take 3 capsules 3 times per day per pt   . hydrochlorothiazide (HYDRODIURIL) 25 MG tablet Take 1 tablet (25 mg total) by mouth every morning.   Marland Kitchen HYDROcodone-acetaminophen (NORCO) 10-325 MG per tablet Take 1 tablet by mouth as needed. [facility] Gets 40 tabs monthly   . insulin lispro (HUMALOG) 100 UNIT/ML KwikPen Humalog KwikPen (U-100) Insulin 100 unit/mL subcutaneous  INJECT 2-8 UNITS BEFORE MEALS AS DIRECTED (Patient not taking: Reported on 12/25/2020) 12/25/2020: Patient uses only as needed when she receives steroid injection   . lisinopril (ZESTRIL) 10 MG tablet Take 1 tablet (10 mg total) by mouth daily.   . Magnesium 500 MG CAPS Take 1 capsule by mouth daily.   . metFORMIN (GLUCOPHAGE) 1000 MG tablet  1 tablet 2 (two) times daily.   . metoprolol succinate (TOPROL-XL) 25 MG 24 hr tablet TAKE 1 TABLET DAILY (TAKE WITH 50 MG TO EQUAL 75)   . metoprolol succinate (TOPROL-XL) 50 MG 24 hr tablet TAKE WITH OR IMMEDIATELY FOLLOWING A MEAL   . montelukast (SINGULAIR) 10 MG tablet Take 1 tablet (10 mg total) by mouth at bedtime.   . Multiple Vitamins-Minerals (CENTRUM SILVER ULTRA WOMENS PO) Take 1 tablet by mouth daily.   . nitroGLYCERIN (NITROSTAT) 0.4 MG SL tablet Place 1 tablet under the tongue as needed.   Marland Kitchen olopatadine (PATANOL) 0.1 % ophthalmic solution Place 1 drop into both eyes 2 (two) times daily.   . ondansetron (ZOFRAN) 4 MG tablet Take 1 tablet (4 mg total) by mouth every 8 (eight) hours as needed for nausea or vomiting.   . ONE TOUCH ULTRA TEST test strip    . OXYCONTIN 10 MG 12 hr tablet Take 10 mg by mouth 2 (two) times daily. Pain Dr   . pantoprazole (PROTONIX) 40 MG tablet Take 1 tablet (40 mg total) by mouth daily.   . rosuvastatin (CRESTOR) 20 MG tablet TAKE 1 TABLET DAILY   . SUMAtriptan (IMITREX) 50 MG tablet Take 1 tablet (50 mg total) by mouth as needed. Per 90 days   . tiZANidine (ZANAFLEX) 4 MG tablet tizanidine 4 mg tablet  TAKE 1-2 TABLETS BY MOUTH AT  NIGHT AT BEDTIME   . vitamin B-12 (CYANOCOBALAMIN) 1000 MCG tablet Take 1,000 mcg by mouth daily.    No facility-administered encounter medications on file as of 04/15/2021.   Recent Relevant Labs: Lab Results  Component Value Date/Time   HGBA1C 7.2 11/15/2020 12:00 AM   HGBA1C 6.8 05/03/2020 12:00 AM   MICROALBUR 42.5 05/03/2020 12:00 AM    Kidney Function Lab Results  Component Value Date/Time   CREATININE 0.80 11/21/2020 10:00 AM   CREATININE 0.79 05/21/2020 09:53 AM   GFRNONAA 74 11/21/2020 10:00 AM   GFRAA 86 11/21/2020 10:00 AM    . Current antihyperglycemic regimen:   Metformin 1000 mg Take 1 tablet two times daily  Trulicity 1.5mg  once a week  . What recent interventions/DTPs have been made to improve  glycemic control:  o None  . Have there been any recent hospitalizations or ED visits since last visit with CPP? No   . Patient reports hypoglycemic symptoms, including Shaky at times.   . Patient denies hyperglycemic symptoms, including blurry vision, excessive thirst, fatigue, polyuria and weakness   . How often are you checking your blood sugar? twice daily   . What are your blood sugars ranging?  o Fasting: 160s o Before meals:  o After meals o Bedtime:   . During the week, how often does your blood glucose drop below 70? Never   . Are you checking your feet daily/regularly?  o Patient states she checks her feet daily.  Adherence Review: Is the patient currently on a STATIN medication? Yes Is the patient currently on ACE/ARB medication? Yes Does the patient have >5 day gap between last estimated fill dates? No  Patient states she is experiencing nausea, stomach cramps and diarrhea from the increase dose of trulicty. Patient states she has also had an increase in migraines.   Star Rating Drugs: Dulaglutide 0.75mg /0.61ml Last filled:03/17/2021 84DS Lisinopril 10 mg Last filled:01/28/2021 90DS Metformin 1000mg  Last filled: 03/02/2021 90DS Rosuvastatin 20mg  Last filled:02/20/2021 90 DS   [facility] Clinical Pharmacist Assistant [phone]

## 2021-05-07 ENCOUNTER — Encounter: Payer: Self-pay | Admitting: Family Medicine

## 2021-05-07 ENCOUNTER — Other Ambulatory Visit: Payer: Self-pay

## 2021-05-07 ENCOUNTER — Telehealth: Payer: Self-pay

## 2021-05-07 ENCOUNTER — Ambulatory Visit (INDEPENDENT_AMBULATORY_CARE_PROVIDER_SITE_OTHER): Payer: Medicare Other | Admitting: Family Medicine

## 2021-05-07 VITALS — BP 110/80 | HR 68 | Ht 65.0 in | Wt 167.0 lb

## 2021-05-07 DIAGNOSIS — I10 Essential (primary) hypertension: Secondary | ICD-10-CM

## 2021-05-07 DIAGNOSIS — J301 Allergic rhinitis due to pollen: Secondary | ICD-10-CM

## 2021-05-07 DIAGNOSIS — K219 Gastro-esophageal reflux disease without esophagitis: Secondary | ICD-10-CM | POA: Diagnosis not present

## 2021-05-07 DIAGNOSIS — G43109 Migraine with aura, not intractable, without status migrainosus: Secondary | ICD-10-CM

## 2021-05-07 DIAGNOSIS — R112 Nausea with vomiting, unspecified: Secondary | ICD-10-CM | POA: Diagnosis not present

## 2021-05-07 DIAGNOSIS — E785 Hyperlipidemia, unspecified: Secondary | ICD-10-CM | POA: Diagnosis not present

## 2021-05-07 MED ORDER — MONTELUKAST SODIUM 10 MG PO TABS: 10.0000 mg | ORAL_TABLET | Freq: Every day | ORAL | 1 refills | Status: DC

## 2021-05-07 MED ORDER — SUMATRIPTAN SUCCINATE 50 MG PO TABS
50.0000 mg | ORAL_TABLET | ORAL | 1 refills | Status: DC | PRN
Start: 2021-05-07 — End: 2021-10-23

## 2021-05-07 MED ORDER — METOPROLOL SUCCINATE ER 50 MG PO TB24: ORAL_TABLET | ORAL | 1 refills | Status: DC

## 2021-05-07 MED ORDER — METOPROLOL SUCCINATE ER 25 MG PO TB24: ORAL_TABLET | ORAL | 1 refills | Status: DC

## 2021-05-07 MED ORDER — PANTOPRAZOLE SODIUM 40 MG PO TBEC: 40.0000 mg | DELAYED_RELEASE_TABLET | Freq: Every day | ORAL | 1 refills | Status: DC

## 2021-05-07 MED ORDER — ROSUVASTATIN CALCIUM 20 MG PO TABS: ORAL_TABLET | ORAL | 1 refills | Status: DC

## 2021-05-07 MED ORDER — HYDROCHLOROTHIAZIDE 25 MG PO TABS
25.0000 mg | ORAL_TABLET | Freq: Every morning | ORAL | 1 refills | Status: DC
Start: 2021-05-07 — End: 2021-10-23

## 2021-05-07 MED ORDER — AMLODIPINE BESYLATE 5 MG PO TABS: 5.0000 mg | ORAL_TABLET | Freq: Every day | ORAL | 1 refills | Status: DC

## 2021-05-07 MED ORDER — FLUTICASONE PROPIONATE 50 MCG/ACT NA SUSP: 2.0000 | Freq: Every evening | NASAL | 1 refills | Status: DC

## 2021-05-07 MED ORDER — LISINOPRIL 10 MG PO TABS: 10.0000 mg | ORAL_TABLET | Freq: Every day | ORAL | 1 refills | Status: DC

## 2021-05-07 MED ORDER — ONDANSETRON HCL 4 MG PO TABS: 4.0000 mg | ORAL_TABLET | Freq: Three times a day (TID) | ORAL | 2 refills | Status: DC | PRN

## 2021-05-07 NOTE — Telephone Encounter (Signed)
Called pt and let her know she needs to get handicap placard from pain clinic or endo/ specialty

## 2021-05-07 NOTE — Progress Notes (Signed)
Date:  05/07/2021   Name:  Nicole Cook   DOB:  1951-04-19   MRN:  [id number]   Chief Complaint: Hyperlipidemia, Hypertension, Allergic Rhinitis , Gastroesophageal Reflux, and Headache  Hyperlipidemia This is a chronic problem. The current episode started more than 1 year ago. The problem is uncontrolled. Recent lipid tests were reviewed and are normal. She has no history of chronic renal disease, diabetes, hypothyroidism, liver disease, obesity or nephrotic syndrome. Pertinent negatives include no chest pain, focal sensory loss, focal weakness, leg pain, myalgias or shortness of breath. Current antihyperlipidemic treatment includes statins. The current treatment provides moderate improvement of lipids. There are no compliance problems.  There are no known risk factors for coronary artery disease.  Hypertension This is a chronic problem. The current episode started more than 1 year ago. The problem has been waxing and waning since onset. The problem is controlled. Associated symptoms include headaches. Pertinent negatives include no anxiety, blurred vision, chest pain, malaise/fatigue, neck pain, orthopnea, palpitations, peripheral edema, PND, shortness of breath or sweats. Risk factors for coronary artery disease include dyslipidemia. Past treatments include ACE inhibitors, calcium channel blockers, beta blockers and diuretics. The current treatment provides moderate improvement. There are no compliance problems.  There is no history of angina, kidney disease, CAD/MI, CVA, heart failure, left ventricular hypertrophy, PVD or retinopathy. There is no history of chronic renal disease, a hypertension causing med or renovascular disease.  Gastroesophageal Reflux She complains of heartburn. She reports no abdominal pain, no chest pain, no coughing, no dysphagia, no nausea, no sore throat or no wheezing. This is a chronic problem. The problem has been gradually improving. She has tried a PPI for the  symptoms.  Headache  This is a chronic problem. The current episode started more than 1 year ago. The problem occurs intermittently. The problem has been waxing and waning. Pertinent negatives include no abdominal pain, back pain, blurred vision, coughing, dizziness, ear pain, fever, nausea, neck pain or sore throat. Her past medical history is significant for hypertension. There is no history of obesity.    Lab Results  Component Value Date   CREATININE 0.8 03/17/2021   BUN 17 03/17/2021   NA 139 03/17/2021   K 4.9 03/17/2021   CL 98 11/21/2020   CO2 26 11/21/2020   Lab Results  Component Value Date   CHOL 125 03/17/2021   HDL 38 03/17/2021   LDLCALC 46 03/17/2021   TRIG 200 (A) 03/17/2021   CHOLHDL 3.3 03/09/2018   No results found for: TSH Lab Results  Component Value Date   HGBA1C 7.6 03/17/2021   Lab Results  Component Value Date   WBC 5.0 10/10/2018   HGB 12.4 10/10/2018   HCT 37.2 10/10/2018   MCV 86 10/10/2018   PLT 185 10/10/2018   Lab Results  Component Value Date   ALT 21 03/17/2021   AST 18 03/17/2021   ALKPHOS 39 03/17/2021   BILITOT 0.3 05/21/2020     Review of Systems  Constitutional: Negative for chills, fever and malaise/fatigue.  HENT: Negative for drooling, ear discharge, ear pain and sore throat.   Eyes: Negative for blurred vision.  Respiratory: Negative for cough, shortness of breath and wheezing.   Cardiovascular: Negative for chest pain, palpitations, orthopnea, leg swelling and PND.  Gastrointestinal: Positive for heartburn. Negative for abdominal pain, blood in stool, constipation, diarrhea, dysphagia and nausea.  Endocrine: Negative for polydipsia.  Genitourinary: Negative for dysuria, frequency, hematuria and urgency.  Musculoskeletal: Negative for  back pain, myalgias and neck pain.  Skin: Negative for rash.  Allergic/Immunologic: Negative for environmental allergies.  Neurological: Positive for headaches. Negative for dizziness and  focal weakness.  Hematological: Does not bruise/bleed easily.  Psychiatric/Behavioral: Negative for suicidal ideas. The patient is not nervous/anxious.     Patient Active Problem List   Diagnosis Date Noted  . Hyperlipidemia associated with type 2 diabetes mellitus ([place]) 12/25/2019  . Atrophic vaginitis 11/23/2018  . Pelvic floor relaxation 11/23/2018  . Primary osteoarthritis of right knee 05/12/2018  . Sacral back pain 08/04/2017  . Taking medication for chronic disease 03/02/2017  . Seasonal allergic rhinitis due to pollen 03/02/2017  . Gastroesophageal reflux disease 03/02/2017  . Migraine with aura and without status migrainosus, not intractable 03/02/2017  . Angina pectoris ([place]) 11/09/2016  . Chronic right-sided low back pain without sciatica 10/29/2016  . Trochanteric bursitis of right hip 07/31/2015  . Primary osteoarthritis of right hip 07/31/2015  . Type 2 diabetes mellitus with hyperglycemia, with long-term current use of insulin ([place]) 04/03/2015  . Hyperlipidemia 04/03/2015  . Laboratory animal allergy 04/03/2015  . Cephalalgia 04/03/2015  . Essential hypertension 04/03/2015  . Blood glucose elevated 04/03/2015  . Routine general medical examination at a health care facility 04/03/2015  . CAD (coronary artery disease) 04/03/2015  . Dermatochalasis of both upper eyelids 09/20/2014  . Sleep apnea 09/03/2014  . DDD (degenerative disc disease), lumbosacral 03/03/2012  . Spinal stenosis of lumbar region without neurogenic claudication 03/03/2012  . Thoracic or lumbosacral neuritis or radiculitis, unspecified 03/03/2012    Allergies  Allergen Reactions  . Aspartame Other (See Comments)  . Compazine [Prochlorperazine]   . Penicillins   . Sulfa Antibiotics   . Sulfamethoxazole-Trimethoprim Hives and Other (See Comments)    Other Reaction: Not Assessed     Past Surgical History:  Procedure Laterality Date  . back surgery x 2    . c-section x 3    . [place]  . COLONOSCOPY  2011   cleared for 5 yrs- Dr Beckey Downing  . EYE SURGERY     eyelid lesion  . KNEE ARTHROSCOPY Right 2010  . SPINE SURGERY  2003,2006  . TUBAL LIGATION  1982  . VAGINAL HYSTERECTOMY  12/14/1994    Social History   Tobacco Use  . Smoking status: Never Smoker  . Smokeless tobacco: Never Used  Vaping Use  . Vaping Use: Never used  Substance Use Topics  . Alcohol use: No    Alcohol/week: 0.0 standard drinks  . Drug use: No     Medication list has been reviewed and updated.  Current Meds  Medication Sig  . ACCU-CHEK FASTCLIX LANCETS MISC Accu-Chek [employer]  . amLODipine (NORVASC) 5 MG tablet Take 1 tablet (5 mg total) by mouth daily.  Marland Kitchen aspirin 81 MG tablet Take 1 tablet by mouth daily.  . cetirizine (ZYRTEC) 10 MG tablet Take 10 mg by mouth daily. otc  . Cholecalciferol (VITAMIN D3) 1000 units CAPS Take 1 capsule by mouth daily.  . Coenzyme Q10 300 MG CAPS Take 1 capsule by mouth daily.  . Dulaglutide 1.5 MG/0.5ML SOPN Inject 1 each into the skin once a week. endo .Marland KitchenSunday  . estradiol (ESTRING) 2 MG vaginal ring Place 2 mg vaginally every 3 (three) months. follow package directions- GYN  . fluticasone (FLONASE) 50 MCG/ACT nasal spray Place 2 sprays into both nostrils at bedtime. One spray each nostril daily  . gabapentin (NEURONTIN) 300 MG capsule  Take 2 capsules by mouth 3 (three) times daily. [facility]- may take 3 capsules 3 times per day per pt  . hydrochlorothiazide (HYDRODIURIL) 25 MG tablet Take 1 tablet (25 mg total) by mouth every morning.  Marland Kitchen HYDROcodone-acetaminophen (NORCO) 10-325 MG per tablet Take 1 tablet by mouth as needed. [facility] Gets 40 tabs monthly  . insulin lispro (HUMALOG) 100 UNIT/ML KwikPen   . lisinopril (ZESTRIL) 10 MG tablet Take 1 tablet (10 mg total) by mouth daily.  . Magnesium 500 MG CAPS Take 1 capsule by mouth daily.  . metFORMIN (GLUCOPHAGE) 1000 MG tablet 1 tablet 2 (two) times daily.  .  metoprolol succinate (TOPROL-XL) 25 MG 24 hr tablet TAKE 1 TABLET DAILY (TAKE WITH 50 MG TO EQUAL 75)  . metoprolol succinate (TOPROL-XL) 50 MG 24 hr tablet TAKE WITH OR IMMEDIATELY FOLLOWING A MEAL  . montelukast (SINGULAIR) 10 MG tablet Take 1 tablet (10 mg total) by mouth at bedtime.  . Multiple Vitamins-Minerals (CENTRUM SILVER ULTRA WOMENS PO) Take 1 tablet by mouth daily.  . nitroGLYCERIN (NITROSTAT) 0.4 MG SL tablet Place 1 tablet under the tongue as needed.  Marland Kitchen olopatadine (PATANOL) 0.1 % ophthalmic solution Place 1 drop into both eyes 2 (two) times daily.  . ondansetron (ZOFRAN) 4 MG tablet Take 1 tablet (4 mg total) by mouth every 8 (eight) hours as needed for nausea or vomiting.  . ONE TOUCH ULTRA TEST test strip   . OXYCONTIN 10 MG 12 hr tablet Take 10 mg by mouth 2 (two) times daily. Pain Dr  . pantoprazole (PROTONIX) 40 MG tablet Take 1 tablet (40 mg total) by mouth daily.  . rosuvastatin (CRESTOR) 20 MG tablet TAKE 1 TABLET DAILY  . SUMAtriptan (IMITREX) 50 MG tablet Take 1 tablet (50 mg total) by mouth as needed. Per 90 days  . tiZANidine (ZANAFLEX) 4 MG tablet tizanidine 4 mg tablet  TAKE 1-2 TABLETS BY MOUTH AT NIGHT AT BEDTIME  . vitamin B-12 (CYANOCOBALAMIN) 1000 MCG tablet Take 1,000 mcg by mouth daily.    PHQ 2/9 Scores 05/07/2021 12/25/2020 11/21/2020 05/21/2020  PHQ - 2 Score 0 0 0 0  PHQ- 9 Score 0 - 0 0    GAD 7 : Generalized Anxiety Score 05/07/2021 11/21/2020 05/21/2020  Nervous, Anxious, on Edge 0 0 0  Control/stop worrying 0 0 0  Worry too much - different things 0 0 0  Trouble relaxing 0 0 0  Restless 0 0 0  Easily annoyed or irritable 0 0 0  Afraid - awful might happen 0 0 0  Total GAD 7 Score 0 0 0    BP Readings from Last 3 Encounters:  05/07/21 110/80  11/21/20 130/70  05/21/20 120/60    Physical Exam Vitals and nursing note reviewed.  Constitutional:      Appearance: She is well-developed.  HENT:     Head: Normocephalic.     Right Ear: External  ear normal.     Left Ear: External ear normal.     Mouth/Throat:     Mouth: Mucous membranes are moist.  Eyes:     General: Lids are everted, no foreign bodies appreciated. No scleral icterus.       Left eye: No foreign body or hordeolum.     Extraocular Movements: Extraocular movements intact.     Conjunctiva/sclera: Conjunctivae normal.     Right eye: Right conjunctiva is not injected.     Left eye: Left conjunctiva is not injected.  Pupils: Pupils are equal, round, and reactive to light.  Neck:     Thyroid: No thyromegaly.     Vascular: No JVD.     Trachea: No tracheal deviation.  Cardiovascular:     Rate and Rhythm: Normal rate and regular rhythm.     Heart sounds: Normal heart sounds. No murmur heard. No friction rub. No gallop.   Pulmonary:     Effort: Pulmonary effort is normal. No respiratory distress.     Breath sounds: Normal breath sounds. No wheezing, rhonchi or rales.  Abdominal:     General: Bowel sounds are normal.     Palpations: Abdomen is soft. There is no mass.     Tenderness: There is no abdominal tenderness. There is no guarding or rebound.  Musculoskeletal:        General: No tenderness. Normal range of motion.     Cervical back: Normal range of motion and neck supple.  Lymphadenopathy:     Cervical: No cervical adenopathy.  Skin:    General: Skin is warm.     Findings: No rash.  Neurological:     Mental Status: She is alert and oriented to person, place, and time.     Cranial Nerves: No cranial nerve deficit.     Deep Tendon Reflexes: Reflexes normal.  Psychiatric:        Mood and Affect: Mood is not anxious or depressed.     Wt Readings from Last 3 Encounters:  05/07/21 167 lb (75.8 kg)  11/21/20 170 lb (77.1 kg)  05/21/20 166 lb (75.3 kg)    BP 110/80   Pulse 68   Ht 5\' 5"  (1.651 m)   Wt 167 lb (75.8 kg)   BMI 27.79 kg/m   Assessment and Plan:  1. Essential hypertension Chronic.  Controlled.  Stable.  Blood pressure today is  110/80.  Continue metoprolol XL 25 and 50 combined for 75 mg total daily hydrochlorothiazide 25 mg once a day, lisinopril 10 mg once a day, and amlodipine 5 mg once a day.  Review of patient's renal function panel was unremarkable and we will recheck in 6 months. - amLODipine (NORVASC) 5 MG tablet; Take 1 tablet (5 mg total) by mouth daily.  Dispense: 90 tablet; Refill: 1 - hydrochlorothiazide (HYDRODIURIL) 25 MG tablet; Take 1 tablet (25 mg total) by mouth every morning.  Dispense: 90 tablet; Refill: 1 - lisinopril (ZESTRIL) 10 MG tablet; Take 1 tablet (10 mg total) by mouth daily.  Dispense: 90 tablet; Refill: 1 - metoprolol succinate (TOPROL-XL) 25 MG 24 hr tablet; TAKE 1 TABLET DAILY (TAKE WITH 50 MG TO EQUAL 75)  Dispense: 90 tablet; Refill: 1 - metoprolol succinate (TOPROL-XL) 50 MG 24 hr tablet; Take with or immediately following a meal.  Dispense: 90 tablet; Refill: 1  2. Seasonal allergic rhinitis due to pollen Chronic.  Controlled.  Stable.  Continue fluticasone and Singulair once a day. - fluticasone (FLONASE) 50 MCG/ACT nasal spray; Place 2 sprays into both nostrils at bedtime. One spray each nostril daily  Dispense: 16 g; Refill: 1 - montelukast (SINGULAIR) 10 MG tablet; Take 1 tablet (10 mg total) by mouth at bedtime.  Dispense: 90 tablet; Refill: 1  3. Migraine with aura and without status migrainosus, not intractable Chronic.  Controlled.  Stable.  Continue Zofran and Imitrex on an as-needed basis. - ondansetron (ZOFRAN) 4 MG tablet; Take 1 tablet (4 mg total) by mouth every 8 (eight) hours as needed for nausea or vomiting.  Dispense: 20  tablet; Refill: 2 - SUMAtriptan (IMITREX) 50 MG tablet; Take 1 tablet (50 mg total) by mouth as needed. Per 90 days  Dispense: 10 tablet; Refill: 1  4. Nausea and vomiting, intractability of vomiting not specified, unspecified vomiting type .  Controlled.  Stable.  Continue Zofran on a as needed basis for nausea. - ondansetron (ZOFRAN) 4 MG tablet;  Take 1 tablet (4 mg total) by mouth every 8 (eight) hours as needed for nausea or vomiting.  Dispense: 20 tablet; Refill: 2  5. Gastroesophageal reflux disease, unspecified whether esophagitis present Chronic.  Controlled.  Stable.  Continue pantoprazole 40 mg once a day. - pantoprazole (PROTONIX) 40 MG tablet; Take 1 tablet (40 mg total) by mouth daily.  Dispense: 90 tablet; Refill: 1  6. Hyperlipidemia, unspecified hyperlipidemia type Chronic.  Controlled.  Stable.  View of patient's lipid panel is excellent.  Continue rosuvastatin 20 mg once a day. - rosuvastatin (CRESTOR) 20 MG tablet; TAKE 1 TABLET DAILY  Dispense: 90 tablet; Refill: 1

## 2021-05-13 ENCOUNTER — Ambulatory Visit
Admission: RE | Admit: 2021-05-13 | Discharge: 2021-05-13 | Disposition: A | Discharge status: Home / Self Care | Payer: Medicare Other | Source: Ambulatory Visit | Attending: Family Medicine | Admitting: Family Medicine

## 2021-05-13 ENCOUNTER — Other Ambulatory Visit: Payer: Self-pay

## 2021-05-13 DIAGNOSIS — Z1231 Encounter for screening mammogram for malignant neoplasm of breast: Secondary | ICD-10-CM | POA: Insufficient documentation

## 2021-05-14 ENCOUNTER — Other Ambulatory Visit: Payer: Self-pay | Admitting: Family Medicine

## 2021-05-14 ENCOUNTER — Telehealth: Payer: Self-pay

## 2021-05-14 DIAGNOSIS — R928 Other abnormal and inconclusive findings on diagnostic imaging of breast: Secondary | ICD-10-CM

## 2021-05-14 NOTE — Telephone Encounter (Signed)
Copied from [place] [phone]. Topic: General - Other >> May 14, 2021  3:05 PM Erick Blinks wrote: Reason for CRM: Pt called requesting to speak to Baxter Flattery concerning her mammogram   Best contact: [phone] or cell [phone]

## 2021-05-21 ENCOUNTER — Other Ambulatory Visit: Payer: Self-pay

## 2021-05-21 ENCOUNTER — Ambulatory Visit: Payer: Medicare Other

## 2021-05-21 ENCOUNTER — Ambulatory Visit
Admission: RE | Admit: 2021-05-21 | Discharge: 2021-05-21 | Disposition: A | Discharge status: Home / Self Care | Payer: Medicare Other | Source: Ambulatory Visit | Attending: Family Medicine | Admitting: Family Medicine

## 2021-05-21 DIAGNOSIS — R922 Inconclusive mammogram: Secondary | ICD-10-CM | POA: Diagnosis not present

## 2021-05-21 DIAGNOSIS — R928 Other abnormal and inconclusive findings on diagnostic imaging of breast: Secondary | ICD-10-CM | POA: Diagnosis not present

## 2021-05-22 ENCOUNTER — Ambulatory Visit (INDEPENDENT_AMBULATORY_CARE_PROVIDER_SITE_OTHER): Payer: Medicare Other | Admitting: Pharmacist

## 2021-05-22 DIAGNOSIS — E119 Type 2 diabetes mellitus without complications: Secondary | ICD-10-CM | POA: Diagnosis not present

## 2021-05-22 DIAGNOSIS — I10 Essential (primary) hypertension: Secondary | ICD-10-CM | POA: Diagnosis not present

## 2021-05-22 NOTE — Progress Notes (Signed)
Chronic Care Management Pharmacy Note  05/22/2021 Name:  Nicole Cook MRN:  [id number] DOB:  05/08/1951  Subjective: Nicole Cook is an 70 y.o. year old female who is a primary patient of Duanne Limerick, MD.  The CCM team was consulted for assistance with disease management and care coordination needs.    Engaged with patient by telephone for follow up visit in response to provider referral for pharmacy case management and/or care coordination services.   Consent to Services:  The patient was given information about Chronic Care Management services, agreed to services, and gave verbal consent prior to initiation of services.  Please see initial visit note for detailed documentation.   Patient Care Team: Duanne Limerick, MD as PCP - General (Family Medicine) Laretta Bolster, MD as Consulting Physician (Cardiology) Venita Sheffield, MD as Consulting Physician (Occupational Medicine) Doyle Askew, MD as Consulting Physician (Obstetrics and Gynecology) Lajean Manes, [facility] (Pharmacist) Sherlon Handing, MD as Consulting Physician (Internal Medicine)  Recent office visits: 05/07/21-Jones (PCP)-blood work, toprol xl 75 mg qd    Recent consult visits: 6/08/22[facility] radiology-  left Mammogram- benign 5/31/22[facility] screening Mammogram, left breast possible distortion 04/17/21-O'Connell (endo) sent in PAP for Trulicity 03/17/21- O'connell(endo)  Hospital visits: None in previous 6 months   Objective:  Lab Results  Component Value Date   CREATININE 0.8 03/17/2021   BUN 17 03/17/2021   GFRNONAA 74 11/21/2020   GFRAA 86 11/21/2020   NA 139 03/17/2021   K 4.9 03/17/2021   CALCIUM 9.6 11/21/2020   CO2 26 11/21/2020   GLUCOSE 150 (H) 11/21/2020    Lab Results  Component Value Date/Time   HGBA1C 7.6 03/17/2021 12:00 AM   HGBA1C 7.2 11/15/2020 12:00 AM   MICROALBUR 48.4 03/17/2021 12:00 AM   MICROALBUR 42.5 05/03/2020 12:00 AM    Last diabetic Eye exam:  Lab  Results  Component Value Date/Time   HMDIABEYEEXA No Retinopathy 01/28/2021 12:00 AM    Last diabetic Foot exam:  Lab Results  Component Value Date/Time   HMDIABFOOTEX normal 05/03/2020 12:00 AM     Lab Results  Component Value Date   CHOL 125 03/17/2021   HDL 38 03/17/2021   LDLCALC 46 03/17/2021   TRIG 200 (A) 03/17/2021   CHOLHDL 3.3 03/09/2018    Hepatic Function Latest Ref Rng & Units 03/17/2021 11/21/2020 05/21/2020  Total Protein 6.0 - 8.5 g/dL - - 6.9  Albumin 3.7 - 4.7 g/dL - 4.6 4.7  AST 13 - 35 18 - 18  ALT 7 - 35 21 - 17  Alk Phosphatase 25 - 125 39 - 45(L)  Total Bilirubin 0.0 - 1.2 mg/dL - - 0.3  Bilirubin, Direct 0.00 - 0.40 mg/dL - - 6.70    No results found for: TSH, FREET4  CBC Latest Ref Rng & Units 10/10/2018  WBC 3.4 - 10.8 x10E3/uL 5.0  Hemoglobin 11.1 - 15.9 g/dL 39.3  Hematocrit 38.4 - 46.6 % 37.2  Platelets 150 - 450 x10E3/uL 185    No results found for: VD25OH  Clinical ASCVD: Yes  The ASCVD Risk score Denman George DC Jr., et al., 2013) failed to calculate for the following reasons:   The valid total cholesterol range is 130 to 320 mg/dL    Depression screen [facility] 2/9 05/07/2021 12/25/2020 11/21/2020  Decreased Interest 0 0 0  Down, Depressed, Hopeless 0 0 0  PHQ - 2 Score 0 0 0  Altered sleeping 0 - 0  Tired, decreased  energy 0 - 0  Change in appetite 0 - 0  Feeling bad or failure about yourself  0 - 0  Trouble concentrating 0 - 0  Moving slowly or fidgety/restless 0 - 0  Suicidal thoughts 0 - 0  PHQ-9 Score 0 - 0  Some recent data might be hidden      Social History   Tobacco Use  Smoking Status Never  Smokeless Tobacco Never   BP Readings from Last 3 Encounters:  05/07/21 110/80  11/21/20 130/70  05/21/20 120/60   Pulse Readings from Last 3 Encounters:  05/07/21 68  11/21/20 76  05/21/20 68   Wt Readings from Last 3 Encounters:  05/07/21 167 lb (75.8 kg)  11/21/20 170 lb (77.1 kg)  05/21/20 166 lb (75.3 kg)   BMI Readings  from Last 3 Encounters:  05/07/21 27.79 kg/m  11/21/20 28.29 kg/m  05/21/20 27.62 kg/m    Assessment/Interventions: Review of patient past medical history, allergies, medications, health status, including review of consultants reports, laboratory and other test data, was performed as part of comprehensive evaluation and provision of chronic care management services.   SDOH:  (Social Determinants of Health) assessments and interventions performed: No  SDOH Screenings   Alcohol Screen: Not on file  Depression (PHQ2-9): Low Risk    PHQ-2 Score: 0  Financial Resource Strain: Low Risk    Difficulty of Paying Living Expenses: Not very hard  Food Insecurity: No Food Insecurity   Worried About Charity fundraiser in the Last Year: Never true   Ran Out of Food in the Last Year: Never true  Housing: Low Risk    Last Housing Risk Score: 0  Physical Activity: Inactive   Days of Exercise per Week: 0 days   Minutes of Exercise per Session: 0 min  Social Connections: Moderately Integrated   Frequency of Communication with Friends and Family: More than three times a week   Frequency of Social Gatherings with Friends and Family: Three times a week   Attends Religious Services: More than 4 times per year   Active Member of Clubs or Organizations: No   Attends Archivist Meetings: Never   Marital Status: Married  Stress: No Stress Concern Present   Feeling of Stress : Not at all  Tobacco Use: Low Risk    Smoking Tobacco Use: Never   Smokeless Tobacco Use: Never  Transportation Needs: No Transportation Needs   Lack of Transportation (Medical): No   Lack of Transportation (Non-Medical): No     Conditions to be addressed/monitored:  Hypertension, Hyperlipidemia, Diabetes, Coronary Artery Disease, GERD, Osteoarthritis, and Chronic pain  Care Plan : [place]  Updates made by Vladimir Faster, [place] since 05/22/2021 12:00 AM     Problem: HTN, HLD, DM2, Osteoarthritis,  chronic pain, CAD, Angina   Priority: High     Long-Range Goal: disease management   Start Date: 05/22/2021  This Visit's Progress: On track  Priority: High  Note:   Current Barriers:  Unable to independently afford treatment regimen Unable to independently monitor therapeutic efficacy Unable to maintain control of diabetes Does not contact provider office for questions/concerns  Pharmacist Clinical Goal(s):  Patient will verbalize ability to afford treatment regimen achieve adherence to monitoring guidelines and medication adherence to achieve therapeutic efficacy maintain control of diabetes as evidenced by lab values and [place]  contact provider office for questions/concerns as evidenced notation of same in electronic health record through collaboration with PharmD and provider.  Interventions: 1:1 collaboration with Juline Patch, MD regarding development and update of comprehensive plan of care as evidenced by provider attestation and co-signature Inter-disciplinary care team collaboration (see longitudinal plan of care) Comprehensive medication review performed; medication list updated in electronic medical record  Hypertension (BP goal <130/80) -Controlled -Current treatment: Amlodipine 5 mg qd Metoprolol succinate 75 mg qd HCTZ 25 mg qd Lisinopril 10 mg -Medications previously tried: na  -Current home readings: 110s/60s -C-Denies hypotensive/hypertensive symptoms -Educated on Daily salt intake goal < 2300 mg; Exercise goal of 150 minutes per week; Symptoms of hypotension and importance of maintaining adequate hydration; -Counseled to monitor BP at home weekly, document, and provide log at future appointments -Recommended to continue current medication  Last vitamin D No results found for: 25OHVITD2, 25OHVITD3, VD25OH  Hyperlipidemia/ CAD/Angina: (LDL goal < 70) -Controlled -Current treatment: Aspirin 81 mg qd Rosuvastatin 20 mg qd CoQ10 300 mg qd Nitrostat  0.4 mg prn -Medications previously tried: na  --Educated on Benefits of statin for ASCVD risk reduction; Importance of limiting foods high in cholesterol; Exercise goal of 150 minutes per week; -Recommended to continue current medication Recommended vitamin d level to help prevent myalgias  Lab Results  Component Value Date   HGBA1C 7.6 03/17/2021   Lab Results  Component Value Date   CREATININE 0.8 03/17/2021   Lab Results  Component Value Date   LABMICR <3.0 03/02/2017   MICROALBUR 48.4 03/17/2021   MICROALBUR 42.5 05/03/2020  No results found for: VITAMINB12  Diabetes (A1c goal <7%) -Uncontrolled -Current medications: Trulicity 1. 5 mg q week (alternating with 0.75 mg until supply completed) Metformin 1000 mg bid -Medications previously tried: na  -Current home glucose readings fasting glucose: 137 this morning post prandial glucose: 130-140s -Denies hypoglycemic/hyperglycemic symptoms -Current meal patterns:  breakfast: largest meal  lunch: doesn't usually have  dinner: light, salads, lean protein snacks: protein rich mid day  -Current exercise: walks 30 min/day most days, [place] gym pool in the [place] -Educated on A1c and blood sugar goals; Complications of diabetes including kidney damage, retinal damage, and cardiovascular disease; Exercise goal of 150 minutes per week; -Counseled to check feet daily and get yearly eye exams -Counseled on diet and exercise extensively Recommended to continue current medication Collaborated with endocrinology to get patient assistance for Truliciy. Advised patient to call endo office and inquire about status. Recommeng B12 level with next labs.  Chronic pain/spinal stenosis/osteoarthritis (Goal: symptom relief) -Controlled -Current treatment  Oxycontin 10 mg bid Gabapentin 600 mg tid Hydrocodone/apap 10/325 mg prn (40 tabs/month) -Medications previously tried: na  -Counseled on bowel regimen to prevent constipation. Not  currently needed due to SE from metformin and Trulicity  Patient Goals/Self-Care Activities Patient will:  - take medications as prescribed check glucose twice daily, document, and provide at future appointments check blood pressure weekly, document, and provide at future appointments collaborate with provider on medication access solutions target a minimum of 150 minutes of moderate intensity exercise weekly  Follow Up Plan: Telephone follow up appointment with care management team member scheduled for: 6-8 weeks CPA , 3 months PharmD         Medication Assistance: Application for Truliciand [place]   medication assistance program. in process.  Anticipated assistance start date 2-4 weeks form completion by specialists.  See plan of care for additional detail.  Compliance/Adherence/Medication fill history: Care Gaps: None identified   Star-Rating Drugs: Rosuvastatin 20 mg 90 d 5/01/18/21 Lisinopril 10 mg 77O 2/42/353 Trulicity 1.5 mg 84 d 05/15/43  Metformin 1000 mg 90 d3 /20/22  Patient's preferred pharmacy is:  [id number] - [place], [place] [address] Phone: [phone] Fax: [phone]  [facility] - [place], [place] [address] [place] [address] Phone: [phone] Fax: [phone]  [facility] [place], [place] [address] [place] [address] Phone: [phone] Fax: [phone]  Uses pill box? Yes Pt endorses 99% compliance  We discussed: Current pharmacy is preferred with insurance plan and patient is satisfied with pharmacy services Patient decided to: Continue current medication management strategy  Care Plan and Follow Up Patient Decision:  Patient agrees to Care Plan and Follow-up.  Plan: Telephone follow up appointment with care management team member scheduled for:  3 months PharmD, 6-8 weeks CPA  Junita Push.  Kenton Kingfisher PharmD, [facility] [phone]

## 2021-05-22 NOTE — Patient Instructions (Signed)
Visit Information  It was a pleasure speaking with you today. Thank you for letting me be part of your clinical team. Please call with any questions or concerns.    Goals Addressed             This Visit's Progress    Monitor and Manage My Blood Sugar-Diabetes Type 2   On track    Timeframe:  Long-Range Goal Priority:  Medium Start Date:                             Expected End Date:                       Follow Up Date 3 month follow up   - check blood sugar at prescribed times - check blood sugar before and after exercise - check blood sugar if I feel it is too high or too low - enter blood sugar readings and medication or insulin into daily log - take the blood sugar meter to all doctor visits    Why is this important?   Checking your blood sugar at home helps to keep it from getting very high or very low.  Writing the results in a diary or log helps the doctor know how to care for you.  Your blood sugar log should have the time, date and the results.  Also, write down the amount of insulin or other medicine that you take.  Other information, like what you ate, exercise done and how you were feeling, will also be helpful.     Notes:       Track and Manage My Blood Pressure-Hypertension       Timeframe:  Long-Range Goal Priority:  Medium Start Date:                             Expected End Date:                       Follow Up Date 3 month follow up   - check blood pressure weekly - write blood pressure results in a log or diary    Why is this important?   You won't feel high blood pressure, but it can still hurt your blood vessels.  High blood pressure can cause heart or kidney problems. It can also cause a stroke.  Making lifestyle changes like losing a little weight or eating less salt will help.  Checking your blood pressure at home and at different times of the day can help to control blood pressure.  If the doctor prescribes medicine remember to take it the  way the doctor ordered.  Call the office if you cannot afford the medicine or if there are questions about it.     Notes:          The patient verbalized understanding of instructions, educational materials, and care plan provided today and agreed to receive a mailed copy of patient instructions, educational materials, and care plan.   Telephone follow up appointment with pharmacy team member scheduled for: 6- 8 weeks CPA, 3 months PharmD  Junita Push. Kenton Kingfisher PharmD, [place] Clinical Pharmacist [phone]

## 2021-06-11 ENCOUNTER — Telehealth: Payer: Self-pay

## 2021-06-11 NOTE — Telephone Encounter (Signed)
Copied from [place] [phone]. Topic: General - Call Back - No Documentation >> Jun 11, 2021 12:20 PM Mcneil, Ja-Kwan wrote: Reason for CRM: Pt stated she was returning call to [place]. Pt requests call back. Cb# [phone]

## 2021-06-12 ENCOUNTER — Encounter: Payer: Self-pay | Admitting: Family Medicine

## 2021-07-10 DIAGNOSIS — Z01419 Encounter for gynecological examination (general) (routine) without abnormal findings: Secondary | ICD-10-CM | POA: Diagnosis not present

## 2021-07-10 DIAGNOSIS — Z6827 Body mass index (BMI) 27.0-27.9, adult: Secondary | ICD-10-CM | POA: Diagnosis not present

## 2021-07-10 DIAGNOSIS — Z1231 Encounter for screening mammogram for malignant neoplasm of breast: Secondary | ICD-10-CM | POA: Diagnosis not present

## 2021-07-10 DIAGNOSIS — N8189 Other female genital prolapse: Secondary | ICD-10-CM | POA: Diagnosis not present

## 2021-07-10 DIAGNOSIS — N952 Postmenopausal atrophic vaginitis: Secondary | ICD-10-CM | POA: Diagnosis not present

## 2021-08-05 ENCOUNTER — Telehealth: Payer: Self-pay

## 2021-08-05 NOTE — Chronic Care Management (AMB) (Signed)
Chronic Care Management Pharmacy Assistant   Name: Nicole Cook  MRN: [id number] DOB: [birth year]  Reason for Encounter: Disease State Diabetes Mellitus    Recent office visits:  None noted  Recent consult visits:  None noted  Hospital visits:  None in previous 6 months  Medications: Outpatient Encounter Medications as of 08/05/2021  Medication Sig Note   ACCU-CHEK FASTCLIX LANCETS MISC Accu-Chek Fastclix Lancet Drum    amLODipine (NORVASC) 5 MG tablet Take 1 tablet (5 mg total) by mouth daily.    aspirin 81 MG tablet Take 1 tablet by mouth daily.    cetirizine (ZYRTEC) 10 MG tablet Take 10 mg by mouth daily. otc    Cholecalciferol (VITAMIN D3) 1000 units CAPS Take 1 capsule by mouth daily.    Coenzyme Q10 300 MG CAPS Take 1 capsule by mouth daily.    Dulaglutide 1.5 MG/0.5ML SOPN Inject 1 each into the skin once a week. endo .Marland KitchenSunday    estradiol (ESTRING) 2 MG vaginal ring Place 2 mg vaginally every 3 (three) months. follow package directions- GYN    fluticasone (FLONASE) 50 MCG/ACT nasal spray Place 2 sprays into both nostrils at bedtime. One spray each nostril daily    gabapentin (NEURONTIN) 300 MG capsule Take 2 capsules by mouth 3 (three) times daily. [facility]- may take 3 capsules 3 times per day per pt    hydrochlorothiazide (HYDRODIURIL) 25 MG tablet Take 1 tablet (25 mg total) by mouth every morning.    HYDROcodone-acetaminophen (NORCO) 10-325 MG per tablet Take 1 tablet by mouth as needed. [facility] Gets 40 tabs monthly    insulin lispro (HUMALOG) 100 UNIT/ML KwikPen  12/25/2020: Patient uses only as needed when she receives steroid injection    lisinopril (ZESTRIL) 10 MG tablet Take 1 tablet (10 mg total) by mouth daily.    Magnesium 500 MG CAPS Take 1 capsule by mouth daily.    metFORMIN (GLUCOPHAGE) 1000 MG tablet 1 tablet 2 (two) times daily.    metoprolol succinate (TOPROL-XL) 25 MG 24 hr tablet TAKE 1 TABLET DAILY (TAKE WITH 50 MG TO EQUAL 75)    metoprolol  succinate (TOPROL-XL) 50 MG 24 hr tablet Take with or immediately following a meal.    montelukast (SINGULAIR) 10 MG tablet Take 1 tablet (10 mg total) by mouth at bedtime.    Multiple Vitamins-Minerals (CENTRUM SILVER ULTRA WOMENS PO) Take 1 tablet by mouth daily.    nitroGLYCERIN (NITROSTAT) 0.4 MG SL tablet Place 1 tablet under the tongue as needed.    olopatadine (PATANOL) 0.1 % ophthalmic solution Place 1 drop into both eyes 2 (two) times daily.    ondansetron (ZOFRAN) 4 MG tablet Take 1 tablet (4 mg total) by mouth every 8 (eight) hours as needed for nausea or vomiting.    ONE TOUCH ULTRA TEST test strip     OXYCONTIN 10 MG 12 hr tablet Take 10 mg by mouth 2 (two) times daily. Pain Dr    pantoprazole (PROTONIX) 40 MG tablet Take 1 tablet (40 mg total) by mouth daily.    rosuvastatin (CRESTOR) 20 MG tablet TAKE 1 TABLET DAILY    SUMAtriptan (IMITREX) 50 MG tablet Take 1 tablet (50 mg total) by mouth as needed. Per 90 days    tiZANidine (ZANAFLEX) 4 MG tablet tizanidine 4 mg tablet  TAKE 1-2 TABLETS BY MOUTH AT NIGHT AT BEDTIME    vitamin B-12 (CYANOCOBALAMIN) 1000 MCG tablet Take 1,000 mcg by mouth daily.    No  facility-administered encounter medications on file as of 08/05/2021.    Current antihyperglycemic regimen:  Trulicity 1. 5 mg q week (alternating with 0.75 mg until supply completed) Metformin 1000 mg bid  Unsuccessfully reached patient to try to complete assessment call. I have called 3x and asked to return phone call and left voicemail's as well.  Adherence Review: Is the patient currently on a STATIN medication? Yes Is the patient currently on ACE/ARB medication? No Does the patient have >5 day gap between last estimated fill dates? No   Star Rating Drugs: Lisinopril 10 mg Last filled:07/05/21 90 DS Rosuvastatin 20 mg Last filled:05/07/21 90 DS  Myriam Elta [place], [place]

## 2021-08-26 ENCOUNTER — Ambulatory Visit (INDEPENDENT_AMBULATORY_CARE_PROVIDER_SITE_OTHER): Payer: Medicare Other

## 2021-08-26 DIAGNOSIS — E1169 Type 2 diabetes mellitus with other specified complication: Secondary | ICD-10-CM

## 2021-08-26 DIAGNOSIS — E1165 Type 2 diabetes mellitus with hyperglycemia: Secondary | ICD-10-CM

## 2021-08-26 NOTE — Progress Notes (Signed)
Chronic Care Management Pharmacy Note   Summary -PAP for Trulicity 1.5 mg in progress - refaxed to dr Gershon Crane  -care gaps - COVID-19 Vaccine (4-booster), shingrix, influenza vaccine (already has 11/15 appt)  08/26/2021  Name:  Nicole Cook MRN:  [id number] DOB:  02-May-1951  Subjective: Nicole Cook is an 70 y.o. year old female who is a primary patient of Duanne Limerick, MD.  The CCM team was consulted for assistance with disease management and care coordination needs.    Engaged with patient by telephone for follow up visit in response to provider referral for pharmacy case management and/or care coordination services.   Consent to Services:  The patient was given information about Chronic Care Management services, agreed to services, and gave verbal consent prior to initiation of services.  Please see initial visit note for detailed documentation.   Patient Care Team: Duanne Limerick, MD as PCP - General (Family Medicine) Laretta Bolster, MD as Consulting Physician (Cardiology) Venita Sheffield, MD as Consulting Physician (Occupational Medicine) Doyle Askew, MD as Consulting Physician (Obstetrics and Gynecology) Lajean Manes, [facility] (Pharmacist) Sherlon Handing, MD as Consulting Physician (Internal Medicine)  Objective:  Lab Results  Component Value Date   CREATININE 0.8 03/17/2021   BUN 17 03/17/2021   GFRNONAA 74 11/21/2020   GFRAA 86 11/21/2020   NA 139 03/17/2021   K 4.9 03/17/2021   CALCIUM 9.6 11/21/2020   CO2 26 11/21/2020   GLUCOSE 150 (H) 11/21/2020    Lab Results  Component Value Date/Time   HGBA1C 7.6 03/17/2021 12:00 AM   HGBA1C 7.2 11/15/2020 12:00 AM   MICROALBUR 48.4 03/17/2021 12:00 AM   MICROALBUR 42.5 05/03/2020 12:00 AM    Last diabetic Eye exam:  Lab Results  Component Value Date/Time   HMDIABEYEEXA No Retinopathy 01/28/2021 12:00 AM    Last diabetic Foot exam:  Lab Results  Component Value Date/Time   HMDIABFOOTEX  normal 05/03/2020 12:00 AM     Lab Results  Component Value Date   CHOL 125 03/17/2021   HDL 38 03/17/2021   LDLCALC 46 03/17/2021   TRIG 200 (A) 03/17/2021   CHOLHDL 3.3 03/09/2018    Hepatic Function Latest Ref Rng & Units 03/17/2021 11/21/2020 05/21/2020  Total Protein 6.0 - 8.5 g/dL - - 6.9  Albumin 3.7 - 4.7 g/dL - 4.6 4.7  AST 13 - 35 18 - 18  ALT 7 - 35 21 - 17  Alk Phosphatase 25 - 125 39 - 45(L)  Total Bilirubin 0.0 - 1.2 mg/dL - - 0.3  Bilirubin, Direct 0.00 - 0.40 mg/dL - - 6.18    No results found for: TSH, FREET4  CBC Latest Ref Rng & Units 10/10/2018  WBC 3.4 - 10.8 x10E3/uL 5.0  Hemoglobin 11.1 - 15.9 g/dL 42.7  Hematocrit 64.8 - 46.6 % 37.2  Platelets 150 - 450 x10E3/uL 185    No results found for: VD25OH  Clinical ASCVD: Yes  The ASCVD Risk score (Arnett DK, et al., 2019) failed to calculate for the following reasons:   The valid total cholesterol range is 130 to 320 mg/dL    Depression screen [facility] 2/9 05/07/2021 12/25/2020 11/21/2020  Decreased Interest 0 0 0  Down, Depressed, Hopeless 0 0 0  PHQ - 2 Score 0 0 0  Altered sleeping 0 - 0  Tired, decreased energy 0 - 0  Change in appetite 0 - 0  Feeling bad or failure about yourself  0 - 0  Trouble concentrating 0 - 0  Moving slowly or fidgety/restless 0 - 0  Suicidal thoughts 0 - 0  PHQ-9 Score 0 - 0  Some recent data might be hidden      Social History   Tobacco Use  Smoking Status Never  Smokeless Tobacco Never   BP Readings from Last 3 Encounters:  05/07/21 110/80  11/21/20 130/70  05/21/20 120/60   Pulse Readings from Last 3 Encounters:  05/07/21 68  11/21/20 76  05/21/20 68   Wt Readings from Last 3 Encounters:  05/07/21 167 lb (75.8 kg)  11/21/20 170 lb (77.1 kg)  05/21/20 166 lb (75.3 kg)   BMI Readings from Last 3 Encounters:  05/07/21 27.79 kg/m  11/21/20 28.29 kg/m  05/21/20 27.62 kg/m    Assessment/Interventions: Review of patient past medical history, allergies,  medications, health status, including review of consultants reports, laboratory and other test data, was performed as part of comprehensive evaluation and provision of chronic care management services.   SDOH:  (Social Determinants of Health) assessments and interventions performed:  SDOH Screenings   Alcohol Screen: Not on file  Depression (PHQ2-9): Low Risk    PHQ-2 Score: 0  Financial Resource Strain: Low Risk    Difficulty of Paying Living Expenses: Not very hard  Food Insecurity: No Food Insecurity   Worried About Charity fundraiser in the Last Year: Never true   Ran Out of Food in the Last Year: Never true  Housing: Low Risk    Last Housing Risk Score: 0  Physical Activity: Inactive   Days of Exercise per Week: 0 days   Minutes of Exercise per Session: 0 min  Social Connections: Moderately Integrated   Frequency of Communication with Friends and Family: More than three times a week   Frequency of Social Gatherings with Friends and Family: Three times a week   Attends Religious Services: More than 4 times per year   Active Member of Clubs or Organizations: No   Attends Archivist Meetings: Never   Marital Status: Married  Stress: No Stress Concern Present   Feeling of Stress : Not at all  Tobacco Use: Low Risk    Smoking Tobacco Use: Never   Smokeless Tobacco Use: Never  Transportation Needs: No Transportation Needs   Lack of Transportation (Medical): No   Lack of Transportation (Non-Medical): No   Conditions to be addressed/monitored:  Hypertension, Hyperlipidemia, Diabetes, Coronary Artery Disease, GERD, Osteoarthritis, and Chronic pain   Current Barriers:  Unable to independently afford treatment regimen Unable to independently monitor therapeutic efficacy Unable to maintain control of diabetes Does not contact provider office for questions/concerns  Pharmacist Clinical Goal(s):  Patient will verbalize ability to afford treatment regimen achieve  adherence to monitoring guidelines and medication adherence to achieve therapeutic efficacy maintain control of diabetes as evidenced by lab values and Badings  contact provider office for questions/concerns as evidenced notation of same in electronic health record through collaboration with PharmD and provider.   Interventions: 1:1 collaboration with Juline Patch, MD regarding development and update of comprehensive plan of care as evidenced by provider attestation and co-signature Inter-disciplinary care team collaboration (see longitudinal plan of care) Comprehensive medication review performed; medication list updated in electronic medical record  Hypertension (BP goal <130/80) -Controlled -Current treatment: Amlodipine 5 mg qd Metoprolol succinate 75 mg qd HCTZ 25 mg qd Lisinopril 10 mg -Medications previously tried: na  -Current home readings: <130//80 -Denies hypotensive/hypertensive symptoms -Educated on Daily salt intake goal <  2300 mg; Exercise goal of 150 minutes per week; -Recommended to continue current medication  Hyperlipidemia (LDL goal < 70) -Controlled -Current treatment: Rosuvastatin 20 mg qd -Medications previously tried: na  --Educated on Benefits of statin for ASCVD risk reduction; Importance of limiting foods high in cholesterol; Exercise goal of 150 minutes per week; -Recommended to continue current medication Recommended vitamin d level to help prevent myalgias  Diabetes (A1c goal <7%) -Uncontrolled -Started off not tolerating - now reports to have gotten used to stomach effects including gas, n/v -Current medications: Trulicity 1. 5 mg q week (alternating with 0.75 mg until supply completed) Metformin 1000 mg bid 9am and 9pm -Current home glucose readings fasting glucose: 160s-170s post prandial glucose: 140s -Denies hypoglycemic/hyperglycemic symptoms -Current meal patterns: trying to keep sweets down - cookies, cakes -Current exercise: walks  30 min/day most days, uses gym pool in the winter. Bad knees and back -Educated on A1c and blood sugar goals; Complications of diabetes including kidney damage, retinal damage, and cardiovascular disease; Exercise goal of 150 minutes per week; -Counseled on diet and exercise extensively Recommended to continue current medication Had not heard back from Trulicity PAP after turning in application - will resend for signing  Patient Goals/Self-Care Activities Patient will:  - take medications as prescribed check glucose twice daily, document, and provide at future appointments check blood pressure weekly, document, and provide at future appointments collaborate with provider on medication access solutions target a minimum of 150 minutes of moderate intensity exercise weekly  Medication Assistance: Will resend prescriber portion of trulicity patient assistance to endocrinology  Patient's preferred pharmacy is:  [place], [place] [address] [place] [address] Phone: [phone] Fax: [phone] Follow Up Plan: Telephone follow up appointment with care management team member scheduled for: 6-8 weeks CPA , 3 months PharmD  Care Plan and Follow Up Patient Decision:  Patient agrees to Care Plan and Follow-up.  Plan: Telephone follow up appointment with care management team member scheduled for:  3 months PharmD, 6-8 weeks CPA Future Appointments  Date Time Provider Rudy  10/28/2021  9:20 AM Juline Patch, MD [facility]  12/30/2021  1:00 PM [facility]  12/31/2021 10:40 AM [facility] NURSE HEALTH ADVISOR [facility] [place], PharmD, CPP Clinical Pharmacist Practitioner  [phone]

## 2021-08-26 NOTE — Patient Instructions (Addendum)
Ms. Zylstra,  Thank you for talking with me today. I have included our care plan/goals in the following pages.   Please review and call me at [phone] with any questions.  Thanks! Ellin Mayhew, PharmD, CPP Clinical Pharmacist Practitioner  [phone]   Current Barriers:  Unable to independently afford treatment regimen Unable to independently monitor therapeutic efficacy Unable to maintain control of diabetes Does not contact provider office for questions/concerns  Pharmacist Clinical Goal(s):  Patient will verbalize ability to afford treatment regimen achieve adherence to monitoring guidelines and medication adherence to achieve therapeutic efficacy maintain control of diabetes as evidenced by lab values and Badings  contact provider office for questions/concerns as evidenced notation of same in electronic health record through collaboration with PharmD and provider.   Interventions: 1:1 collaboration with Juline Patch, MD regarding development and update of comprehensive plan of care as evidenced by provider attestation and co-signature Inter-disciplinary care team collaboration (see longitudinal plan of care) Comprehensive medication review performed; medication list updated in electronic medical record  Hypertension (BP goal <130/80) -Controlled -Current treatment: Amlodipine 5 mg qd Metoprolol succinate 75 mg qd HCTZ 25 mg qd Lisinopril 10 mg -Medications previously tried: na  -Current home readings: <130//80 -Denies hypotensive/hypertensive symptoms -Educated on Daily salt intake goal < 2300 mg; Exercise goal of 150 minutes per week; -Recommended to continue current medication  Hyperlipidemia (LDL goal < 70) -Controlled -Current treatment: Rosuvastatin 20 mg qd -Medications previously tried: na  --Educated on Benefits of statin for ASCVD risk reduction; Importance of limiting foods high in cholesterol; Exercise goal of 150 minutes per  week; -Recommended to continue current medication Recommended vitamin d level to help prevent myalgias  Diabetes (A1c goal <7%) -Uncontrolled -Started off not tolerating - now reports to have gotten used to stomach effects including gas, n/v -Current medications: Trulicity 1. 5 mg q week (alternating with 0.75 mg until supply completed) Metformin 1000 mg bid 9am and 9pm -Current home glucose readings fasting glucose: 160s-170s post prandial glucose: 140s -Denies hypoglycemic/hyperglycemic symptoms -Current meal patterns: trying to keep sweets down - cookies, cakes -Current exercise: walks 30 min/day most days, uses gym pool in the winter. Bad knees and back -Educated on A1c and blood sugar goals; Complications of diabetes including kidney damage, retinal damage, and cardiovascular disease; Exercise goal of 150 minutes per week; -Counseled on diet and exercise extensively Recommended to continue current medication Had not heard back from Trulicity PAP after turning in application - will resend for signing  Patient Goals/Self-Care Activities Patient will:  - take medications as prescribed check glucose twice daily, document, and provide at future appointments check blood pressure weekly, document, and provide at future appointments collaborate with provider on medication access solutions target a minimum of 150 minutes of moderate intensity exercise weekly  Medication Assistance: Will resend prescriber portion of trulicity patient assistance to endocrinology  Star-[facility] Drugs: Rosuvastatin 20 mg 90 d 5/01/18/21 Lisinopril 10 mg 0000000 [id number] Trulicity 1.5 mg 84 d [id number] Metformin 1000 mg 90 d3 /20/22  Patient's preferred pharmacy is:  [place], [place] [address] [place] [address] Phone: [phone] Fax: [phone] Follow Up Plan: Telephone follow up appointment with care management team member scheduled  for: 6-8 weeks CPA , 3 months PharmD  Care Plan and Follow Up Patient Decision:  Patient agrees to Care Plan and Follow-up.  Telephone follow up appointment with care management team member  scheduled for:  3 months PharmD, 6-8 weeks CPA  The patient verbalized understanding of instructions provided today and agreed to receive a MyChart copy of patient instruction and/or educational materials. Telephone follow up appointment with pharmacy team member scheduled for: See next appointment with "Care Management Staff" under "What's Next" below.

## 2021-09-12 DIAGNOSIS — Z794 Long term (current) use of insulin: Secondary | ICD-10-CM | POA: Diagnosis not present

## 2021-09-12 DIAGNOSIS — E1165 Type 2 diabetes mellitus with hyperglycemia: Secondary | ICD-10-CM | POA: Diagnosis not present

## 2021-09-12 DIAGNOSIS — E785 Hyperlipidemia, unspecified: Secondary | ICD-10-CM | POA: Diagnosis not present

## 2021-09-12 DIAGNOSIS — E1169 Type 2 diabetes mellitus with other specified complication: Secondary | ICD-10-CM

## 2021-09-19 DIAGNOSIS — E1169 Type 2 diabetes mellitus with other specified complication: Secondary | ICD-10-CM | POA: Diagnosis not present

## 2021-09-19 DIAGNOSIS — E785 Hyperlipidemia, unspecified: Secondary | ICD-10-CM | POA: Diagnosis not present

## 2021-09-19 DIAGNOSIS — E1142 Type 2 diabetes mellitus with diabetic polyneuropathy: Secondary | ICD-10-CM | POA: Diagnosis not present

## 2021-09-19 DIAGNOSIS — E1159 Type 2 diabetes mellitus with other circulatory complications: Secondary | ICD-10-CM | POA: Diagnosis not present

## 2021-09-19 DIAGNOSIS — I152 Hypertension secondary to endocrine disorders: Secondary | ICD-10-CM | POA: Diagnosis not present

## 2021-09-19 LAB — HEMOGLOBIN A1C: Hemoglobin A1C: 6.2

## 2021-10-28 ENCOUNTER — Other Ambulatory Visit: Payer: Self-pay

## 2021-10-28 ENCOUNTER — Ambulatory Visit (INDEPENDENT_AMBULATORY_CARE_PROVIDER_SITE_OTHER): Payer: Medicare Other | Admitting: Family Medicine

## 2021-10-28 ENCOUNTER — Encounter: Payer: Self-pay | Admitting: Family Medicine

## 2021-10-28 VITALS — BP 122/78 | HR 69 | Ht 65.0 in | Wt 168.2 lb

## 2021-10-28 DIAGNOSIS — K219 Gastro-esophageal reflux disease without esophagitis: Secondary | ICD-10-CM

## 2021-10-28 DIAGNOSIS — G43109 Migraine with aura, not intractable, without status migrainosus: Secondary | ICD-10-CM | POA: Diagnosis not present

## 2021-10-28 DIAGNOSIS — E785 Hyperlipidemia, unspecified: Secondary | ICD-10-CM

## 2021-10-28 DIAGNOSIS — R110 Nausea: Secondary | ICD-10-CM

## 2021-10-28 DIAGNOSIS — I10 Essential (primary) hypertension: Secondary | ICD-10-CM | POA: Diagnosis not present

## 2021-10-28 DIAGNOSIS — J301 Allergic rhinitis due to pollen: Secondary | ICD-10-CM | POA: Diagnosis not present

## 2021-10-28 MED ORDER — HYDROCHLOROTHIAZIDE 25 MG PO TABS: 25.0000 mg | ORAL_TABLET | Freq: Every morning | ORAL | 1 refills | Status: DC

## 2021-10-28 MED ORDER — PANTOPRAZOLE SODIUM 40 MG PO TBEC: 40.0000 mg | DELAYED_RELEASE_TABLET | Freq: Every day | ORAL | 1 refills | Status: DC

## 2021-10-28 MED ORDER — METOPROLOL SUCCINATE ER 50 MG PO TB24: ORAL_TABLET | ORAL | 1 refills | Status: DC

## 2021-10-28 MED ORDER — ONDANSETRON HCL 4 MG PO TABS: 4.0000 mg | ORAL_TABLET | Freq: Three times a day (TID) | ORAL | 2 refills | Status: DC | PRN

## 2021-10-28 MED ORDER — MONTELUKAST SODIUM 10 MG PO TABS: 10.0000 mg | ORAL_TABLET | Freq: Every day | ORAL | 1 refills | Status: DC

## 2021-10-28 MED ORDER — METOPROLOL SUCCINATE ER 25 MG PO TB24: ORAL_TABLET | ORAL | 1 refills | Status: DC

## 2021-10-28 MED ORDER — SUMATRIPTAN SUCCINATE 50 MG PO TABS
50.0000 mg | ORAL_TABLET | ORAL | 1 refills | Status: DC | PRN
Start: 2021-10-28 — End: 2022-04-27

## 2021-10-28 MED ORDER — ROSUVASTATIN CALCIUM 20 MG PO TABS: ORAL_TABLET | ORAL | 1 refills | Status: DC

## 2021-10-28 MED ORDER — LISINOPRIL 10 MG PO TABS: 10.0000 mg | ORAL_TABLET | Freq: Every day | ORAL | 1 refills | Status: DC

## 2021-10-28 MED ORDER — AMLODIPINE BESYLATE 5 MG PO TABS: 5.0000 mg | ORAL_TABLET | Freq: Every day | ORAL | 1 refills | Status: DC

## 2021-10-28 MED ORDER — FLUTICASONE PROPIONATE 50 MCG/ACT NA SUSP: 2.0000 | Freq: Every evening | NASAL | 1 refills | Status: DC

## 2021-10-28 NOTE — Progress Notes (Signed)
Date:  10/28/2021   Name:  Nicole Cook   DOB:  1951/05/01   MRN:  [id number]   Chief Complaint: Hypertension and Gastroesophageal Reflux (Migraine/)  Hypertension This is a chronic problem. The current episode started more than 1 year ago. The problem has been gradually improving since onset. The problem is controlled. Pertinent negatives include no anxiety, blurred vision, chest pain, headaches, malaise/fatigue, neck pain, orthopnea, palpitations, peripheral edema, PND, shortness of breath or sweats. Risk factors for coronary artery disease include dyslipidemia. Past treatments include ACE inhibitors, calcium channel blockers, diuretics and beta blockers. The current treatment provides mild improvement. There are no compliance problems.  There is no history of angina, kidney disease, CAD/MI, CVA, heart failure, left ventricular hypertrophy, PVD or retinopathy. There is no history of chronic renal disease, a hypertension causing med or renovascular disease.  Hyperlipidemia This is a chronic problem. The current episode started more than 1 year ago. The problem is controlled. Recent lipid tests were reviewed and are normal. She has no history of chronic renal disease, diabetes or hypothyroidism. Pertinent negatives include no chest pain, focal sensory loss, focal weakness, myalgias or shortness of breath. Current antihyperlipidemic treatment includes statins. The current treatment provides moderate improvement of lipids. Risk factors for coronary artery disease include diabetes mellitus, dyslipidemia and hypertension.  Gastroesophageal Reflux She reports no abdominal pain, no belching, no chest pain, no coughing, no dysphagia or no heartburn. for migraines. This is a chronic problem. The current episode started more than 1 year ago. The problem has been gradually improving.  Neurologic Problem The patient's pertinent negatives include no focal sensory loss, focal weakness, loss of balance,  slurred speech or visual change. Primary symptoms comment: for migraines. The problem has been gradually improving since onset. Pertinent negatives include no abdominal pain, back pain, chest pain, dizziness, fever, headaches, neck pain, palpitations or shortness of breath.   Lab Results  Component Value Date   CREATININE 0.8 03/17/2021   BUN 17 03/17/2021   NA 139 03/17/2021   K 4.9 03/17/2021   CL 98 11/21/2020   CO2 26 11/21/2020   Lab Results  Component Value Date   CHOL 125 03/17/2021   HDL 38 03/17/2021   LDLCALC 46 03/17/2021   TRIG 200 (A) 03/17/2021   CHOLHDL 3.3 03/09/2018   No results found for: TSH Lab Results  Component Value Date   HGBA1C 7.6 03/17/2021   Lab Results  Component Value Date   WBC 5.0 10/10/2018   HGB 12.4 10/10/2018   HCT 37.2 10/10/2018   MCV 86 10/10/2018   PLT 185 10/10/2018   Lab Results  Component Value Date   ALT 21 03/17/2021   AST 18 03/17/2021   ALKPHOS 39 03/17/2021   BILITOT 0.3 05/21/2020     Review of Systems  Constitutional:  Negative for chills, fever and malaise/fatigue.  HENT:  Negative for drooling, ear discharge and ear pain.   Eyes:  Negative for blurred vision.  Respiratory:  Negative for cough and shortness of breath.   Cardiovascular:  Negative for chest pain, palpitations, orthopnea, leg swelling and PND.  Gastrointestinal:  Negative for abdominal pain, blood in stool, constipation, diarrhea, dysphagia and heartburn.  Endocrine: Negative for polydipsia.  Genitourinary:  Negative for dysuria, frequency, hematuria and urgency.  Musculoskeletal:  Negative for back pain, myalgias and neck pain.  Skin:  Negative for rash.  Allergic/Immunologic: Negative for environmental allergies.  Neurological:  Negative for dizziness, focal weakness, headaches and loss of  balance.  Hematological:  Does not bruise/bleed easily.  Psychiatric/Behavioral:  Negative for suicidal ideas. The patient is not nervous/anxious.    Patient  Active Problem List   Diagnosis Date Noted   Hyperlipidemia associated with type 2 diabetes mellitus ([place]) 12/25/2019   Atrophic vaginitis 11/23/2018   Pelvic floor relaxation 11/23/2018   Primary osteoarthritis of right knee 05/12/2018   Sacral back pain 08/04/2017   Taking medication for chronic disease 03/02/2017   Seasonal allergic rhinitis due to pollen 03/02/2017   Gastroesophageal reflux disease 03/02/2017   Migraine with aura and without status migrainosus, not intractable 03/02/2017   Angina pectoris ([place]) 11/09/2016   Chronic right-sided low back pain without sciatica 10/29/2016   Trochanteric bursitis of right hip 07/31/2015   Primary osteoarthritis of right hip 07/31/2015   Type 2 diabetes mellitus with hyperglycemia, with long-term current use of insulin ([place]) 04/03/2015   Hyperlipidemia 04/03/2015   Laboratory animal allergy 04/03/2015   Cephalalgia 04/03/2015   Essential hypertension 04/03/2015   Blood glucose elevated 04/03/2015   Routine general medical examination at a health care facility 04/03/2015   CAD (coronary artery disease) 04/03/2015   Dermatochalasis of both upper eyelids 09/20/2014   Sleep apnea 09/03/2014   DDD (degenerative disc disease), lumbosacral 03/03/2012   Spinal stenosis of lumbar region without neurogenic claudication 03/03/2012   Thoracic or lumbosacral neuritis or radiculitis, unspecified 03/03/2012    Allergies  Allergen Reactions   Aspartame Other (See Comments)   Compazine [Prochlorperazine]    Penicillins    Sulfa Antibiotics    Sulfamethoxazole-Trimethoprim Hives and Other (See Comments)    Other Reaction: Not Assessed     Past Surgical History:  Procedure Laterality Date   back surgery x 2     c-section x [place]  2011   cleared for 5 yrs- Dr Beckey Downing   EYE SURGERY     eyelid lesion   KNEE ARTHROSCOPY Right 2010   SPINE SURGERY  2003,2006   TUBAL LIGATION  1982    VAGINAL HYSTERECTOMY  12/14/1994    Social History   Tobacco Use   Smoking status: Never   Smokeless tobacco: Never  Vaping Use   Vaping Use: Never used  Substance Use Topics   Alcohol use: No    Alcohol/week: 0.0 standard drinks   Drug use: No     Medication list has been reviewed and updated.  Current Meds  Medication Sig   ACCU-CHEK FASTCLIX LANCETS MISC Accu-Chek Fastclix Lancet Drum   amLODipine (NORVASC) 5 MG tablet Take 1 tablet (5 mg total) by mouth daily.   aspirin 81 MG tablet Take 1 tablet by mouth daily.   cetirizine (ZYRTEC) 10 MG tablet Take 10 mg by mouth daily. otc   Cholecalciferol (VITAMIN D3) 1000 units CAPS Take 1 capsule by mouth daily.   Coenzyme Q10 300 MG CAPS Take 1 capsule by mouth daily.   Dulaglutide 1.5 MG/0.5ML SOPN Inject 1 each into the skin once a week. endo .Marland KitchenSunday   estradiol (ESTRING) 2 MG vaginal ring Place 2 mg vaginally every 3 (three) months. follow package directions- GYN   fluticasone (FLONASE) 50 MCG/ACT nasal spray Place 2 sprays into both nostrils at bedtime. One spray each nostril daily   gabapentin (NEURONTIN) 300 MG capsule Take 2 capsules by mouth 3 (three) times daily. [facility]- may take 3 capsules 3 times per day per pt   hydrochlorothiazide (HYDRODIURIL) 25 MG tablet  Take 1 tablet (25 mg total) by mouth every morning.   HYDROcodone-acetaminophen (NORCO) 10-325 MG per tablet Take 1 tablet by mouth as needed. [facility] Gets 40 tabs monthly   insulin lispro (HUMALOG) 100 UNIT/ML KwikPen    lisinopril (ZESTRIL) 10 MG tablet Take 1 tablet (10 mg total) by mouth daily.   Magnesium 500 MG CAPS Take 1 capsule by mouth daily.   metFORMIN (GLUCOPHAGE) 1000 MG tablet 1 tablet 2 (two) times daily.   metoprolol succinate (TOPROL-XL) 25 MG 24 hr tablet TAKE 1 TABLET DAILY (TAKE WITH 50 MG TO EQUAL 75)   metoprolol succinate (TOPROL-XL) 50 MG 24 hr tablet Take with or immediately following a meal.   montelukast (SINGULAIR) 10 MG tablet  Take 1 tablet (10 mg total) by mouth at bedtime.   Multiple Vitamins-Minerals (CENTRUM SILVER ULTRA WOMENS PO) Take 1 tablet by mouth daily.   nitroGLYCERIN (NITROSTAT) 0.4 MG SL tablet Place 1 tablet under the tongue as needed.   olopatadine (PATANOL) 0.1 % ophthalmic solution Place 1 drop into both eyes 2 (two) times daily.   ondansetron (ZOFRAN) 4 MG tablet Take 1 tablet (4 mg total) by mouth every 8 (eight) hours as needed for nausea or vomiting.   ONE TOUCH ULTRA TEST test strip    OXYCONTIN 10 MG 12 hr tablet Take 10 mg by mouth 2 (two) times daily. Pain Dr   pantoprazole (PROTONIX) 40 MG tablet Take 1 tablet (40 mg total) by mouth daily.   rosuvastatin (CRESTOR) 20 MG tablet TAKE 1 TABLET DAILY   SUMAtriptan (IMITREX) 50 MG tablet Take 1 tablet (50 mg total) by mouth as needed. Per 90 days   tiZANidine (ZANAFLEX) 4 MG tablet tizanidine 4 mg tablet  TAKE 1-2 TABLETS BY MOUTH AT NIGHT AT BEDTIME   vitamin B-12 (CYANOCOBALAMIN) 1000 MCG tablet Take 1,000 mcg by mouth daily.    PHQ 2/9 Scores 05/07/2021 12/25/2020 11/21/2020 05/21/2020  PHQ - 2 Score 0 0 0 0  PHQ- 9 Score 0 - 0 0    GAD 7 : Generalized Anxiety Score 05/07/2021 11/21/2020 05/21/2020  Nervous, Anxious, on Edge 0 0 0  Control/stop worrying 0 0 0  Worry too much - different things 0 0 0  Trouble relaxing 0 0 0  Restless 0 0 0  Easily annoyed or irritable 0 0 0  Afraid - awful might happen 0 0 0  Total GAD 7 Score 0 0 0    BP Readings from Last 3 Encounters:  05/07/21 110/80  11/21/20 130/70  05/21/20 120/60    Physical Exam Vitals and nursing note reviewed.  Constitutional:      Appearance: She is well-developed.  HENT:     Head: Normocephalic.     Right Ear: Tympanic membrane, ear canal and external ear normal.     Left Ear: Tympanic membrane, ear canal and external ear normal.     Nose: Nose normal. No congestion or rhinorrhea.  Eyes:     General: Lids are everted, no foreign bodies appreciated. No scleral  icterus.       Left eye: No foreign body or hordeolum.     Conjunctiva/sclera: Conjunctivae normal.     Right eye: Right conjunctiva is not injected.     Left eye: Left conjunctiva is not injected.     Pupils: Pupils are equal, round, and reactive to light.  Neck:     Thyroid: No thyromegaly.     Vascular: No carotid bruit or JVD.  Trachea: No tracheal deviation.  Cardiovascular:     Rate and Rhythm: Normal rate and regular rhythm.     Heart sounds: Normal heart sounds. No murmur heard.   No friction rub. No gallop.  Pulmonary:     Effort: Pulmonary effort is normal. No respiratory distress.     Breath sounds: Normal breath sounds. No wheezing, rhonchi or rales.  Abdominal:     General: Bowel sounds are normal.     Palpations: Abdomen is soft. There is no mass.     Tenderness: There is no abdominal tenderness. There is no guarding or rebound.  Musculoskeletal:        General: No tenderness. Normal range of motion.     Cervical back: Normal range of motion and neck supple.  Lymphadenopathy:     Cervical: No cervical adenopathy.  Skin:    General: Skin is warm.     Findings: No rash.  Neurological:     Mental Status: She is alert and oriented to person, place, and time.     Cranial Nerves: No cranial nerve deficit.     Deep Tendon Reflexes: Reflexes normal.  Psychiatric:        Mood and Affect: Mood is not anxious or depressed.    Wt Readings from Last 3 Encounters:  05/07/21 167 lb (75.8 kg)  11/21/20 170 lb (77.1 kg)  05/21/20 166 lb (75.3 kg)    Ht 5\' 5"  (1.651 m)   BMI 27.79 kg/m   Assessment and Plan:  1. Essential hypertension Chronic.  Controlled.  Stable.  Blood pressure is 122/78.  Patient will continue Toprol-XL 25 and Toprol-XL 50 once a day as well as amlodipine 5 mg once a day hydrochlorothiazide 25 mg once a day and lisinopril 10 mg once a day.  Will check CMP for electrolytes and GFR. - amLODipine (NORVASC) 5 MG tablet; Take 1 tablet (5 mg total) by  mouth daily.  Dispense: 90 tablet; Refill: 1 - hydrochlorothiazide (HYDRODIURIL) 25 MG tablet; Take 1 tablet (25 mg total) by mouth every morning.  Dispense: 90 tablet; Refill: 1 - lisinopril (ZESTRIL) 10 MG tablet; Take 1 tablet (10 mg total) by mouth daily.  Dispense: 90 tablet; Refill: 1 - metoprolol succinate (TOPROL-XL) 25 MG 24 hr tablet; TAKE 1 TABLET DAILY (TAKE WITH 50 MG TO EQUAL 75)  Dispense: 90 tablet; Refill: 1 - metoprolol succinate (TOPROL-XL) 50 MG 24 hr tablet; Take with or immediately following a meal.  Dispense: 90 tablet; Refill: 1 - Comprehensive metabolic panel  2. Gastroesophageal reflux disease, unspecified whether esophagitis present Chronic.  Controlled.  Stable.  Controlled on pantoprazole 40 mg once a day. - pantoprazole (PROTONIX) 40 MG tablet; Take 1 tablet (40 mg total) by mouth daily.  Dispense: 90 tablet; Refill: 1  3. Hyperlipidemia, unspecified hyperlipidemia type Chronic.  Controlled.  Stable.  Continue with rosuvastatin 20 mg once a day.  Will check lipid panel for current level of control and note AST/ALT on CMP. - rosuvastatin (CRESTOR) 20 MG tablet; TAKE 1 TABLET DAILY  Dispense: 90 tablet; Refill: 1 - Lipid Panel With LDL/HDL Ratio  4. Migraine with aura and without status migrainosus, not intractable Chronic.  Controlled.  Stable.  Controlled with combination of Imitrex 50 mg as needed and Zofran 4 mg as needed for nausea associated with migraines. - SUMAtriptan (IMITREX) 50 MG tablet; Take 1 tablet (50 mg total) by mouth as needed. Per 90 days  Dispense: 10 tablet; Refill: 1 - ondansetron (ZOFRAN) 4 MG  tablet; Take 1 tablet (4 mg total) by mouth every 8 (eight) hours as needed for nausea or vomiting.  Dispense: 20 tablet; Refill: 2  5. Nausea Chronic.  Controlled.  Stable.  Patient also has occasional need for nodule which is controlled with the Zofran 4 mg every 8 hours for nausea. - ondansetron (ZOFRAN) 4 MG tablet; Take 1 tablet (4 mg total) by  mouth every 8 (eight) hours as needed for nausea or vomiting.  Dispense: 20 tablet; Refill: 2  6. Seasonal allergic rhinitis due to pollen Chronic.  Controlled.  Seasonal.  Uncomplicated.  Controlled on combination Flonase nasal steroid 2 sprays both nostril at bedtime and Singulair 10 mg once a day. - fluticasone (FLONASE) 50 MCG/ACT nasal spray; Place 2 sprays into both nostrils at bedtime. One spray each nostril daily  Dispense: 16 g; Refill: 1 - montelukast (SINGULAIR) 10 MG tablet; Take 1 tablet (10 mg total) by mouth at bedtime.  Dispense: 90 tablet; Refill: 1 chronic.  Controlled.  Stable.

## 2021-10-29 ENCOUNTER — Other Ambulatory Visit: Payer: Self-pay

## 2021-10-29 DIAGNOSIS — J301 Allergic rhinitis due to pollen: Secondary | ICD-10-CM

## 2021-10-29 LAB — COMPREHENSIVE METABOLIC PANEL
ALT: 26 IU/L (ref 0–32)
AST: 22 IU/L (ref 0–40)
Albumin/Globulin Ratio: 2 (ref 1.2–2.2)
Albumin: 4.7 g/dL (ref 3.7–4.7)
Alkaline Phosphatase: 46 IU/L (ref 44–121)
BUN/Creatinine Ratio: 18 (ref 12–28)
BUN: 15 mg/dL (ref 8–27)
Bilirubin Total: 0.3 mg/dL (ref 0.0–1.2)
CO2: 25 mmol/L (ref 20–29)
Calcium: 9.5 mg/dL (ref 8.7–10.3)
Chloride: 98 mmol/L (ref 96–106)
Creatinine, Ser: 0.82 mg/dL (ref 0.57–1.00)
Globulin, Total: 2.3 g/dL (ref 1.5–4.5)
Glucose: 151 mg/dL — ABNORMAL HIGH (ref 70–99)
Potassium: 4.6 mmol/L (ref 3.5–5.2)
Sodium: 140 mmol/L (ref 134–144)
Total Protein: 7 g/dL (ref 6.0–8.5)
eGFR: 76 mL/min/{1.73_m2} (ref >59)

## 2021-10-29 LAB — LIPID PANEL WITH LDL/HDL RATIO
Cholesterol, Total: 145 mg/dL (ref 100–199)
HDL: 42 mg/dL (ref >39)
LDL Chol Calc (NIH): 73 mg/dL (ref 0–99)
LDL/HDL Ratio: 1.7 ratio (ref 0.0–3.2)
Triglycerides: 174 mg/dL — ABNORMAL HIGH (ref 0–149)
VLDL Cholesterol Cal: 30 mg/dL (ref 5–40)

## 2021-10-29 MED ORDER — FLUTICASONE PROPIONATE 50 MCG/ACT NA SUSP: 1.0000 | Freq: Every day | NASAL | 1 refills | Status: DC

## 2021-11-12 ENCOUNTER — Ambulatory Visit
Admission: EM | Admit: 2021-11-12 | Discharge: 2021-11-12 | Disposition: A | Discharge status: Home / Self Care | Payer: Medicare Other | Attending: Emergency Medicine | Admitting: Emergency Medicine

## 2021-11-12 ENCOUNTER — Other Ambulatory Visit: Payer: Self-pay

## 2021-11-12 ENCOUNTER — Ambulatory Visit: Payer: Self-pay | Admitting: *Deleted

## 2021-11-12 DIAGNOSIS — R110 Nausea: Secondary | ICD-10-CM

## 2021-11-12 DIAGNOSIS — U071 COVID-19: Secondary | ICD-10-CM

## 2021-11-12 DIAGNOSIS — G43109 Migraine with aura, not intractable, without status migrainosus: Secondary | ICD-10-CM

## 2021-11-12 MED ORDER — ONDANSETRON HCL 4 MG PO TABS: 4.0000 mg | ORAL_TABLET | Freq: Three times a day (TID) | ORAL | 2 refills | Status: DC | PRN

## 2021-11-12 MED ORDER — MOLNUPIRAVIR 200 MG PO CAPS
4.0000 | ORAL_CAPSULE | Freq: Two times a day (BID) | ORAL | 0 refills | Status: AC
Start: 2021-11-12 — End: 2021-11-17

## 2021-11-12 NOTE — Telephone Encounter (Signed)
2nd attempt to contact patient to review symptoms of covid. Patient reports she would like to cancel My Chart VV with Dr. Army Melia tomorrow due to she just went to [facility] for evaluation and received antiviral medication , molnupiravir 800 mg 2 x day for covid. Reviewed with patient to stay hydrated and to call back if symptoms worsen.

## 2021-11-12 NOTE — ED Triage Notes (Signed)
Pt here with C/LO cough, chest congestion since Saturday. Pt was Positive Covid at home today. Would like to start Paxlovid today.

## 2021-11-12 NOTE — Telephone Encounter (Signed)
Patient called in to say that she feels horrible has nausea, headache, head congestion, cough, post nasal drip. Took a Covid test that was positive and need to have a confirmation test need to know what to do no appointments available with Dr Ronnald Ramp. Please call patient at  Ph# [phone]   Called patient to review symptoms of covid. No answer. Unable to leave voicemail.

## 2021-11-12 NOTE — Discharge Instructions (Signed)
COVID-19 is a virus and should progressively get better with time  Take the antiviral medication twice a day for the next 5 days  Continue using your Zofran as prescribed  You may continue use of over-the-counter medication as needed  You may follow-up with urgent care as needed

## 2021-11-12 NOTE — ED Provider Notes (Signed)
[facility]    CSN: [id number] Arrival date & time: 11/12/21  1155      History   Chief Complaint Chief Complaint  Patient presents with   Cough   Covid Positive    HPI Nicole Cook is a 70 y.o. female.   Patient presents with chills, nasal congestion, postnasal drip, sinus pain and pressure, nonproductive cough, left-sided abdominal pain occurring once and intermittent generalized headaches for 6 days.  Has attempted use of over-the-counter cold and flu medications which have been somewhat helpful.  No known sick contacts.  Home COVID test positive, requesting use of antiviral medication.  History of arthritis, diabetes mellitus, GERD, hyperlipidemia, hypertension, migraines, MI, sleep apnea.  Past Medical History:  Diagnosis Date   Allergy    Arthritis    Cancer ([place])    basal cell skin ca   Diabetes mellitus without complication (HCC)    GERD (gastroesophageal reflux disease)    Hyperlipidemia    Hypertension    Migraine    Myocardial infarction [facility]) May, 2011   Oxygen deficiency    CPAP   Pelvic floor relaxation    Sleep apnea    Thoracic or lumbosacral neuritis or radiculitis, unspecified     Patient Active Problem List   Diagnosis Date Noted   Hyperlipidemia associated with type 2 diabetes mellitus ([place]) 12/25/2019   Atrophic vaginitis 11/23/2018   Pelvic floor relaxation 11/23/2018   Primary osteoarthritis of right knee 05/12/2018   Sacral back pain 08/04/2017   Taking medication for chronic disease 03/02/2017   Seasonal allergic rhinitis due to pollen 03/02/2017   Gastroesophageal reflux disease 03/02/2017   Migraine with aura and without status migrainosus, not intractable 03/02/2017   Angina pectoris ([place]) 11/09/2016   Chronic right-sided low back pain without sciatica 10/29/2016   Trochanteric bursitis of right hip 07/31/2015   Primary osteoarthritis of right hip 07/31/2015   Type 2 diabetes mellitus with hyperglycemia, with  long-term current use of insulin ([place]) 04/03/2015   Hyperlipidemia 04/03/2015   Laboratory animal allergy 04/03/2015   Cephalalgia 04/03/2015   Essential hypertension 04/03/2015   Blood glucose elevated 04/03/2015   Routine general medical examination at a health care facility 04/03/2015   CAD (coronary artery disease) 04/03/2015   Dermatochalasis of both upper eyelids 09/20/2014   Sleep apnea 09/03/2014   DDD (degenerative disc disease), lumbosacral 03/03/2012   Spinal stenosis of lumbar region without neurogenic claudication 03/03/2012   Thoracic or lumbosacral neuritis or radiculitis, unspecified 03/03/2012    Past Surgical History:  Procedure Laterality Date   back surgery x 2     c-section x [place]   COLONOSCOPY  2011   cleared for 5 yrs- Dr Beckey Downing   EYE SURGERY     eyelid lesion   KNEE ARTHROSCOPY Right 2010   SPINE SURGERY  2003,2006   TUBAL LIGATION  1982   VAGINAL HYSTERECTOMY  12/14/1994    OB History   No obstetric history on file.      Home Medications    Prior to Admission medications   Medication Sig Start Date End Date Taking? Authorizing Provider  ACCU-CHEK FASTCLIX LANCETS MISC Accu-Chek Fastclix Lancet Drum   Yes [provider]  amLODipine (NORVASC) 5 MG tablet Take 1 tablet (5 mg total) by mouth daily. 10/28/21  Yes Juline Patch, MD  aspirin 81 MG tablet Take 1 tablet by mouth daily.   Yes [provider]  cetirizine (ZYRTEC) 10 MG tablet Take 10 mg by mouth daily. otc   Yes [provider]  Cholecalciferol (VITAMIN D3) 1000 units CAPS Take 1 capsule by mouth daily.   Yes [provider]  Coenzyme Q10 300 MG CAPS Take 1 capsule by mouth daily.   Yes [provider]  Dulaglutide 1.5 MG/0.5ML SOPN Inject 1 each into the skin once a week. endo .Marland KitchenSunday   Yes [provider]  estradiol (ESTRING) 2 MG vaginal ring Place 2 mg vaginally every 3 (three) months.  follow package directions- GYN   Yes [provider]  fluticasone (FLONASE) 50 MCG/ACT nasal spray Place 1 spray into both nostrils daily. 10/29/21  Yes Juline Patch, MD  gabapentin (NEURONTIN) 300 MG capsule Take 2 capsules by mouth 3 (three) times daily. [facility]- may take 3 capsules 3 times per day per pt 02/23/13  Yes [provider]  hydrochlorothiazide (HYDRODIURIL) 25 MG tablet Take 1 tablet (25 mg total) by mouth every morning. 10/28/21  Yes Juline Patch, MD  HYDROcodone-acetaminophen (NORCO) 10-325 MG per tablet Take 1 tablet by mouth as needed. [facility] Gets 40 tabs monthly   Yes [provider]  insulin lispro (HUMALOG) 100 UNIT/ML KwikPen    Yes [provider]  lisinopril (ZESTRIL) 10 MG tablet Take 1 tablet (10 mg total) by mouth daily. 10/28/21  Yes Juline Patch, MD  Magnesium 500 MG CAPS Take 1 capsule by mouth daily.   Yes [provider]  metFORMIN (GLUCOPHAGE) 1000 MG tablet 1 tablet 2 (two) times daily. 11/02/18  Yes [provider]  metoprolol succinate (TOPROL-XL) 25 MG 24 hr tablet TAKE 1 TABLET DAILY (TAKE WITH 50 MG TO EQUAL 75) 10/28/21  Yes Juline Patch, MD  metoprolol succinate (TOPROL-XL) 50 MG 24 hr tablet Take with or immediately following a meal. 10/28/21  Yes Juline Patch, MD  montelukast (SINGULAIR) 10 MG tablet Take 1 tablet (10 mg total) by mouth at bedtime. 10/28/21  Yes Juline Patch, MD  Multiple Vitamins-Minerals (CENTRUM SILVER ULTRA WOMENS PO) Take 1 tablet by mouth daily.   Yes [provider]  nitroGLYCERIN (NITROSTAT) 0.4 MG SL tablet Place 1 tablet under the tongue as needed.   Yes [provider]  olopatadine (PATANOL) 0.1 % ophthalmic solution Place 1 drop into both eyes 2 (two) times daily. 11/20/19  Yes Juline Patch, MD  ondansetron (ZOFRAN) 4 MG tablet Take 1 tablet (4 mg total) by mouth every 8 (eight) hours as needed for nausea or vomiting. 10/28/21  Yes  Juline Patch, MD  ONE TOUCH ULTRA TEST test strip  09/22/18  Yes [provider]  OXYCONTIN 10 MG 12 hr tablet Take 10 mg by mouth 2 (two) times daily. Pain Dr 02/22/17  Yes [provider]  pantoprazole (PROTONIX) 40 MG tablet Take 1 tablet (40 mg total) by mouth daily. 10/28/21  Yes Juline Patch, MD  rosuvastatin (CRESTOR) 20 MG tablet TAKE 1 TABLET DAILY 10/28/21  Yes Juline Patch, MD  SUMAtriptan (IMITREX) 50 MG tablet Take 1 tablet (50 mg total) by mouth as needed. Per 90 days 10/28/21  Yes Juline Patch, MD  tiZANidine (ZANAFLEX) 4 MG tablet tizanidine 4 mg tablet  TAKE 1-2 TABLETS BY MOUTH AT NIGHT AT BEDTIME   Yes [provider]  vitamin B-12 (CYANOCOBALAMIN) 1000 MCG tablet Take 1,000 mcg by mouth daily.   Yes [provider]    Family History  Family History  Problem Relation Age of Onset   Breast cancer Mother 30   Arthritis Mother    Cancer Mother    COPD Mother    Diabetes Mother    Hearing loss Mother    Heart disease Mother    Hypertension Mother    Stroke Mother    Breast cancer Sister 11   Cancer Sister    Breast cancer Paternal Aunt    Cancer Paternal Aunt    Parkinson's disease Father    Diabetes Father    Heart disease Father    Alcohol abuse Brother    Cancer Paternal Grandmother    Diabetes Brother    Diabetes Brother    Stroke Maternal Grandfather     Social History Social History   Tobacco Use   Smoking status: Never   Smokeless tobacco: Never  Vaping Use   Vaping Use: Never used  Substance Use Topics   Alcohol use: No    Alcohol/week: 0.0 standard drinks   Drug use: No     Allergies   Aspartame, Compazine [prochlorperazine], Penicillins, Sulfa antibiotics, and Sulfamethoxazole-trimethoprim   Review of Systems Review of Systems  Constitutional:  Positive for chills. Negative for activity change, appetite change, diaphoresis, fatigue, fever and unexpected weight change.  HENT:  Positive for  congestion, postnasal drip and sinus pain. Negative for dental problem, drooling, ear discharge, ear pain, facial swelling, hearing loss, mouth sores, nosebleeds, rhinorrhea, sinus pressure, sneezing, sore throat, tinnitus, trouble swallowing and voice change.   Respiratory:  Positive for cough. Negative for apnea, choking, chest tightness, shortness of breath, wheezing and stridor.   Cardiovascular: Negative.   Gastrointestinal:  Positive for abdominal pain. Negative for abdominal distention, anal bleeding, blood in stool, constipation, diarrhea, nausea, rectal pain and vomiting.  Skin: Negative.   Neurological:  Positive for headaches. Negative for dizziness, tremors, seizures, syncope, facial asymmetry, speech difficulty, weakness, light-headedness and numbness.    Physical Exam Triage Vital Signs ED Triage Vitals  Enc Vitals Group     BP 11/12/21 1321 (!) 147/65     Pulse Rate 11/12/21 1321 67     Resp 11/12/21 1321 18     Temp 11/12/21 1321 98.1 F (36.7 C)     Temp Source 11/12/21 1321 Oral     SpO2 11/12/21 1321 100 %     Weight 11/12/21 1318 168 lb 3.2 oz (76.3 kg)     Height 11/12/21 1318 5\' 5"  (1.651 m)     Head Circumference --      Peak Flow --      Pain Score 11/12/21 1318 0     Pain Loc --      Pain Edu? --      Excl. in [place]? --    No data found.  Updated Vital Signs BP (!) 147/65 (BP Location: Left Arm)   Pulse 67   Temp 98.1 F (36.7 C) (Oral)   Resp 18   Ht 5\' 5"  (1.651 m)   Wt 168 lb 3.2 oz (76.3 kg)   SpO2 100%   BMI 27.99 kg/m   Visual Acuity Right Eye Distance:   Left Eye Distance:   Bilateral Distance:    Right Eye Near:   Left Eye Near:    Bilateral Near:     Physical Exam Constitutional:      Appearance: Normal appearance. She is normal weight.  HENT:     Head: Normocephalic.     Right Ear: Tympanic membrane, ear canal and  external ear normal.     Left Ear: Tympanic membrane, ear canal and external ear normal.     Nose: Congestion and  rhinorrhea present.     Mouth/Throat:     Mouth: Mucous membranes are moist.     Pharynx: Posterior oropharyngeal erythema present.  Eyes:     Extraocular Movements: Extraocular movements intact.  Cardiovascular:     Rate and Rhythm: Normal rate and regular rhythm.     Pulses: Normal pulses.     Heart sounds: Normal heart sounds.  Pulmonary:     Effort: Pulmonary effort is normal.     Breath sounds: Normal breath sounds.  Musculoskeletal:     Cervical back: Normal range of motion and neck supple.  Skin:    General: Skin is warm and dry.  Neurological:     Mental Status: She is alert and oriented to person, place, and time. Mental status is at baseline.  Psychiatric:        Mood and Affect: Mood normal.        Behavior: Behavior normal.     UC Treatments / Results  Labs (all labs ordered are listed, but only abnormal results are displayed) Labs Reviewed - No data to display  EKG   Radiology No results found.  Procedures Procedures (including critical care time)  Medications Ordered in UC Medications - No data to display  Initial Impression / Assessment and Plan / UC Course  I have reviewed the triage vital signs and the nursing notes.  Pertinent labs & imaging results that were available during my care of the patient were reviewed by me and considered in my medical decision making (see chart for details).  COVID-19  Even though patient is past the 5-day window for antiviral treatment due to comorbidities will attempt use of this medication even if slightly less effective, discussed with patient, in agreement with plan of care  Molnupiravir 800 mg twice daily for 5 days 2.  Zofran 4 mg every 8 hours as needed refill, patient has been using medication more due to having to take to get down medications 3.  Continue use of over-the-counter medications for symptom management 4.  Urgent care follow-up as needed Final Clinical Impressions(s) / UC Diagnoses   Final  diagnoses:  None   Discharge Instructions   None    ED Prescriptions   None    PDMP not reviewed this encounter.   Hans Eden, NP 11/12/21 1406

## 2021-11-13 ENCOUNTER — Telehealth: Payer: Medicare Other | Admitting: Internal Medicine

## 2021-12-30 ENCOUNTER — Telehealth: Payer: Medicare Other

## 2021-12-31 ENCOUNTER — Ambulatory Visit: Payer: Medicare Other

## 2022-01-07 ENCOUNTER — Ambulatory Visit: Payer: Medicare Other

## 2022-01-12 DIAGNOSIS — I2510 Atherosclerotic heart disease of native coronary artery without angina pectoris: Secondary | ICD-10-CM | POA: Diagnosis not present

## 2022-01-14 ENCOUNTER — Encounter: Payer: Self-pay | Admitting: Family Medicine

## 2022-01-14 ENCOUNTER — Ambulatory Visit (INDEPENDENT_AMBULATORY_CARE_PROVIDER_SITE_OTHER): Payer: Medicare Other

## 2022-01-14 ENCOUNTER — Ambulatory Visit (INDEPENDENT_AMBULATORY_CARE_PROVIDER_SITE_OTHER): Payer: Medicare Other | Admitting: Family Medicine

## 2022-01-14 ENCOUNTER — Other Ambulatory Visit: Payer: Self-pay

## 2022-01-14 VITALS — BP 120/60 | HR 72 | Ht 65.0 in | Wt 169.0 lb

## 2022-01-14 DIAGNOSIS — Z0000 Encounter for general adult medical examination without abnormal findings: Secondary | ICD-10-CM

## 2022-01-14 DIAGNOSIS — N342 Other urethritis: Secondary | ICD-10-CM | POA: Diagnosis not present

## 2022-01-14 LAB — POCT URINALYSIS DIPSTICK
Bilirubin, UA: NEGATIVE
Blood, UA: NEGATIVE
Glucose, UA: NEGATIVE
Ketones, UA: NEGATIVE
Leukocytes, UA: NEGATIVE
Nitrite, UA: NEGATIVE
Protein, UA: NEGATIVE
Spec Grav, UA: <=1.005 — AB (ref 1.010–1.025)
Urobilinogen, UA: 0.2 E.U./dL
pH, UA: 5 (ref 5.0–8.0)

## 2022-01-14 MED ORDER — NITROFURANTOIN MACROCRYSTAL 100 MG PO CAPS: 100.0000 mg | ORAL_CAPSULE | Freq: Two times a day (BID) | ORAL | 0 refills | Status: DC

## 2022-01-14 MED ORDER — FLUCONAZOLE 150 MG PO TABS: 150.0000 mg | ORAL_TABLET | Freq: Once | ORAL | 0 refills | Status: AC

## 2022-01-14 NOTE — Progress Notes (Signed)
Date:  01/14/2022   Name:  Nicole Cook   DOB:  07/26/1951   MRN:  [id number]   Chief Complaint: Urinary Tract Infection (Started on Saturday with burning at end of stream and some flank pain)  Urinary Tract Infection  This is a new problem. The current episode started in the past 7 days. The problem occurs intermittently. The problem has been waxing and waning. The quality of the pain is described as burning. The pain is mild. There has been no fever. She is Sexually active. Associated symptoms include frequency and urgency. Pertinent negatives include no hematuria. Treatments tried: P med.   Lab Results  Component Value Date   NA 140 10/28/2021   K 4.6 10/28/2021   CO2 25 10/28/2021   GLUCOSE 151 (H) 10/28/2021   BUN 15 10/28/2021   CREATININE 0.82 10/28/2021   CALCIUM 9.5 10/28/2021   EGFR 76 10/28/2021   GFRNONAA 74 11/21/2020   Lab Results  Component Value Date   CHOL 145 10/28/2021   HDL 42 10/28/2021   LDLCALC 73 10/28/2021   TRIG 174 (H) 10/28/2021   CHOLHDL 3.3 03/09/2018   No results found for: TSH Lab Results  Component Value Date   HGBA1C 6.2 09/19/2021   Lab Results  Component Value Date   WBC 5.0 10/10/2018   HGB 12.4 10/10/2018   HCT 37.2 10/10/2018   MCV 86 10/10/2018   PLT 185 10/10/2018   Lab Results  Component Value Date   ALT 26 10/28/2021   AST 22 10/28/2021   ALKPHOS 46 10/28/2021   BILITOT 0.3 10/28/2021   No results found for: 25OHVITD2, 25OHVITD3, VD25OH   Review of Systems  Genitourinary:  Positive for frequency and urgency. Negative for hematuria.   Patient Active Problem List   Diagnosis Date Noted   Hyperlipidemia associated with type 2 diabetes mellitus ([place]) 12/25/2019   Atrophic vaginitis 11/23/2018   Pelvic floor relaxation 11/23/2018   Primary osteoarthritis of right knee 05/12/2018   Sacral back pain 08/04/2017   Taking medication for chronic disease 03/02/2017   Seasonal allergic rhinitis due to pollen  03/02/2017   Gastroesophageal reflux disease 03/02/2017   Migraine with aura and without status migrainosus, not intractable 03/02/2017   Angina pectoris ([place]) 11/09/2016   Chronic right-sided low back pain without sciatica 10/29/2016   Trochanteric bursitis of right hip 07/31/2015   Primary osteoarthritis of right hip 07/31/2015   Type 2 diabetes mellitus with hyperglycemia, with long-term current use of insulin ([place]) 04/03/2015   Hyperlipidemia 04/03/2015   Laboratory animal allergy 04/03/2015   Cephalalgia 04/03/2015   Essential hypertension 04/03/2015   Blood glucose elevated 04/03/2015   Routine general medical examination at a health care facility 04/03/2015   CAD (coronary artery disease) 04/03/2015   Dermatochalasis of both upper eyelids 09/20/2014   Sleep apnea 09/03/2014   DDD (degenerative disc disease), lumbosacral 03/03/2012   Spinal stenosis of lumbar region without neurogenic claudication 03/03/2012   Thoracic or lumbosacral neuritis or radiculitis, unspecified 03/03/2012    Allergies  Allergen Reactions   Aspartame Other (See Comments)   Compazine [Prochlorperazine]    Penicillins    Sulfa Antibiotics    Sulfamethoxazole-Trimethoprim Hives and Other (See Comments)    Other Reaction: Not Assessed     Past Surgical History:  Procedure Laterality Date   back surgery x 2     c-section x [place]   COLONOSCOPY  2011  cleared for 5 yrs- Dr Beckey Downing   EYE SURGERY     eyelid lesion   KNEE ARTHROSCOPY Right 2010   SPINE SURGERY  2003,2006   [place]   VAGINAL HYSTERECTOMY  12/14/1994    Social History   Tobacco Use   Smoking status: Never   Smokeless tobacco: Never  Vaping Use   Vaping Use: Never used  Substance Use Topics   Alcohol use: No    Alcohol/week: 0.0 standard drinks   Drug use: No     Medication list has been reviewed and updated.  Current Meds  Medication Sig   ACCU-CHEK FASTCLIX LANCETS  MISC Accu-Chek Fastclix Lancet Drum   amLODipine (NORVASC) 5 MG tablet Take 1 tablet (5 mg total) by mouth daily.   aspirin 81 MG tablet Take 1 tablet by mouth daily.   cetirizine (ZYRTEC) 10 MG tablet Take 10 mg by mouth daily. otc   Cholecalciferol (VITAMIN D3) 1000 units CAPS Take 1 capsule by mouth daily.   Coenzyme Q10 300 MG CAPS Take 1 capsule by mouth daily.   Dulaglutide 1.5 MG/0.5ML SOPN Inject 1 each into the skin once a week. endo .Marland KitchenSunday   estradiol (ESTRING) 2 MG vaginal ring Place 2 mg vaginally every 3 (three) months. follow package directions- GYN   fluticasone (FLONASE) 50 MCG/ACT nasal spray Place 1 spray into both nostrils daily.   gabapentin (NEURONTIN) 300 MG capsule Take 2 capsules by mouth 3 (three) times daily. [facility]- may take 3 capsules 3 times per day per pt   hydrochlorothiazide (HYDRODIURIL) 25 MG tablet Take 1 tablet (25 mg total) by mouth every morning.   HYDROcodone-acetaminophen (NORCO) 10-325 MG per tablet Take 1 tablet by mouth as needed. [facility] Gets 40 tabs monthly   insulin lispro (HUMALOG) 100 UNIT/ML KwikPen    lisinopril (ZESTRIL) 10 MG tablet Take 1 tablet (10 mg total) by mouth daily.   Magnesium 500 MG CAPS Take 1 capsule by mouth daily.   metFORMIN (GLUCOPHAGE) 1000 MG tablet 1 tablet 2 (two) times daily.   metoprolol succinate (TOPROL-XL) 25 MG 24 hr tablet TAKE 1 TABLET DAILY (TAKE WITH 50 MG TO EQUAL 75)   metoprolol succinate (TOPROL-XL) 50 MG 24 hr tablet Take with or immediately following a meal.   montelukast (SINGULAIR) 10 MG tablet Take 1 tablet (10 mg total) by mouth at bedtime.   Multiple Vitamins-Minerals (CENTRUM SILVER ULTRA WOMENS PO) Take 1 tablet by mouth daily.   nitroGLYCERIN (NITROSTAT) 0.4 MG SL tablet Place 1 tablet under the tongue as needed.   olopatadine (PATANOL) 0.1 % ophthalmic solution Place 1 drop into both eyes 2 (two) times daily.   ondansetron (ZOFRAN) 4 MG tablet Take 1 tablet (4 mg total) by mouth every 8  (eight) hours as needed for nausea or vomiting.   ONE TOUCH ULTRA TEST test strip    OXYCONTIN 10 MG 12 hr tablet Take 10 mg by mouth 2 (two) times daily. Pain Dr   pantoprazole (PROTONIX) 40 MG tablet Take 1 tablet (40 mg total) by mouth daily.   rosuvastatin (CRESTOR) 20 MG tablet TAKE 1 TABLET DAILY   SUMAtriptan (IMITREX) 50 MG tablet Take 1 tablet (50 mg total) by mouth as needed. Per 90 days   tiZANidine (ZANAFLEX) 4 MG tablet tizanidine 4 mg tablet  TAKE 1-2 TABLETS BY MOUTH AT NIGHT AT BEDTIME   vitamin B-12 (CYANOCOBALAMIN) 1000 MCG tablet Take 1,000 mcg by mouth daily.    PHQ 2/9 Scores  01/14/2022 10/28/2021 05/07/2021 12/25/2020  PHQ - 2 Score 0 0 0 0  PHQ- 9 Score 0 1 0 -    GAD 7 : Generalized Anxiety Score 01/14/2022 10/28/2021 05/07/2021 11/21/2020  Nervous, Anxious, on Edge 0 0 0 0  Control/stop worrying 0 0 0 0  Worry too much - different things 0 0 0 0  Trouble relaxing 0 0 0 0  Restless 0 0 0 0  Easily annoyed or irritable 0 0 0 0  Afraid - awful might happen 0 0 0 0  Total GAD 7 Score 0 0 0 0  Anxiety Difficulty Not difficult at all Not difficult at all - -    BP Readings from Last 3 Encounters:  01/14/22 120/60  11/12/21 (!) 147/65  10/28/21 122/78    Physical Exam Vitals and nursing note reviewed.  Constitutional:      Appearance: She is well-developed.  HENT:     Head: Normocephalic.     Right Ear: Tympanic membrane and external ear normal.     Left Ear: Tympanic membrane and external ear normal.     Nose: No congestion or rhinorrhea.  Eyes:     General: Lids are everted, no foreign bodies appreciated. No scleral icterus.       Left eye: No foreign body or hordeolum.     Conjunctiva/sclera: Conjunctivae normal.     Right eye: Right conjunctiva is not injected.     Left eye: Left conjunctiva is not injected.     Pupils: Pupils are equal, round, and reactive to light.  Neck:     Thyroid: No thyromegaly.     Vascular: No JVD.     Trachea: No tracheal  deviation.  Cardiovascular:     Rate and Rhythm: Normal rate and regular rhythm.     Heart sounds: Normal heart sounds. No murmur heard.   No friction rub. No gallop.  Pulmonary:     Effort: Pulmonary effort is normal. No respiratory distress.     Breath sounds: Normal breath sounds. No wheezing or rales.  Abdominal:     General: Bowel sounds are normal.     Palpations: Abdomen is soft. There is no mass.     Tenderness: There is no abdominal tenderness. There is no right CVA tenderness, left CVA tenderness, guarding or rebound.  Musculoskeletal:        General: No tenderness. Normal range of motion.     Cervical back: Normal range of motion and neck supple.  Lymphadenopathy:     Cervical: No cervical adenopathy.  Skin:    General: Skin is warm.     Findings: No rash.  Neurological:     Mental Status: She is alert and oriented to person, place, and time.     Cranial Nerves: No cranial nerve deficit.     Deep Tendon Reflexes: Reflexes normal.  Psychiatric:        Mood and Affect: Mood is not anxious or depressed.    Wt Readings from Last 3 Encounters:  01/14/22 169 lb (76.7 kg)  11/12/21 168 lb 3.2 oz (76.3 kg)  10/28/21 168 lb 3.2 oz (76.3 kg)    BP 120/60    Pulse 72    Ht $R'5\' 5"'Mq$  (1.651 m)    Wt 169 lb (76.7 kg)    BMI 28.12 kg/m   Assessment and Plan: 1. Urethritis New onset.  Episodic and currently not symptomatic.  Patient over the weekend developed frequency urgency and dysuria.  Initially controlled with  hydration and Pyridium.  Urinalysis was unremarkable but symptomatology may suggest that patient may have had a urethritis that we will treat with Macrodantin 100 mg twice a day for 3 days and patient will consider 4 tablets for activity.  Patient was also given Diflucan tablets for suspected localized candidiasis urethritis. - POCT urinalysis dipstick - nitrofurantoin (MACRODANTIN) 100 MG capsule; Take 1 capsule (100 mg total) by mouth 2 (two) times daily.  Dispense: 10  capsule; Refill: 0 - fluconazole (DIFLUCAN) 150 MG tablet; Take 1 tablet (150 mg total) by mouth once for 1 dose.  Dispense: 1 tablet; Refill: 0

## 2022-01-14 NOTE — Patient Instructions (Signed)
Nicole Cook , Thank you for taking time to come for your Medicare Wellness Visit. I appreciate your ongoing commitment to your health goals. Please review the following plan we discussed and let me know if I can assist you in the future.   Screening recommendations/referrals: Colonoscopy: done 01/24/18. Repeat 01/2023 Mammogram: done 05/13/21 Bone Density: done 01/17/20 Recommended yearly ophthalmology/optometry visit for glaucoma screening and checkup Recommended yearly dental visit for hygiene Influenza vaccine: done 09/13/21 Pneumococcal vaccine: done 08/29/14 Tdap vaccine: done 03/02/17 Shingles vaccine: Shingrix discussed. Please contact your pharmacy for coverage information.  Covid-19:done 01/24/20, 02/14/20 & 10/31/20  Advanced directives: Please bring a copy of your health care power of attorney and living will to the office at your convenience once you have completed those documents.   Conditions/risks identified: Keep up the great work!  Next appointment: Follow up in one year for your annual wellness visit    Preventive Care 65 Years and Older, Female Preventive care refers to lifestyle choices and visits with your health care provider that can promote health and wellness. What does preventive care include? A yearly physical exam. This is also called an annual well check. Dental exams once or twice a year. Routine eye exams. Ask your health care provider how often you should have your eyes checked. Personal lifestyle choices, including: Daily care of your teeth and gums. Regular physical activity. Eating a healthy diet. Avoiding tobacco and drug use. Limiting alcohol use. Practicing safe sex. Taking low-dose aspirin every day. Taking vitamin and mineral supplements as recommended by your health care provider. What happens during an annual well check? The services and screenings done by your health care provider during your annual well check will depend on your age, overall  health, lifestyle risk factors, and family history of disease. Counseling  Your health care provider may ask you questions about your: Alcohol use. Tobacco use. Drug use. Emotional well-being. Home and relationship well-being. Sexual activity. Eating habits. History of falls. Memory and ability to understand (cognition). Work and work Statistician. Reproductive health. Screening  You may have the following tests or measurements: Height, weight, and BMI. Blood pressure. Lipid and cholesterol levels. These may be checked every 5 years, or more frequently if you are over 45 years old. Skin check. Lung cancer screening. You may have this screening every year starting at age 55 if you have a 30-pack-year history of smoking and currently smoke or have quit within the past 15 years. Fecal occult blood test (FOBT) of the stool. You may have this test every year starting at age 90. Flexible sigmoidoscopy or colonoscopy. You may have a sigmoidoscopy every 5 years or a colonoscopy every 10 years starting at age 23. Hepatitis C blood test. Hepatitis B blood test. Sexually transmitted disease (STD) testing. Diabetes screening. This is done by checking your blood sugar (glucose) after you have not eaten for a while (fasting). You may have this done every 1-3 years. Bone density scan. This is done to screen for osteoporosis. You may have this done starting at age 15. Mammogram. This may be done every 1-2 years. Talk to your health care provider about how often you should have regular mammograms. Talk with your health care provider about your test results, treatment options, and if necessary, the need for more tests. Vaccines  Your health care provider may recommend certain vaccines, such as: Influenza vaccine. This is recommended every year. Tetanus, diphtheria, and acellular pertussis (Tdap, Td) vaccine. You may need a Td booster every 10  years. Zoster vaccine. You may need this after age  72. Pneumococcal 13-valent conjugate (PCV13) vaccine. One dose is recommended after age 33. Pneumococcal polysaccharide (PPSV23) vaccine. One dose is recommended after age 87. Talk to your health care provider about which screenings and vaccines you need and how often you need them. This information is not intended to replace advice given to you by your health care provider. Make sure you discuss any questions you have with your health care provider. Document Released: 12/27/2015 Document Revised: 08/19/2016 Document Reviewed: 10/01/2015 Elsevier Interactive Patient Education  2017 [place] Prevention in the Home Falls can cause injuries. They can happen to people of all ages. There are many things you can do to make your home safe and to help prevent falls. What can I do on the outside of my home? Regularly fix the edges of walkways and driveways and fix any cracks. Remove anything that might make you trip as you walk through a door, such as a raised step or threshold. Trim any bushes or trees on the path to your home. Use bright outdoor lighting. Clear any walking paths of anything that might make someone trip, such as rocks or tools. Regularly check to see if handrails are loose or broken. Make sure that both sides of any steps have handrails. Any raised decks and porches should have guardrails on the edges. Have any leaves, snow, or ice cleared regularly. Use sand or salt on walking paths during winter. Clean up any spills in your garage right away. This includes oil or grease spills. What can I do in the bathroom? Use night lights. Install grab bars by the toilet and in the tub and shower. Do not use towel bars as grab bars. Use non-skid mats or decals in the tub or shower. If you need to sit down in the shower, use a plastic, non-slip stool. Keep the floor dry. Clean up any water that spills on the floor as soon as it happens. Remove soap buildup in the tub or shower  regularly. Attach bath mats securely with double-sided non-slip rug tape. Do not have throw rugs and other things on the floor that can make you trip. What can I do in the bedroom? Use night lights. Make sure that you have a light by your bed that is easy to reach. Do not use any sheets or blankets that are too big for your bed. They should not hang down onto the floor. Have a firm chair that has side arms. You can use this for support while you get dressed. Do not have throw rugs and other things on the floor that can make you trip. What can I do in the kitchen? Clean up any spills right away. Avoid walking on wet floors. Keep items that you use a lot in easy-to-reach places. If you need to reach something above you, use a strong step stool that has a grab bar. Keep electrical cords out of the way. Do not use floor polish or wax that makes floors slippery. If you must use wax, use non-skid floor wax. Do not have throw rugs and other things on the floor that can make you trip. What can I do with my stairs? Do not leave any items on the stairs. Make sure that there are handrails on both sides of the stairs and use them. Fix handrails that are broken or loose. Make sure that handrails are as long as the stairways. Check any carpeting to make sure  that it is firmly attached to the stairs. Fix any carpet that is loose or worn. Avoid having throw rugs at the top or bottom of the stairs. If you do have throw rugs, attach them to the floor with carpet tape. Make sure that you have a light switch at the top of the stairs and the bottom of the stairs. If you do not have them, ask someone to add them for you. What else can I do to help prevent falls? Wear shoes that: Do not have high heels. Have rubber bottoms. Are comfortable and fit you well. Are closed at the toe. Do not wear sandals. If you use a stepladder: Make sure that it is fully opened. Do not climb a closed stepladder. Make sure that  both sides of the stepladder are locked into place. Ask someone to hold it for you, if possible. Clearly mark and make sure that you can see: Any grab bars or handrails. First and last steps. Where the edge of each step is. Use tools that help you move around (mobility aids) if they are needed. These include: Canes. Walkers. Scooters. Crutches. Turn on the lights when you go into a dark area. Replace any light bulbs as soon as they burn out. Set up your furniture so you have a clear path. Avoid moving your furniture around. If any of your floors are uneven, fix them. If there are any pets around you, be aware of where they are. Review your medicines with your doctor. Some medicines can make you feel dizzy. This can increase your chance of falling. Ask your doctor what other things that you can do to help prevent falls. This information is not intended to replace advice given to you by your health care provider. Make sure you discuss any questions you have with your health care provider. Document Released: 09/26/2009 Document Revised: 05/07/2016 Document Reviewed: 01/04/2015 Elsevier Interactive Patient Education  2017 [employer].

## 2022-01-14 NOTE — Progress Notes (Signed)
Subjective:   Nicole Cook is a 71 y.o. female who presents for Medicare Annual (Subsequent) preventive examination.  Virtual Visit via Telephone Note  I connected with  Nicole Cook on 01/14/22 at  3:20 PM EST by telephone and verified that I am speaking with the correct person using two identifiers.  Location: Patient: home Provider: [facility] Persons participating in the virtual visit: [place]   I discussed the limitations, risks, security and privacy concerns of performing an evaluation and management service by telephone and the availability of in person appointments. The patient expressed understanding and agreed to proceed.  Interactive audio and video telecommunications were attempted between this nurse and patient, however failed, due to patient having technical difficulties OR patient did not have access to video capability.  We continued and completed visit with audio only.  Some vital signs may be absent or patient reported.   Clemetine Marker, LPN   Review of Systems     Cardiac Risk Factors include: advanced age (>34men, >27 women);dyslipidemia;hypertension;diabetes mellitus     Objective:    Today's Vitals   01/14/22 1531  PainSc: 3    There is no height or weight on file to calculate BMI.  Advanced Directives 01/14/2022 11/12/2021 12/25/2020 12/25/2019 11/23/2018 11/15/2017 10/07/2015  Does Patient Have a Medical Advance Directive? No No No No No No No  Would patient like information on creating a medical advance directive? No - Patient declined - No - Patient declined Yes (MAU/Ambulatory/Procedural Areas - Information given) Yes (MAU/Ambulatory/Procedural Areas - Information given) Yes (MAU/Ambulatory/Procedural Areas - Information given) No - patient declined information    Current Medications (verified) Outpatient Encounter Medications as of 01/14/2022  Medication Sig   ACCU-CHEK FASTCLIX LANCETS MISC Accu-Chek Fastclix Lancet Drum    amLODipine (NORVASC) 5 MG tablet Take 1 tablet (5 mg total) by mouth daily.   aspirin 81 MG tablet Take 1 tablet by mouth daily.   cetirizine (ZYRTEC) 10 MG tablet Take 10 mg by mouth daily. otc   Cholecalciferol (VITAMIN D3) 1000 units CAPS Take 1 capsule by mouth daily.   Coenzyme Q10 300 MG CAPS Take 1 capsule by mouth daily.   Dulaglutide 1.5 MG/0.5ML SOPN Inject 1 each into the skin once a week. endo .Marland KitchenSunday   estradiol (ESTRING) 2 MG vaginal ring Place 2 mg vaginally every 3 (three) months. follow package directions- GYN   fluconazole (DIFLUCAN) 150 MG tablet Take 1 tablet (150 mg total) by mouth once for 1 dose.   fluticasone (FLONASE) 50 MCG/ACT nasal spray Place 1 spray into both nostrils daily.   gabapentin (NEURONTIN) 300 MG capsule Take 2 capsules by mouth 3 (three) times daily. [facility]- may take 3 capsules 3 times per day per pt   hydrochlorothiazide (HYDRODIURIL) 25 MG tablet Take 1 tablet (25 mg total) by mouth every morning.   HYDROcodone-acetaminophen (NORCO) 10-325 MG per tablet Take 1 tablet by mouth as needed. [facility] Gets 40 tabs monthly   insulin lispro (HUMALOG) 100 UNIT/ML KwikPen    lisinopril (ZESTRIL) 10 MG tablet Take 1 tablet (10 mg total) by mouth daily.   Magnesium 500 MG CAPS Take 1 capsule by mouth daily.   metFORMIN (GLUCOPHAGE) 1000 MG tablet 1 tablet 2 (two) times daily.   metoprolol succinate (TOPROL-XL) 25 MG 24 hr tablet TAKE 1 TABLET DAILY (TAKE WITH 50 MG TO EQUAL 75)   metoprolol succinate (TOPROL-XL) 50 MG 24 hr tablet Take with or immediately following a meal.  montelukast (SINGULAIR) 10 MG tablet Take 1 tablet (10 mg total) by mouth at bedtime.   Multiple Vitamins-Minerals (CENTRUM SILVER ULTRA WOMENS PO) Take 1 tablet by mouth daily.   nitrofurantoin (MACRODANTIN) 100 MG capsule Take 1 capsule (100 mg total) by mouth 2 (two) times daily.   nitroGLYCERIN (NITROSTAT) 0.4 MG SL tablet Place 1 tablet under the tongue as needed.   olopatadine  (PATANOL) 0.1 % ophthalmic solution Place 1 drop into both eyes 2 (two) times daily.   ondansetron (ZOFRAN) 4 MG tablet Take 1 tablet (4 mg total) by mouth every 8 (eight) hours as needed for nausea or vomiting.   ONE TOUCH ULTRA TEST test strip    OXYCONTIN 10 MG 12 hr tablet Take 10 mg by mouth 2 (two) times daily. Pain Dr   pantoprazole (PROTONIX) 40 MG tablet Take 1 tablet (40 mg total) by mouth daily.   rosuvastatin (CRESTOR) 20 MG tablet TAKE 1 TABLET DAILY   SUMAtriptan (IMITREX) 50 MG tablet Take 1 tablet (50 mg total) by mouth as needed. Per 90 days   tiZANidine (ZANAFLEX) 4 MG tablet tizanidine 4 mg tablet  TAKE 1-2 TABLETS BY MOUTH AT NIGHT AT BEDTIME   vitamin B-12 (CYANOCOBALAMIN) 1000 MCG tablet Take 1,000 mcg by mouth daily.   No facility-administered encounter medications on file as of 01/14/2022.    Allergies (verified) Aspartame, Compazine [prochlorperazine], Penicillins, Sulfa antibiotics, and Sulfamethoxazole-trimethoprim   History: Past Medical History:  Diagnosis Date   Age-related nuclear cataract of both eyes 03/09/2021   Allergy    Arthritis    Cancer ([place])    basal cell skin ca   Diabetes mellitus without complication ([place])    GERD (gastroesophageal reflux disease)    Hyperlipidemia    Hypertension    Migraine    Myocardial infarction [facility]) May, 2011   Oxygen deficiency    CPAP   Pelvic floor relaxation    Sleep apnea    Thoracic or lumbosacral neuritis or radiculitis, unspecified    Past Surgical History:  Procedure Laterality Date   back surgery x 2     c-section x 3     [place], 1979, 1982   COLONOSCOPY  2011   cleared for 5 yrs- Dr Beckey Downing   EYE SURGERY     eyelid lesion   KNEE ARTHROSCOPY Right 2010   SPINE SURGERY  2003,2006   TUBAL LIGATION  1982   VAGINAL HYSTERECTOMY  12/14/1994   Family History  Problem Relation Age of Onset   Breast cancer Mother 80   Arthritis Mother    Cancer Mother    COPD Mother     Diabetes Mother    Hearing loss Mother    Heart disease Mother    Hypertension Mother    Stroke Mother    Breast cancer Sister 77   Cancer Sister    Breast cancer Paternal Aunt    Cancer Paternal Aunt    Parkinson's disease Father    Diabetes Father    Heart disease Father    Alcohol abuse Brother    Cancer Paternal Grandmother    Diabetes Brother    Diabetes Brother    Stroke Maternal Grandfather    Social History   Socioeconomic History   Marital status: Married    Spouse name: Not on file   Number of children: 3   Years of education: Not on file   Highest education level: Bachelor's degree (e.g., BA, AB, BS)  Occupational History  Occupation: Retired  Tobacco Use   Smoking status: Never   Smokeless tobacco: Never  Vaping Use   Vaping Use: Never used  Substance and Sexual Activity   Alcohol use: No    Alcohol/week: 0.0 standard drinks   Drug use: No   Sexual activity: Yes    Birth control/protection: Post-menopausal  Other Topics Concern   Not on file  Social History Narrative   Not on file   Social Determinants of Health   Financial Resource Strain: Low Risk    Difficulty of Paying Living Expenses: Not hard at all  Food Insecurity: No Food Insecurity   Worried About Charity fundraiser in the Last Year: Never true   [place] in the Last Year: Never true  Transportation Needs: No Transportation Needs   Lack of Transportation (Medical): No   Lack of Transportation (Non-Medical): No  Physical Activity: Insufficiently Active   Days of Exercise per Week: 5 days   Minutes of Exercise per Session: 20 min  Stress: No Stress Concern Present   Feeling of Stress : Not at all  Social Connections: Moderately Integrated   Frequency of Communication with Friends and Family: More than three times a week   Frequency of Social Gatherings with Friends and Family: Three times a week   Attends Religious Services: More than 4 times per year   Active Member of Clubs  or Organizations: No   Attends Archivist Meetings: Never   Marital Status: Married    Tobacco Counseling Counseling given: Not Answered   Clinical Intake:  Pre-visit preparation completed: Yes  Pain : 0-10 Pain Score: 3  Pain Type: Chronic pain Pain Location: Back (also bladder) Pain Orientation: Lower Pain Descriptors / Indicators: Aching, Sore Pain Onset: More than a month ago Pain Frequency: Constant     Nutritional Risks: None Diabetes: Yes CBG done?: No Did pt. bring in CBG monitor from home?: No  How often do you need to have someone help you when you read instructions, pamphlets, or other written materials from your doctor or pharmacy?: 1 - Never  Nutrition Risk Assessment:  Has the patient had any N/V/D within the last 2 months?  No  Does the patient have any non-healing wounds?  No  Has the patient had any unintentional weight loss or weight gain?  No   Diabetes:  Is the patient diabetic?  Yes  If diabetic, was a CBG obtained today?  No  Did the patient bring in their glucometer from home?  No  How often do you monitor your CBG's? 1-2 times daily.   Financial Strains and Diabetes Management:  Are you having any financial strains with the device, your supplies or your medication? No .  Does the patient want to be seen by Chronic Care Management for management of their diabetes?  No  Would the patient like to be referred to a Nutritionist or for Diabetic Management?  No   Diabetic Exams:  Diabetic Eye Exam: Completed 01/28/21 negative retinopathy.   Diabetic Foot Exam: Completed 03/17/21.     Interpreter Needed?: No  Information entered by :: Clemetine Marker LPN   Activities of Daily Living In your present state of health, do you have any difficulty performing the following activities: 01/14/2022 10/28/2021  Hearing? N N  Vision? N N  Difficulty concentrating or making decisions? N N  Walking or climbing stairs? N Y  Dressing or bathing? N  N  Doing errands, shopping? N N  Preparing Food and eating ? N -  Using the Toilet? N -  In the past six months, have you accidently leaked urine? Y -  Comment wears liners for protection -  Do you have problems with loss of bowel control? N -  Managing your Medications? N -  Managing your Finances? N -  Housekeeping or managing your Housekeeping? N -  Some recent data might be hidden    Patient Care Team: Juline Patch, MD as PCP - General (Family Medicine) Vergia Alcon, MD as Consulting Physician (Cardiology) Krystal Eaton, MD as Consulting Physician (Occupational Medicine) Enos Fling, MD as Consulting Physician (Obstetrics and Gynecology) Lonia Farber, MD as Consulting Physician (Internal Medicine)  Indicate any recent Medical Services you may have received from other than [facility] providers in the past year (date may be approximate).     Assessment:   This is a routine wellness examination for Nicole Cook.  Hearing/Vision screen Hearing Screening - Comments:: Pt denies hearing difficulty Vision Screening - Comments:: [facility]  Dietary issues and exercise activities discussed: Current Exercise Habits: Home exercise routine, Type of exercise: walking, Time (Minutes): 20, Frequency (Times/Week): 5, Weekly Exercise (Minutes/Week): 100, Intensity: Moderate, Exercise limited by: None identified   Goals Addressed             This Visit's Progress    DIET - INCREASE WATER INTAKE   On track    Recommend to drink at least 6-8 8oz glasses of water per day        Depression Screen [facility] 2/9 Scores 01/14/2022 01/14/2022 10/28/2021 05/07/2021 12/25/2020 11/21/2020 05/21/2020  PHQ - 2 Score 0 0 0 0 0 0 0  PHQ- 9 Score - 0 1 0 - 0 0    Fall Risk Fall Risk  01/14/2022 10/28/2021 12/25/2020 11/21/2020 05/21/2020  Falls in the past year? 0 0 0 0 0  Comment - - - - -  Number falls in past yr: 0 0 0 - -  Comment - - - - -  Injury with Fall? 0 0 0  - -  Risk for fall due to : No Fall Risks No Fall Risks No Fall Risks - -  Risk for fall due to: Comment - - - - -  Follow up Falls prevention discussed Falls evaluation completed Falls prevention discussed Falls evaluation completed Falls evaluation completed    FALL RISK PREVENTION PERTAINING TO THE HOME:  Any stairs in or around the home? Yes  If so, are there any without handrails? No  Home free of loose throw rugs in walkways, pet beds, electrical cords, etc? Yes  Adequate lighting in your home to reduce risk of falls? Yes   ASSISTIVE DEVICES UTILIZED TO PREVENT FALLS:  Life alert? No  Use of a cane, walker or w/c? No  Grab bars in the bathroom? Yes  Shower chair or bench in shower? No  Elevated toilet seat or a handicapped toilet? No   TIMED UP AND GO:  Was the test performed? No . Telephonic visit.   Cognitive Function: Normal cognitive status assessed by direct observation by this Nurse Health Advisor. No abnormalities found.       6CIT Screen 12/25/2019 11/15/2017  What Year? 0 points 0 points  What month? 0 points 0 points  What time? 0 points 0 points  Count back from 20 0 points 0 points  Months in reverse 0 points 0 points  Repeat phrase 0 points 4  points  Total Score 0 4    Immunizations Immunization History  Administered Date(s) Administered   Fluad Quad(high Dose 65+) 10/02/2019, 10/08/2020   Hepatitis B 12/14/1998   Influenza, High Dose Seasonal PF 09/06/2017, 09/26/2018   Influenza,inj,Quad PF,6+ Mos 09/02/2015, 09/02/2016   Influenza-Unspecified 09/13/2012, 08/14/2013, 01/10/2015, 02/10/2015, 08/15/2015, 08/14/2017, 11/20/2019, 09/13/2021   MMR 05/14/1981   Moderna Sars-Covid-2 Vaccination 10/31/2020   PFIZER(Purple Top)SARS-COV-2 Vaccination 01/24/2020, 02/14/2020   Pneumococcal Polysaccharide-23 08/29/2014   Pneumococcal-Unspecified 05/14/2009, 08/14/2014   Td 12/14/2005   Tdap 01/14/2007, 02/12/2016, 03/02/2017   Zoster, Live 01/14/2015,  02/10/2015    TDAP status: Up to date  Flu Vaccine status: Up to date  Pneumococcal vaccine status: Up to date  Covid-19 vaccine status: Completed vaccines  Qualifies for Shingles Vaccine? Yes   Zostavax completed Yes   Shingrix Completed?: No.    Education has been provided regarding the importance of this vaccine. Patient has been advised to call insurance company to determine out of pocket expense if they have not yet received this vaccine. Advised may also receive vaccine at local pharmacy or Health Dept. Verbalized acceptance and understanding.  Screening Tests Health Maintenance  Topic Date Due   COVID-19 Vaccine (4 - Booster for Pfizer series) 01/30/2022 (Originally 12/26/2020)   Zoster Vaccines- Shingrix (1 of 2) 04/13/2022 (Originally 08/02/1999)   Pneumonia Vaccine 53+ Years old (2 - PCV) 01/14/2023 (Originally 08/30/2015)   OPHTHALMOLOGY EXAM  01/28/2022   FOOT EXAM  03/17/2022   HEMOGLOBIN A1C  03/20/2022   MAMMOGRAM  05/13/2022   COLONOSCOPY (Pts 45-23yrs Insurance coverage will need to be confirmed)  01/24/2023   TETANUS/TDAP  03/03/2027   INFLUENZA VACCINE  Completed   DEXA SCAN  Completed   Hepatitis C Screening  Completed   HPV VACCINES  Aged Out    Health Maintenance  There are no preventive care reminders to display for this patient.  Colorectal cancer screening: Type of screening: Colonoscopy. Completed 01/24/18. Repeat every 5 years  Mammogram status: Completed 05/13/21. Repeat every year  Bone Density status: Completed 01/17/20. Results reflect: Bone density results: NORMAL. Repeat every 2 years.  Lung Cancer Screening: (Low Dose CT Chest recommended if Age 62-80 years, 30 pack-year currently smoking OR have quit w/in 15years.) does not qualify.   Additional Screening:  Hepatitis C Screening: does qualify; Completed 03/02/16  Vision Screening: Recommended annual ophthalmology exams for early detection of glaucoma and other disorders of the eye. Is the  patient up to date with their annual eye exam?  Yes  Who is the provider or what is the name of the office in which the patient attends annual eye exams? [facility].   Dental Screening: Recommended annual dental exams for proper oral hygiene  Community Resource Referral / Chronic Care Management: CRR required this visit?  No   CCM required this visit?  No      Plan:     I have personally reviewed and noted the following in the patients chart:   Medical and social history Use of alcohol, tobacco or illicit drugs  Current medications and supplements including opioid prescriptions.  Functional ability and status Nutritional status Physical activity Advanced directives List of other physicians Hospitalizations, surgeries, and ER visits in previous 12 months Vitals Screenings to include cognitive, depression, and falls Referrals and appointments  In addition, I have reviewed and discussed with patient certain preventive protocols, quality metrics, and best practice recommendations. A written personalized care plan for preventive services as well as general preventive  health recommendations were provided to patient.     Clemetine Marker, LPN   01/16/3611   Nurse Notes: none

## 2022-01-21 ENCOUNTER — Ambulatory Visit: Payer: Medicare Other

## 2022-02-06 DIAGNOSIS — Z20822 Contact with and (suspected) exposure to covid-19: Secondary | ICD-10-CM | POA: Diagnosis not present

## 2022-03-11 DIAGNOSIS — H524 Presbyopia: Secondary | ICD-10-CM | POA: Diagnosis not present

## 2022-03-11 DIAGNOSIS — H2513 Age-related nuclear cataract, bilateral: Secondary | ICD-10-CM | POA: Diagnosis not present

## 2022-03-11 DIAGNOSIS — H21233 Degeneration of iris (pigmentary), bilateral: Secondary | ICD-10-CM | POA: Diagnosis not present

## 2022-03-11 DIAGNOSIS — E119 Type 2 diabetes mellitus without complications: Secondary | ICD-10-CM | POA: Diagnosis not present

## 2022-03-11 LAB — HM DIABETES EYE EXAM

## 2022-03-12 DIAGNOSIS — Z20822 Contact with and (suspected) exposure to covid-19: Secondary | ICD-10-CM | POA: Diagnosis not present

## 2022-03-24 DIAGNOSIS — E1142 Type 2 diabetes mellitus with diabetic polyneuropathy: Secondary | ICD-10-CM | POA: Diagnosis not present

## 2022-03-25 DIAGNOSIS — M1712 Unilateral primary osteoarthritis, left knee: Secondary | ICD-10-CM | POA: Diagnosis not present

## 2022-03-25 DIAGNOSIS — M1711 Unilateral primary osteoarthritis, right knee: Secondary | ICD-10-CM | POA: Diagnosis not present

## 2022-03-27 DIAGNOSIS — E1169 Type 2 diabetes mellitus with other specified complication: Secondary | ICD-10-CM | POA: Diagnosis not present

## 2022-03-27 DIAGNOSIS — E1159 Type 2 diabetes mellitus with other circulatory complications: Secondary | ICD-10-CM | POA: Diagnosis not present

## 2022-03-27 DIAGNOSIS — I152 Hypertension secondary to endocrine disorders: Secondary | ICD-10-CM | POA: Diagnosis not present

## 2022-03-27 DIAGNOSIS — E785 Hyperlipidemia, unspecified: Secondary | ICD-10-CM | POA: Diagnosis not present

## 2022-03-27 DIAGNOSIS — E1142 Type 2 diabetes mellitus with diabetic polyneuropathy: Secondary | ICD-10-CM | POA: Diagnosis not present

## 2022-03-27 LAB — HEMOGLOBIN A1C: Hemoglobin A1C: 7.2

## 2022-03-27 LAB — HM DIABETES FOOT EXAM: HM Diabetic Foot Exam: NORMAL

## 2022-03-28 DIAGNOSIS — Z20828 Contact with and (suspected) exposure to other viral communicable diseases: Secondary | ICD-10-CM | POA: Diagnosis not present

## 2022-04-10 DIAGNOSIS — Z20822 Contact with and (suspected) exposure to covid-19: Secondary | ICD-10-CM | POA: Diagnosis not present

## 2022-04-11 DIAGNOSIS — Z20822 Contact with and (suspected) exposure to covid-19: Secondary | ICD-10-CM | POA: Diagnosis not present

## 2022-04-21 DIAGNOSIS — M1711 Unilateral primary osteoarthritis, right knee: Secondary | ICD-10-CM | POA: Diagnosis not present

## 2022-04-27 ENCOUNTER — Other Ambulatory Visit: Payer: Self-pay | Admitting: Family Medicine

## 2022-04-27 ENCOUNTER — Encounter: Payer: Self-pay | Admitting: Family Medicine

## 2022-04-27 ENCOUNTER — Ambulatory Visit (INDEPENDENT_AMBULATORY_CARE_PROVIDER_SITE_OTHER): Payer: Medicare Other | Admitting: Family Medicine

## 2022-04-27 VITALS — BP 118/60 | HR 80 | Ht 65.0 in | Wt 158.0 lb

## 2022-04-27 DIAGNOSIS — R634 Abnormal weight loss: Secondary | ICD-10-CM

## 2022-04-27 DIAGNOSIS — J301 Allergic rhinitis due to pollen: Secondary | ICD-10-CM | POA: Diagnosis not present

## 2022-04-27 DIAGNOSIS — K219 Gastro-esophageal reflux disease without esophagitis: Secondary | ICD-10-CM

## 2022-04-27 DIAGNOSIS — I10 Essential (primary) hypertension: Secondary | ICD-10-CM

## 2022-04-27 DIAGNOSIS — R110 Nausea: Secondary | ICD-10-CM

## 2022-04-27 DIAGNOSIS — E785 Hyperlipidemia, unspecified: Secondary | ICD-10-CM

## 2022-04-27 DIAGNOSIS — G43109 Migraine with aura, not intractable, without status migrainosus: Secondary | ICD-10-CM | POA: Diagnosis not present

## 2022-04-27 MED ORDER — FLUTICASONE PROPIONATE 50 MCG/ACT NA SUSP: 1.0000 | Freq: Every day | NASAL | 1 refills | Status: DC

## 2022-04-27 MED ORDER — PANTOPRAZOLE SODIUM 40 MG PO TBEC: 40.0000 mg | DELAYED_RELEASE_TABLET | Freq: Two times a day (BID) | ORAL | 0 refills | Status: DC

## 2022-04-27 MED ORDER — ONDANSETRON HCL 4 MG PO TABS: 4.0000 mg | ORAL_TABLET | Freq: Three times a day (TID) | ORAL | 2 refills | Status: DC | PRN

## 2022-04-27 MED ORDER — MONTELUKAST SODIUM 10 MG PO TABS: 10.0000 mg | ORAL_TABLET | Freq: Every day | ORAL | 1 refills | Status: DC

## 2022-04-27 MED ORDER — HYDROCHLOROTHIAZIDE 25 MG PO TABS: 25.0000 mg | ORAL_TABLET | Freq: Every morning | ORAL | 1 refills | Status: DC

## 2022-04-27 MED ORDER — AMLODIPINE BESYLATE 5 MG PO TABS: 5.0000 mg | ORAL_TABLET | Freq: Every day | ORAL | 1 refills | Status: DC

## 2022-04-27 MED ORDER — ROSUVASTATIN CALCIUM 20 MG PO TABS: ORAL_TABLET | ORAL | 1 refills | Status: DC

## 2022-04-27 MED ORDER — LISINOPRIL 10 MG PO TABS: 10.0000 mg | ORAL_TABLET | Freq: Every day | ORAL | 1 refills | Status: DC

## 2022-04-27 MED ORDER — SUMATRIPTAN SUCCINATE 50 MG PO TABS: 50.0000 mg | ORAL_TABLET | ORAL | 1 refills | Status: DC | PRN

## 2022-04-27 NOTE — Progress Notes (Signed)
? ? ?Date:  04/27/2022  ? ?Name:  Nicole Cook   DOB:  Jul 31, 1951   MRN:  [id number] ? ? ?Chief Complaint: Hypertension, Hyperlipidemia, Gastroesophageal Reflux, Allergic Rhinitis , and Headache ? ?Hypertension ?This is a chronic problem. The current episode started more than 1 year ago. The problem has been gradually improving since onset. The problem is controlled. Associated symptoms include headaches, palpitations and shortness of breath. Pertinent negatives include no chest pain, orthopnea or PND. Risk factors for coronary artery disease include diabetes mellitus and dyslipidemia. Past treatments include beta blockers, calcium channel blockers, ACE inhibitors and diuretics. The current treatment provides moderate improvement. There are no compliance problems.  There is no history of angina, kidney disease, CAD/MI, CVA, heart failure, left ventricular hypertrophy, PVD or retinopathy. There is no history of chronic renal disease, a hypertension causing med or renovascular disease.  ?Hyperlipidemia ?This is a chronic problem. Exacerbating diseases include diabetes. She has no history of chronic renal disease. Associated symptoms include shortness of breath. Pertinent negatives include no chest pain or myalgias. Current antihyperlipidemic treatment includes statins. The current treatment provides moderate improvement of lipids. There are no compliance problems.  Risk factors for coronary artery disease include hypertension, diabetes mellitus and dyslipidemia.  ?Gastroesophageal Reflux ?She complains of nausea. She reports no chest pain, no heartburn or no wheezing. This is a new problem. The symptoms are aggravated by certain foods. Associated symptoms include fatigue.  ?Headache  ?This is a chronic (history of migraines) problem. The current episode started more than 1 year ago. The problem has been gradually improving. Associated symptoms include nausea. Pertinent negatives include no vomiting. Her past  medical history is significant for hypertension.  ? ?Lab Results  ?Component Value Date  ? NA 140 10/28/2021  ? K 4.6 10/28/2021  ? CO2 25 10/28/2021  ? GLUCOSE 151 (H) 10/28/2021  ? BUN 15 10/28/2021  ? CREATININE 0.82 10/28/2021  ? CALCIUM 9.5 10/28/2021  ? EGFR 76 10/28/2021  ? GFRNONAA 74 11/21/2020  ? ?Lab Results  ?Component Value Date  ? CHOL 145 10/28/2021  ? HDL 42 10/28/2021  ? [place] 73 10/28/2021  ? TRIG 174 (H) 10/28/2021  ? CHOLHDL 3.3 03/09/2018  ? ?No results found for: TSH ?Lab Results  ?Component Value Date  ? HGBA1C 7.2 03/27/2022  ? ?Lab Results  ?Component Value Date  ? WBC 5.0 10/10/2018  ? HGB 12.4 10/10/2018  ? HCT 37.2 10/10/2018  ? MCV 86 10/10/2018  ? PLT 185 10/10/2018  ? ?Lab Results  ?Component Value Date  ? ALT 26 10/28/2021  ? AST 22 10/28/2021  ? ALKPHOS 46 10/28/2021  ? BILITOT 0.3 10/28/2021  ? ?No results found for: 25OHVITD2, [place], VD25OH  ? ?Review of Systems  ?Constitutional:  Positive for fatigue and unexpected weight change.  ?Respiratory:  Positive for shortness of breath. Negative for wheezing.   ?Cardiovascular:  Positive for palpitations. Negative for chest pain, orthopnea and PND.  ?Gastrointestinal:  Positive for diarrhea and nausea. Negative for blood in stool, heartburn and vomiting.  ?Musculoskeletal:  Negative for myalgias.  ?Neurological:  Positive for headaches.  ? ?Patient Active Problem List  ? Diagnosis Date Noted  ? Age-related nuclear cataract of both eyes 03/09/2021  ? Diabetes mellitus type 2 without retinopathy ([place]) 03/09/2021  ? Pigmentary dispersion syndrome, bilateral 03/09/2021  ? Hyperlipidemia associated with type 2 diabetes mellitus ([place]) 12/25/2019  ? Atrophic vaginitis 11/23/2018  ? Pelvic floor relaxation 11/23/2018  ? Primary osteoarthritis of right knee  05/12/2018  ? Sacral back pain 08/04/2017  ? Taking medication for chronic disease 03/02/2017  ? Seasonal allergic rhinitis due to pollen 03/02/2017  ? Gastroesophageal reflux disease  03/02/2017  ? Migraine with aura and without status migrainosus, not intractable 03/02/2017  ? Angina pectoris ([place]) 11/09/2016  ? Chronic right-sided low back pain without sciatica 10/29/2016  ? Trochanteric bursitis of right hip 07/31/2015  ? Primary osteoarthritis of right hip 07/31/2015  ? Type 2 diabetes mellitus with hyperglycemia, with long-term current use of insulin ([place]) 04/03/2015  ? Hyperlipidemia 04/03/2015  ? Laboratory animal allergy 04/03/2015  ? Cephalalgia 04/03/2015  ? Essential hypertension 04/03/2015  ? Blood glucose elevated 04/03/2015  ? Routine general medical examination at a health care facility 04/03/2015  ? CAD (coronary artery disease) 04/03/2015  ? Dermatochalasis of both upper eyelids 09/20/2014  ? Sleep apnea 09/03/2014  ? DDD (degenerative disc disease), lumbosacral 03/03/2012  ? Spinal stenosis of lumbar region without neurogenic claudication 03/03/2012  ? Thoracic or lumbosacral neuritis or radiculitis, unspecified 03/03/2012  ? ? ?Allergies  ?Allergen Reactions  ? Aspartame Other (See Comments)  ? Compazine [Prochlorperazine]   ? Penicillins   ? Sulfa Antibiotics   ? Sulfamethoxazole-Trimethoprim Hives and Other (See Comments)  ?  Other Reaction: Not Assessed ?  ? ? ?Past Surgical History:  ?Procedure Laterality Date  ? back surgery x 2    ? c-section x 3    ? [place]  ? COLONOSCOPY  2011  ? cleared for 5 yrs- Dr Beckey Downing  ? EYE SURGERY    ? eyelid lesion  ? KNEE ARTHROSCOPY Right 2010  ? SPINE SURGERY  [phone]  ? TUBAL LIGATION  1982  ? VAGINAL HYSTERECTOMY  12/14/1994  ? ? ?Social History  ? ?Tobacco Use  ? Smoking status: Never  ? Smokeless tobacco: Never  ?Vaping Use  ? Vaping Use: Never used  ?Substance Use Topics  ? Alcohol use: No  ?  Alcohol/week: 0.0 standard drinks  ? Drug use: No  ? ? ? ?Medication list has been reviewed and updated. ? ?Current Meds  ?Medication Sig  ? ACCU-CHEK FASTCLIX LANCETS MISC Accu-Chek Fastclix Lancet Drum  ?  amLODipine (NORVASC) 5 MG tablet Take 1 tablet (5 mg total) by mouth daily.  ? aspirin 81 MG tablet Take 1 tablet by mouth daily.  ? cetirizine (ZYRTEC) 10 MG tablet Take 10 mg by mouth daily. otc  ? Cholecalciferol (VITAMIN D3) 1000 units CAPS Take 1 capsule by mouth daily.  ? Coenzyme Q10 300 MG CAPS Take 1 capsule by mouth daily.  ? Dulaglutide 1.5 MG/0.5ML SOPN Inject 1 each into the skin once a week. endo ?[facility].Sunday  ? estradiol (ESTRING) 2 MG vaginal ring Place 2 mg vaginally every 3 (three) months. follow package directions- GYN  ? fluticasone (FLONASE) 50 MCG/ACT nasal spray Place 1 spray into both nostrils daily.  ? gabapentin (NEURONTIN) 300 MG capsule Take 2 capsules by mouth 3 (three) times daily. [facility]- may take 3 capsules 3 times per day per pt  ? hydrochlorothiazide (HYDRODIURIL) 25 MG tablet Take 1 tablet (25 mg total) by mouth every morning.  ? HYDROcodone-acetaminophen (NORCO) 10-325 MG per tablet Take 1 tablet by mouth as needed. [facility] ?Gets 40 tabs monthly  ? insulin lispro (HUMALOG) 100 UNIT/ML KwikPen   ? lisinopril (ZESTRIL) 10 MG tablet Take 1 tablet (10 mg total) by mouth daily.  ? Magnesium 500 MG CAPS Take 1 capsule by mouth  daily.  ? metFORMIN (GLUCOPHAGE) 1000 MG tablet 1 tablet 2 (two) times daily.  ? metoprolol succinate (TOPROL-XL) 50 MG 24 hr tablet Take with or immediately following a meal.  ? montelukast (SINGULAIR) 10 MG tablet Take 1 tablet (10 mg total) by mouth at bedtime.  ? Multiple Vitamins-Minerals (CENTRUM SILVER ULTRA WOMENS PO) Take 1 tablet by mouth daily.  ? nitroGLYCERIN (NITROSTAT) 0.4 MG SL tablet Place 1 tablet under the tongue as needed.  ? olopatadine (PATANOL) 0.1 % ophthalmic solution Place 1 drop into both eyes 2 (two) times daily.  ? ondansetron (ZOFRAN) 4 MG tablet Take 1 tablet (4 mg total) by mouth every 8 (eight) hours as needed for nausea or vomiting.  ? ONE TOUCH ULTRA TEST test strip   ? OXYCONTIN 10 MG 12 hr tablet Take 10 mg by mouth 2 (two)  times daily. Pain Dr  ? pantoprazole (PROTONIX) 40 MG tablet Take 1 tablet (40 mg total) by mouth daily.  ? rosuvastatin (CRESTOR) 20 MG tablet TAKE 1 TABLET DAILY  ? SUMAtriptan (IMITREX) 50 MG tablet Take 1 tablet (5

## 2022-04-28 LAB — RENAL FUNCTION PANEL
Albumin: 4.6 g/dL (ref 3.7–4.7)
BUN/Creatinine Ratio: 22 (ref 12–28)
BUN: 18 mg/dL (ref 8–27)
CO2: 26 mmol/L (ref 20–29)
Calcium: 9.7 mg/dL (ref 8.7–10.3)
Chloride: 94 mmol/L — ABNORMAL LOW (ref 96–106)
Creatinine, Ser: 0.81 mg/dL (ref 0.57–1.00)
Glucose: 199 mg/dL — ABNORMAL HIGH (ref 70–99)
Phosphorus: 3.8 mg/dL (ref 3.0–4.3)
Potassium: 5.3 mmol/L — ABNORMAL HIGH (ref 3.5–5.2)
Sodium: 135 mmol/L (ref 134–144)
eGFR: 77 mL/min/{1.73_m2} (ref >59)

## 2022-05-13 ENCOUNTER — Ambulatory Visit: Payer: Medicare Other | Admitting: Family Medicine

## 2022-07-08 ENCOUNTER — Other Ambulatory Visit: Payer: Self-pay | Admitting: Family Medicine

## 2022-07-08 DIAGNOSIS — K219 Gastro-esophageal reflux disease without esophagitis: Secondary | ICD-10-CM

## 2022-07-17 IMAGING — MG MM DIGITAL DIAGNOSTIC UNILAT*L* W/ TOMO W/ CAD
4 series · 4 of 12 positions shown · non-contrast
Comparison: Prior films

CLINICAL DATA: Callback from screening mammogram for possible
architectural distortion left breast

EXAM:
DIGITAL DIAGNOSTIC UNILATERAL LEFT MAMMOGRAM WITH TOMOSYNTHESIS AND
CAD
TECHNIQUE: Left digital diagnostic mammography and breast tomosynthesis was
performed. The images were evaluated with computer-aided detection.

[L ML synth-2D]
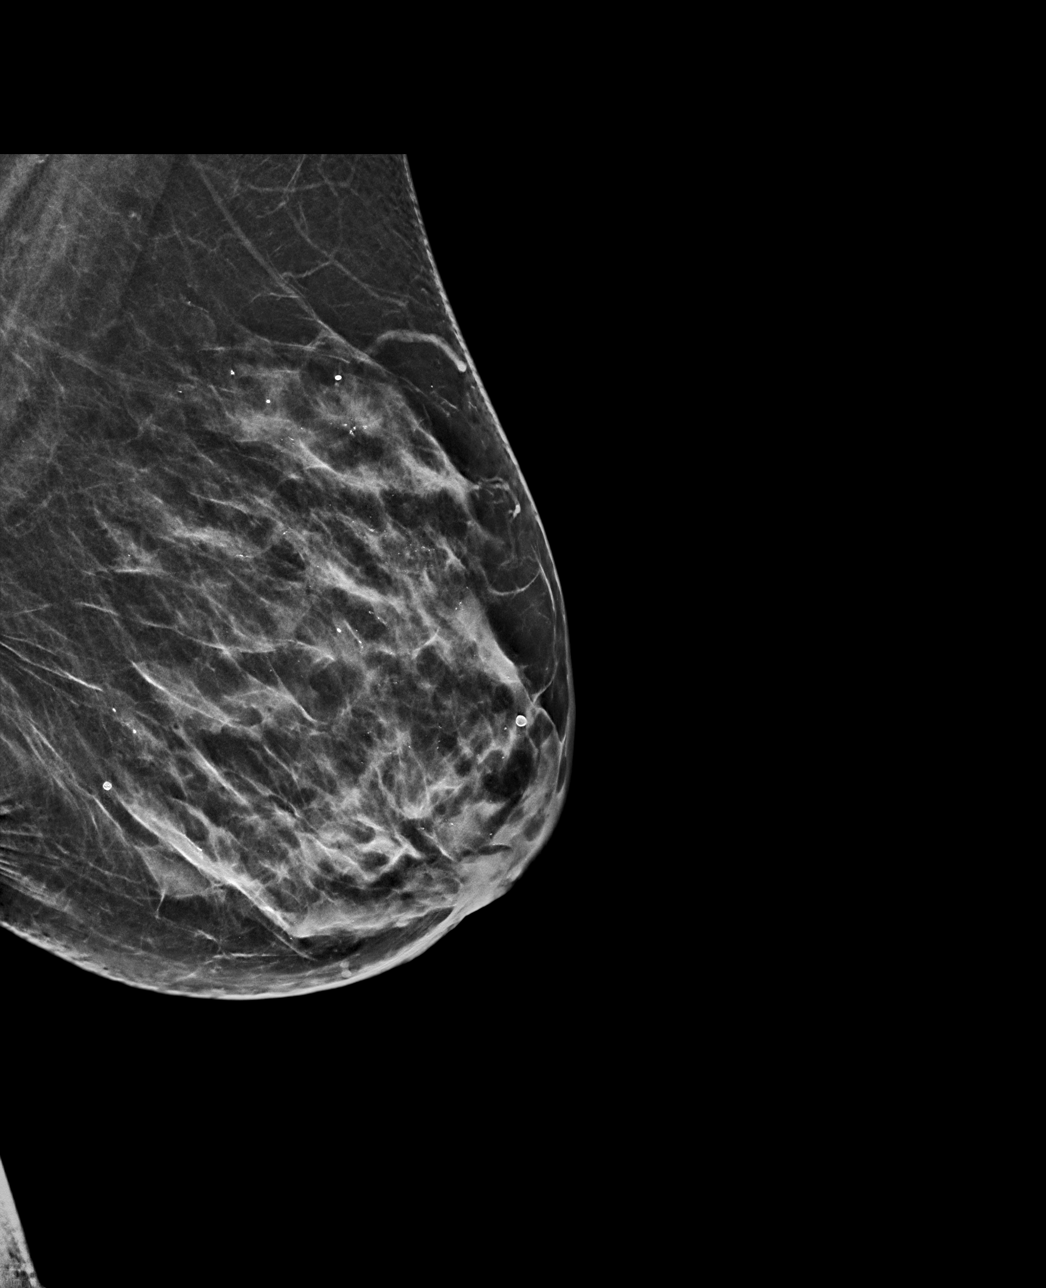

[L CC synth-2D]
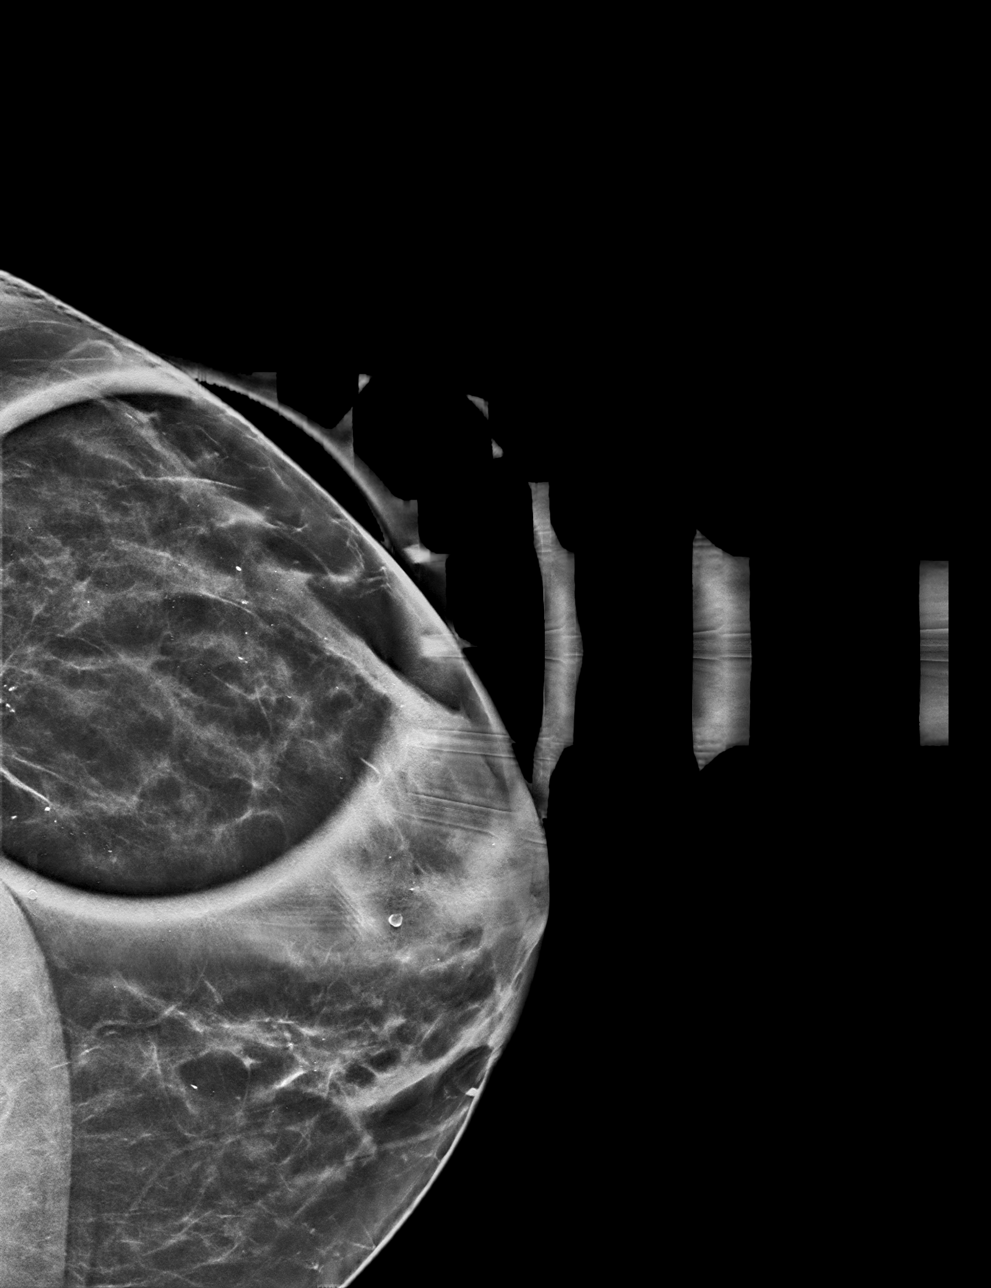

[L CC tomo · tomo slice 29/56.0]
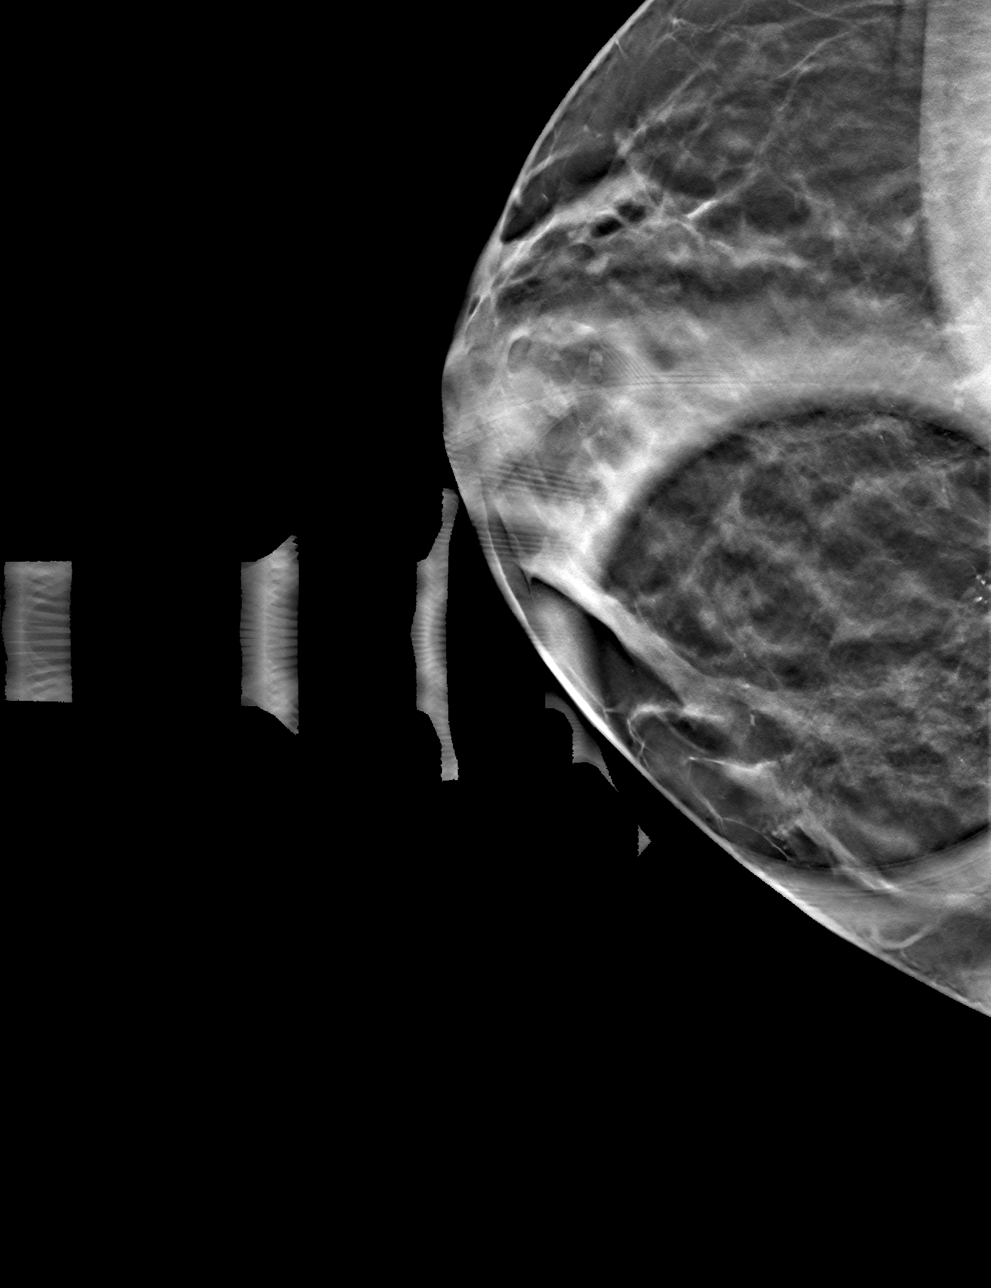

[L ML tomo · tomo slice 34/67.0]
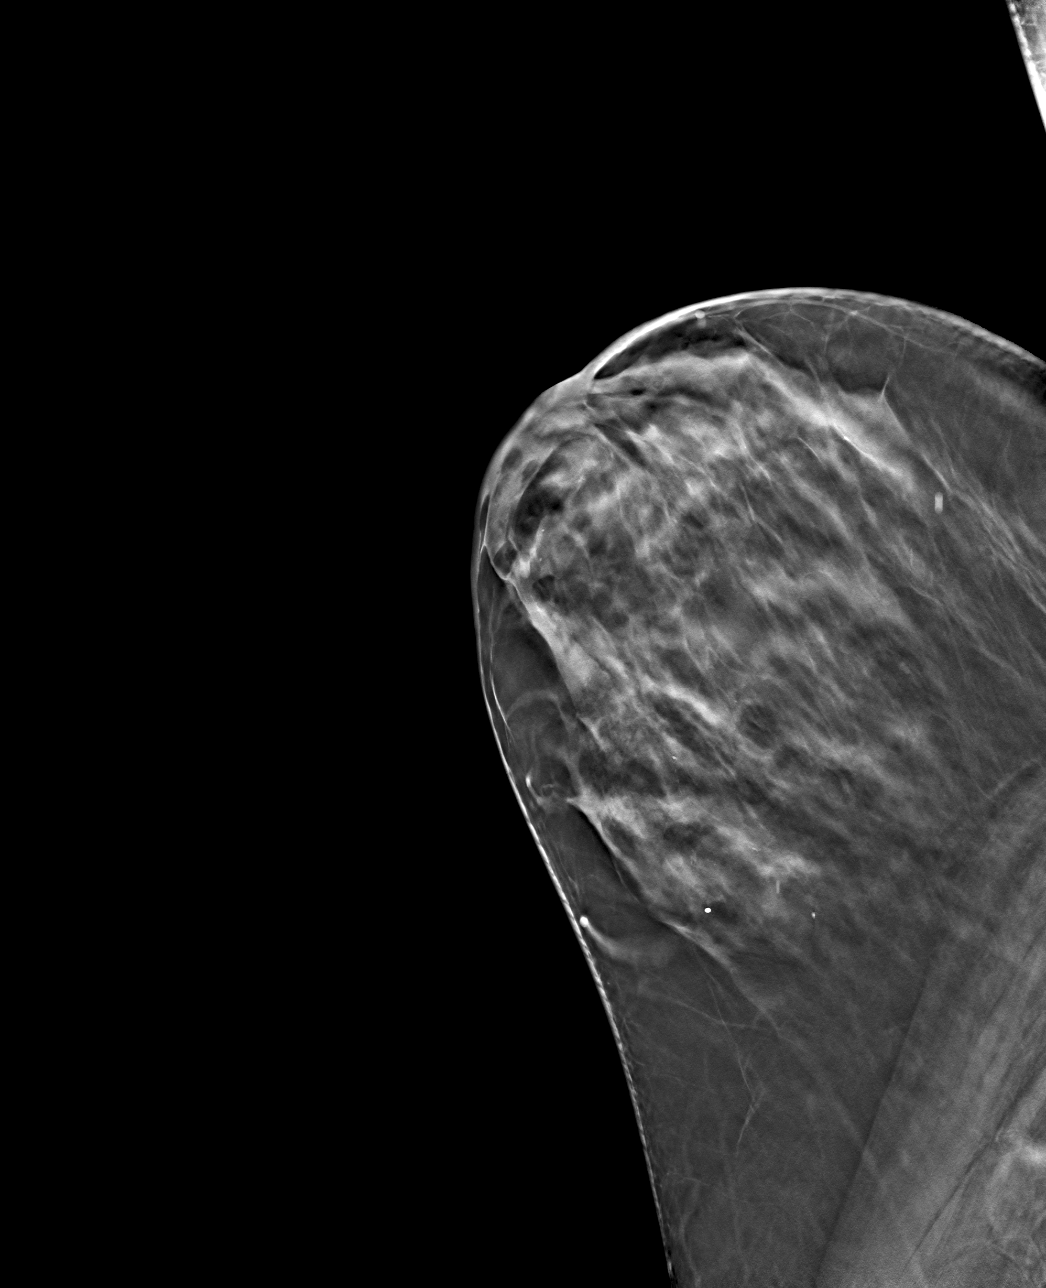

[4 of 12 positions shown; findings below may reference images not displayed]

ACR Breast Density Category c: The breast tissue is heterogeneously
dense, which may obscure small masses.
FINDINGS: Lateral view of left breast, spot compression left cc view are
submitted. Previously questioned distortion does not persist on
additional views. No suspicious abnormality is identified.
IMPRESSION: Benign findings.

RECOMMENDATION:
Teen screening mammogram back on schedule.

I have discussed the findings and recommendations with the patient.
If applicable, a reminder letter will be sent to the patient
regarding the next appointment.

BI-RADS CATEGORY  2: Benign.

## 2022-08-11 ENCOUNTER — Telehealth: Payer: Self-pay

## 2022-08-11 NOTE — Telephone Encounter (Signed)
Called and left message concerning the supplies request that we received. Last one we did is July of 2022. Pressure setting at 7. Does pt need this and does she need ALL the supplies they are requesting?

## 2022-09-08 DIAGNOSIS — N952 Postmenopausal atrophic vaginitis: Secondary | ICD-10-CM | POA: Diagnosis not present

## 2022-09-08 DIAGNOSIS — N819 Female genital prolapse, unspecified: Secondary | ICD-10-CM | POA: Diagnosis not present

## 2022-09-08 DIAGNOSIS — Z01419 Encounter for gynecological examination (general) (routine) without abnormal findings: Secondary | ICD-10-CM | POA: Diagnosis not present

## 2022-09-10 ENCOUNTER — Other Ambulatory Visit: Payer: Self-pay | Admitting: Family Medicine

## 2022-09-10 DIAGNOSIS — Z1231 Encounter for screening mammogram for malignant neoplasm of breast: Secondary | ICD-10-CM

## 2022-09-17 ENCOUNTER — Encounter: Payer: Self-pay | Admitting: Family Medicine

## 2022-09-21 ENCOUNTER — Encounter: Payer: Self-pay | Admitting: Family Medicine

## 2022-09-21 ENCOUNTER — Ambulatory Visit (INDEPENDENT_AMBULATORY_CARE_PROVIDER_SITE_OTHER): Payer: Medicare Other | Admitting: Family Medicine

## 2022-09-21 VITALS — BP 128/78 | HR 80 | Ht 65.0 in | Wt 165.0 lb

## 2022-09-21 DIAGNOSIS — R922 Inconclusive mammogram: Secondary | ICD-10-CM | POA: Diagnosis not present

## 2022-09-21 DIAGNOSIS — Z8481 Family history of carrier of genetic disease: Secondary | ICD-10-CM | POA: Diagnosis not present

## 2022-09-21 DIAGNOSIS — R9230 Dense breasts, unspecified: Secondary | ICD-10-CM

## 2022-09-21 DIAGNOSIS — Z23 Encounter for immunization: Secondary | ICD-10-CM | POA: Diagnosis not present

## 2022-09-21 NOTE — Progress Notes (Signed)
Date:  09/21/2022   Name:  Nicole Cook   DOB:  07-11-1951   MRN:  [id number]   Chief Complaint: breast density (Has family history of breast cancer sister and mother. Wants a referral to High risk breast cancer clinic- has upcoming mammo on Oct 18th) and Flu Vaccine  Patient is a 71 year old female who presents for a referral to high risk breast cancer/HER 2 neu positive hormone negative exam. The patient reports the following problems: dense breast tissue/ . Health maintenance has been reviewed up to date.      Lab Results  Component Value Date   NA 135 04/27/2022   K 5.3 (H) 04/27/2022   CO2 26 04/27/2022   GLUCOSE 199 (H) 04/27/2022   BUN 18 04/27/2022   CREATININE 0.81 04/27/2022   CALCIUM 9.7 04/27/2022   EGFR 77 04/27/2022   GFRNONAA 74 11/21/2020   Lab Results  Component Value Date   CHOL 145 10/28/2021   HDL 42 10/28/2021   LDLCALC 73 10/28/2021   TRIG 174 (H) 10/28/2021   CHOLHDL 3.3 03/09/2018   No results found for: "TSH" Lab Results  Component Value Date   HGBA1C 7.2 03/27/2022   Lab Results  Component Value Date   WBC 5.0 10/10/2018   HGB 12.4 10/10/2018   HCT 37.2 10/10/2018   MCV 86 10/10/2018   PLT 185 10/10/2018   Lab Results  Component Value Date   ALT 26 10/28/2021   AST 22 10/28/2021   ALKPHOS 46 10/28/2021   BILITOT 0.3 10/28/2021   No results found for: "25OHVITD2", "25OHVITD3", "VD25OH"   Review of Systems  Constitutional:  Negative for chills and fever.  HENT:  Negative for drooling, ear discharge, ear pain and sore throat.   Respiratory:  Negative for cough, shortness of breath and wheezing.   Cardiovascular:  Negative for chest pain, palpitations and leg swelling.  Gastrointestinal:  Negative for abdominal pain, blood in stool, constipation, diarrhea and nausea.  Endocrine: Negative for polydipsia.  Genitourinary:  Negative for dysuria, frequency, hematuria and urgency.  Musculoskeletal:  Negative for back pain, myalgias  and neck pain.  Skin:  Negative for rash.  Allergic/Immunologic: Negative for environmental allergies.  Neurological:  Negative for dizziness and headaches.  Hematological:  Does not bruise/bleed easily.  Psychiatric/Behavioral:  Negative for suicidal ideas. The patient is not nervous/anxious.     Patient Active Problem List   Diagnosis Date Noted   Age-related nuclear cataract of both eyes 03/09/2021   Diabetes mellitus type 2 without retinopathy ([facility]) 03/09/2021   Pigmentary dispersion syndrome, bilateral 03/09/2021   Hyperlipidemia associated with type 2 diabetes mellitus ([facility]) 12/25/2019   Atrophic vaginitis 11/23/2018   Pelvic floor relaxation 11/23/2018   Primary osteoarthritis of right knee 05/12/2018   Sacral back pain 08/04/2017   Taking medication for chronic disease 03/02/2017   Seasonal allergic rhinitis due to pollen 03/02/2017   Gastroesophageal reflux disease 03/02/2017   Migraine with aura and without status migrainosus, not intractable 03/02/2017   Angina pectoris ([facility]) 11/09/2016   Chronic right-sided low back pain without sciatica 10/29/2016   Trochanteric bursitis of right hip 07/31/2015   Primary osteoarthritis of right hip 07/31/2015   Type 2 diabetes mellitus with hyperglycemia, with long-term current use of insulin ([facility]) 04/03/2015   Hyperlipidemia 04/03/2015   Laboratory animal allergy 04/03/2015   Cephalalgia 04/03/2015   Essential hypertension 04/03/2015   Blood glucose elevated 04/03/2015   Routine general medical examination at a health care  facility 04/03/2015   CAD (coronary artery disease) 04/03/2015   Dermatochalasis of both upper eyelids 09/20/2014   Sleep apnea 09/03/2014   DDD (degenerative disc disease), lumbosacral 03/03/2012   Spinal stenosis of lumbar region without neurogenic claudication 03/03/2012   Thoracic or lumbosacral neuritis or radiculitis, unspecified 03/03/2012    Allergies  Allergen Reactions   Aspartame Other (See  Comments)   Compazine [Prochlorperazine]    Penicillins    Sulfa Antibiotics    Sulfamethoxazole-Trimethoprim Hives and Other (See Comments)    Other Reaction: Not Assessed     Past Surgical History:  Procedure Laterality Date   back surgery x 2     c-section x [place]  2011   cleared for 5 yrs- Dr Beckey Downing   EYE SURGERY     eyelid lesion   KNEE ARTHROSCOPY Right 2010   SPINE SURGERY  2003,2006   TUBAL LIGATION  1982   VAGINAL HYSTERECTOMY  12/14/1994    Social History   Tobacco Use   Smoking status: Never   Smokeless tobacco: Never  Vaping Use   Vaping Use: Never used  Substance Use Topics   Alcohol use: No    Alcohol/week: 0.0 standard drinks of alcohol   Drug use: No     Medication list has been reviewed and updated.  No outpatient medications have been marked as taking for the 09/21/22 encounter (Office Visit) with Juline Patch, MD.       09/21/2022    2:58 PM 04/27/2022    9:38 AM 01/14/2022    9:43 AM 10/28/2021    9:38 AM  GAD 7 : Generalized Anxiety Score  Nervous, Anxious, on Edge 0 0 0 0  Control/stop worrying 0 0 0 0  Worry too much - different things 0 0 0 0  Trouble relaxing 0 0 0 0  Restless 0 0 0 0  Easily annoyed or irritable 0 0 0 0  Afraid - awful might happen 0 0 0 0  Total GAD 7 Score 0 0 0 0  Anxiety Difficulty Not difficult at all Not difficult at all Not difficult at all Not difficult at all       09/21/2022    2:58 PM 04/27/2022    9:38 AM 01/14/2022    3:37 PM  Depression screen PHQ 2/9  Decreased Interest 0 0 0  Down, Depressed, Hopeless 0 0 0  PHQ - 2 Score 0 0 0  Altered sleeping 0 0   Tired, decreased energy 0 0   Change in appetite 0 0   Feeling bad or failure about yourself  0 0   Trouble concentrating 0 0   Moving slowly or fidgety/restless 0 0   Suicidal thoughts 0 0   PHQ-9 Score 0 0   Difficult doing work/chores Not difficult at all Not difficult at all     BP  Readings from Last 3 Encounters:  09/21/22 128/78  04/27/22 118/60  01/14/22 120/60    Physical Exam Vitals and nursing note reviewed. Exam conducted with a chaperone present.  Constitutional:      General: She is not in acute distress.    Appearance: She is not diaphoretic.  HENT:     Head: Normocephalic and atraumatic.     Right Ear: Tympanic membrane and external ear normal.     Left Ear: Tympanic membrane and external ear normal.     Nose: Nose normal.  Eyes:     General:        Right eye: No discharge.        Left eye: No discharge.     Conjunctiva/sclera: Conjunctivae normal.     Pupils: Pupils are equal, round, and reactive to light.  Neck:     Thyroid: No thyromegaly.     Vascular: No JVD.  Cardiovascular:     Rate and Rhythm: Normal rate and regular rhythm.     Pulses: Normal pulses.     Heart sounds: Normal heart sounds. No murmur heard.    No friction rub. No gallop.  Pulmonary:     Effort: Pulmonary effort is normal.     Breath sounds: Normal breath sounds. No wheezing or rhonchi.  Abdominal:     General: Bowel sounds are normal.     Palpations: Abdomen is soft. There is no mass.     Tenderness: There is no abdominal tenderness. There is no guarding or rebound.  Musculoskeletal:        General: Normal range of motion.     Cervical back: Normal range of motion and neck supple.  Lymphadenopathy:     Cervical: No cervical adenopathy.  Skin:    General: Skin is warm and dry.  Neurological:     Mental Status: She is alert.     Wt Readings from Last 3 Encounters:  09/21/22 165 lb (74.8 kg)  04/27/22 158 lb (71.7 kg)  01/14/22 169 lb (76.7 kg)    BP 128/78   Pulse 80   Ht $R'5\' 5"'uK$  (1.651 m)   Wt 165 lb (74.8 kg)   BMI 27.46 kg/m   Assessment and Plan: 1. Dense breasts Chronic.  Persistent.  Stable.  Patient does have a long history of dense breast which results in mammograms that are having to be further evaluated with ultrasound or mag views.   Patient also has noted as below that there is a strong family history of her particular breast cancer.  She would like very much to be evaluated by high risk breast cancer clinic for consideration of genetic testing and enhanced evaluation.  2. Family history of breast cancer gene mutation in first degree relative As noted above patient has had 2 family members her mother at age 6 and her sister in her 17s that had a particular cancer that was not hormone related.  This has resulted in mastectomies in them and that this is a concern of the patient that she would like to enhance evaluations in the future. - Ambulatory referral to Hematology / Oncology  3. Need for immunization against influenza Discussed and administered. - Flu Vaccine QUAD High Dose(Fluad)     Otilio Miu, MD

## 2022-09-28 DIAGNOSIS — E785 Hyperlipidemia, unspecified: Secondary | ICD-10-CM | POA: Diagnosis not present

## 2022-09-28 DIAGNOSIS — E1159 Type 2 diabetes mellitus with other circulatory complications: Secondary | ICD-10-CM | POA: Diagnosis not present

## 2022-09-28 DIAGNOSIS — E1169 Type 2 diabetes mellitus with other specified complication: Secondary | ICD-10-CM | POA: Diagnosis not present

## 2022-09-28 DIAGNOSIS — E1142 Type 2 diabetes mellitus with diabetic polyneuropathy: Secondary | ICD-10-CM | POA: Diagnosis not present

## 2022-09-28 DIAGNOSIS — I152 Hypertension secondary to endocrine disorders: Secondary | ICD-10-CM | POA: Diagnosis not present

## 2022-09-28 LAB — HEMOGLOBIN A1C: Hemoglobin A1C: 7.6

## 2022-09-30 ENCOUNTER — Ambulatory Visit
Admission: RE | Admit: 2022-09-30 | Discharge: 2022-09-30 | Disposition: A | Discharge status: Home / Self Care | Payer: Medicare Other | Source: Ambulatory Visit | Attending: Family Medicine | Admitting: Family Medicine

## 2022-09-30 DIAGNOSIS — Z85828 Personal history of other malignant neoplasm of skin: Secondary | ICD-10-CM | POA: Diagnosis not present

## 2022-09-30 DIAGNOSIS — L820 Inflamed seborrheic keratosis: Secondary | ICD-10-CM | POA: Diagnosis not present

## 2022-09-30 DIAGNOSIS — L812 Freckles: Secondary | ICD-10-CM | POA: Diagnosis not present

## 2022-09-30 DIAGNOSIS — L570 Actinic keratosis: Secondary | ICD-10-CM | POA: Diagnosis not present

## 2022-09-30 DIAGNOSIS — Z1231 Encounter for screening mammogram for malignant neoplasm of breast: Secondary | ICD-10-CM | POA: Diagnosis not present

## 2022-09-30 DIAGNOSIS — L821 Other seborrheic keratosis: Secondary | ICD-10-CM | POA: Diagnosis not present

## 2022-09-30 DIAGNOSIS — D225 Melanocytic nevi of trunk: Secondary | ICD-10-CM | POA: Diagnosis not present

## 2022-09-30 DIAGNOSIS — D2372 Other benign neoplasm of skin of left lower limb, including hip: Secondary | ICD-10-CM | POA: Diagnosis not present

## 2022-09-30 DIAGNOSIS — Z808 Family history of malignant neoplasm of other organs or systems: Secondary | ICD-10-CM | POA: Diagnosis not present

## 2022-10-05 ENCOUNTER — Telehealth: Payer: Self-pay | Admitting: Licensed Clinical Social Worker

## 2022-10-05 NOTE — Telephone Encounter (Signed)
Called patient to help with progeny log in/password information. Sent patient new invite.

## 2022-10-06 ENCOUNTER — Other Ambulatory Visit: Payer: Self-pay | Admitting: Family Medicine

## 2022-10-06 DIAGNOSIS — K219 Gastro-esophageal reflux disease without esophagitis: Secondary | ICD-10-CM

## 2022-10-06 NOTE — Telephone Encounter (Signed)
Requested Prescriptions  Pending Prescriptions Disp Refills  . pantoprazole (PROTONIX) 40 MG tablet [Pharmacy Med Name: PANTOPRAZOLE SODIUM DR TABS '40MG'$ ] 180 tablet 3    Sig: TAKE 1 TABLET TWICE A DAY     Gastroenterology: Proton Pump Inhibitors Passed - 10/06/2022 12:08 AM      Passed - Valid encounter within last 12 months    Recent Outpatient Visits          2 weeks ago Dense breasts   [facility] [place], Deanna C, MD   5 months ago Essential hypertension   [facility] [place], Deanna C, MD   8 months ago Urethritis   [facility], Deanna C, MD   11 months ago Essential hypertension   [facility] [place], Deanna C, MD   1 year ago Essential hypertension   [facility], [place], MD      Future Appointments            In 3 weeks Juline Patch, MD [facility] at [facility], [facility]

## 2022-10-13 ENCOUNTER — Inpatient Hospital Stay: Payer: Medicare Other

## 2022-10-13 ENCOUNTER — Encounter: Payer: Self-pay | Admitting: Licensed Clinical Social Worker

## 2022-10-13 ENCOUNTER — Inpatient Hospital Stay: Payer: Medicare Other | Attending: Oncology | Admitting: Licensed Clinical Social Worker

## 2022-10-13 DIAGNOSIS — Z1509 Genetic susceptibility to other malignant neoplasm: Secondary | ICD-10-CM | POA: Diagnosis not present

## 2022-10-13 DIAGNOSIS — Z8052 Family history of malignant neoplasm of bladder: Secondary | ICD-10-CM

## 2022-10-13 DIAGNOSIS — Z1501 Genetic susceptibility to malignant neoplasm of breast: Secondary | ICD-10-CM

## 2022-10-13 DIAGNOSIS — Z803 Family history of malignant neoplasm of breast: Secondary | ICD-10-CM | POA: Diagnosis not present

## 2022-10-13 DIAGNOSIS — Z800 Family history of malignant neoplasm of digestive organs: Secondary | ICD-10-CM | POA: Diagnosis not present

## 2022-10-13 DIAGNOSIS — Z808 Family history of malignant neoplasm of other organs or systems: Secondary | ICD-10-CM

## 2022-10-13 DIAGNOSIS — Z8042 Family history of malignant neoplasm of prostate: Secondary | ICD-10-CM

## 2022-10-13 DIAGNOSIS — Z85828 Personal history of other malignant neoplasm of skin: Secondary | ICD-10-CM | POA: Diagnosis not present

## 2022-10-13 NOTE — Progress Notes (Signed)
REFERRING PROVIDER: Juline Patch, MD [address] [place],  [place] [address]  PRIMARY PROVIDER:  Juline Patch, MD  PRIMARY REASON FOR VISIT:  1. Family history of breast cancer   2. Family history of colon cancer   3. Family history of prostate cancer   4. Family history of pancreatic cancer   5. Family history of bladder cancer   6. Family history of melanoma      HISTORY OF PRESENT ILLNESS:   Ms. Roark, a 71 y.o. female, was seen for a [place] cancer genetics consultation at the request of Dr. Ronnald Ramp due to a family history of cancer.  Ms. Foland presents to clinic today to discuss the possibility of a hereditary predisposition to cancer, genetic testing, and to further clarify her future cancer risks, as well as potential cancer risks for family members.    CANCER HISTORY:  Ms. Stang is a 71 y.o. female with no personal history of cancer.    Past Medical History:  Diagnosis Date   Age-related nuclear cataract of both eyes 03/09/2021   Allergy    Arthritis    Cancer ([place])    basal cell skin ca   Diabetes mellitus without complication (HCC)    GERD (gastroesophageal reflux disease)    Hyperlipidemia    Hypertension    Migraine    Myocardial infarction [facility]) May, 2011   Oxygen deficiency    CPAP   Pelvic floor relaxation    Sleep apnea    Thoracic or lumbosacral neuritis or radiculitis, unspecified     Past Surgical History:  Procedure Laterality Date   back surgery x 2     c-section x [place]   COLONOSCOPY  2011   cleared for 5 yrs- Dr Beckey Downing   EYE SURGERY     eyelid lesion   KNEE ARTHROSCOPY Right 2010   SPINE SURGERY  2003,2006   TUBAL LIGATION  1982   VAGINAL HYSTERECTOMY  12/14/1994    FAMILY HISTORY:  We obtained a detailed, 4-generation family history.  Significant diagnoses are listed below: Family History  Problem Relation Age of Onset   Breast cancer Mother 54   Arthritis Mother     Melanoma Mother    COPD Mother    Diabetes Mother    Hearing loss Mother    Heart disease Mother    Hypertension Mother    Stroke Mother    Parkinson's disease Father    Diabetes Father    Heart disease Father    Colon cancer Father 63   Prostate cancer Father        dx late 66s   Breast cancer Sister 37   Alcohol abuse Brother    Diabetes Brother    Diabetes Brother    Vaginal cancer Maternal Aunt        dx 85s   Bladder Cancer Maternal Uncle        dx 79s   Breast cancer Paternal Aunt        dx 79s   Stroke Maternal Grandfather    Cancer Paternal Grandmother    Pancreatic cancer Paternal Grandmother    Ms. Dern has 2 sons, 1 daughter. She has 3 brothers, 1 sister. Her sister had breast cancer at 60/61 and had negative BRCA testing. Her cancer was HER2+.   Ms. Ruda mother also had HER2+ breast cancer, diagnosed at 76. She also had melanoma before the breast cancer.  Maternal uncle had bladder cancer in his 34s. Maternal aunt had vaginal cancer in her 106s that adhered to her intestinal wall.   Ms. Handrich father had colon cancer at 49 and prostate cancer in his late 92s. Paternal aunt had breast cancer in her 35s. Paternal grandmother had pancreatic cancer in her mid 66s.   Ms. Kehm is aware of previous family history of genetic testing for hereditary cancer risks. There is no reported Ashkenazi Jewish ancestry. There is no known consanguinity.    GENETIC COUNSELING ASSESSMENT: Ms. Eisel is a 71 y.o. female with a family history of breast, prostate and pancreatic cancer which is somewhat suggestive of a hereditary cancer syndrome and predisposition to cancer. We, therefore, discussed and recommended the following at today's visit.   DISCUSSION: We discussed that approximately 10% of cancer is hereditary. Most cases of hereditary breast/prostate/pancreatic cancer are associated with BRCA1/BRCA2 genes, although there are other genes associated with hereditary cancer  as well. Cancers and risks are gene specific. We discussed that testing is beneficial for several reasons including knowing about cancer risks, identifying potential screening and risk-reduction options that may be appropriate, and to understand if other family members could be at risk for cancer and allow them to undergo genetic testing.   We reviewed the characteristics, features and inheritance patterns of hereditary cancer syndromes. We also discussed genetic testing, including the appropriate family members to test, the process of testing, insurance coverage and turn-around-time for results. We discussed the implications of a negative, positive and/or variant of uncertain significant result. We recommended Ms. Zubiate pursue genetic testing for the [facility] Common Hereditary Cancers+RNA gene panel.   Based on Ms. Diniz's family history of cancer, she meets medical criteria for genetic testing. Despite that she meets criteria, she may still have an out of pocket cost. We discussed that if her out of pocket cost for testing is over $100, the laboratory will call and confirm whether she wants to proceed with testing.  If the out of pocket cost of testing is less than $100 she will be billed by the genetic testing laboratory.   PLAN: After considering the risks, benefits, and limitations, Ms. Bryand provided informed consent to pursue genetic testing and the blood sample was sent to [facility] for analysis of the Common Hereditary Cancers+RNA panel. Results should be available within approximately 2-3 weeks' time, at which point they will be disclosed by telephone to Ms. Avena, as will any additional recommendations warranted by these results. Ms. Noori will receive a summary of her genetic counseling visit and a copy of her results once available. This information will also be available in Epic.   Ms. Saggese questions were answered to her satisfaction today. Our contact information was  provided should additional questions or concerns arise. Thank you for the referral and allowing [place] to share in the care of your patient.   Faith Rogue, MS, [facility] Genetic Counselor [email or web address] Phone: [phone]  The patient was seen for a total of 25 minutes in face-to-face genetic counseling.  Dr. Grayland Ormond was available for discussion regarding this case.   _______________________________________________________________________ For Office Staff:  Number of people involved in session: 1 Was an Intern/ student involved with case: no

## 2022-10-26 ENCOUNTER — Other Ambulatory Visit: Payer: Self-pay | Admitting: Family Medicine

## 2022-10-26 DIAGNOSIS — E785 Hyperlipidemia, unspecified: Secondary | ICD-10-CM

## 2022-10-26 DIAGNOSIS — I10 Essential (primary) hypertension: Secondary | ICD-10-CM

## 2022-10-26 DIAGNOSIS — J301 Allergic rhinitis due to pollen: Secondary | ICD-10-CM

## 2022-10-26 NOTE — Telephone Encounter (Signed)
Requested Prescriptions  Pending Prescriptions Disp Refills   rosuvastatin (CRESTOR) 20 MG tablet [Pharmacy Med Name: ROSUVASTATIN TABS '20MG'$ ] 90 tablet 0    Sig: TAKE 1 TABLET DAILY     Cardiovascular:  Antilipid - Statins 2 Failed - 10/26/2022 12:30 AM      Failed - Lipid Panel in normal range within the last 12 months    Cholesterol, Total  Date Value Ref Range Status  10/28/2021 145 100 - 199 mg/dL Final   LDL Chol Calc (NIH)  Date Value Ref Range Status  10/28/2021 73 0 - 99 mg/dL Final   HDL  Date Value Ref Range Status  10/28/2021 42 >39 mg/dL Final   Triglycerides  Date Value Ref Range Status  10/28/2021 174 (H) 0 - 149 mg/dL Final         Passed - Cr in normal range and within 360 days    Creatinine, Ser  Date Value Ref Range Status  04/27/2022 0.81 0.57 - 1.00 mg/dL Final         Passed - Patient is not pregnant      Passed - Valid encounter within last 12 months    Recent Outpatient Visits           1 month ago Dense breasts   [place] [facility] [place], [place], MD   6 months ago Essential hypertension   [place] [facility] [place], Deanna C, MD   9 months ago Urethritis   [place] [facility] [place], Deanna C, MD   12 months ago Essential hypertension   [place] [facility] [place], Deanna C, MD   1 year ago Essential hypertension   [place] [facility] [place], [place], MD       Future Appointments             In 2 days Juline Patch, MD [facility] at [place], [facility]             montelukast (SINGULAIR) 10 MG tablet [Pharmacy Med Name: MONTELUKAST SODIUM TABS '10MG'$ ] 90 tablet 0    Sig: TAKE 1 TABLET AT BEDTIME     Pulmonology:  Leukotriene Inhibitors Passed - 10/26/2022 12:30 AM       Passed - Valid encounter within last 12 months    Recent Outpatient Visits           1 month ago Dense breasts   [place] [facility] [place], Deanna C, MD   6 months ago Essential hypertension   [place] [facility] [place], Deanna C, MD   9 months ago Urethritis   [place] [facility] [place], Deanna C, MD   12 months ago Essential hypertension   [place] [facility] [place], Deanna C, MD   1 year ago Essential hypertension   [place] [facility] [place], [place], MD       Future Appointments             In 2 days Juline Patch, MD [facility] at [facility], [facility]  hydrochlorothiazide (HYDRODIURIL) 25 MG tablet [Pharmacy Med Name: HYDROCHLOROTHIAZIDE TABS '25MG'$ ] 90 tablet 0    Sig: TAKE 1 TABLET EVERY MORNING     Cardiovascular: Diuretics - Thiazide Failed - 10/26/2022 12:30 AM      Failed - Cr in normal range and within 180 days    Creatinine, Ser  Date Value Ref Range Status  04/27/2022 0.81 0.57 - 1.00 mg/dL Final         Failed - K in normal range and within 180 days    Potassium  Date Value Ref Range Status  04/27/2022 5.3 (H) 3.5 - 5.2 mmol/L Final         Failed - Na in normal range and within 180 days    Sodium  Date Value Ref Range Status  04/27/2022 135 134 - 144 mmol/L Final         Passed - Last BP in normal range    BP Readings from Last 1 Encounters:  09/21/22 128/78         Passed - Valid encounter within last 6 months    Recent Outpatient Visits           1 month ago Dense breasts   [place] [facility] [place], [place], MD   6 months ago Essential hypertension   [place] [facility] [place], [place], MD   9 months ago Urethritis   [place] [facility] at [place], Deanna C, MD   12 months ago Essential hypertension   [place] [facility] at [place], Deanna C, MD   1 year ago Essential hypertension   [place] [facility] at [place], [place], MD       Future Appointments             In 2 days Juline Patch, MD [facility] at [place], [facility]             amLODipine (NORVASC) 5 MG tablet [Pharmacy Med Name: AMLODIPINE BESYLATE TABS '5MG'$ ] 90 tablet 0    Sig: TAKE 1 TABLET DAILY     Cardiovascular: Calcium Channel Blockers 2 Passed - 10/26/2022 12:30 AM      Passed - Last BP in normal range    BP Readings from Last 1 Encounters:  09/21/22 128/78         Passed - Last Heart Rate in normal range    Pulse Readings from Last 1 Encounters:  09/21/22 80         Passed - Valid encounter within last 6 months    Recent Outpatient Visits           1 month ago Dense breasts   [place] [facility] [place], Deanna C, MD   6 months ago Essential hypertension   [place] [facility], MD   9 months ago Urethritis   [place] [facility] at [place], Deanna C, MD   12 months ago Essential hypertension   [place] [facility] at [place], Deanna C, MD   1 year ago Essential hypertension   [place] [facility] at [facility], MD  Future Appointments             In 2 days Juline Patch, MD [facility] Primary Care and [facility] Medicine at [facility], [facility]

## 2022-10-28 ENCOUNTER — Ambulatory Visit (INDEPENDENT_AMBULATORY_CARE_PROVIDER_SITE_OTHER): Payer: Medicare Other | Admitting: Family Medicine

## 2022-10-28 ENCOUNTER — Encounter: Payer: Self-pay | Admitting: Family Medicine

## 2022-10-28 VITALS — BP 122/70 | HR 72 | Ht 65.0 in | Wt 160.0 lb

## 2022-10-28 DIAGNOSIS — Z23 Encounter for immunization: Secondary | ICD-10-CM | POA: Diagnosis not present

## 2022-10-28 DIAGNOSIS — E785 Hyperlipidemia, unspecified: Secondary | ICD-10-CM

## 2022-10-28 DIAGNOSIS — G43109 Migraine with aura, not intractable, without status migrainosus: Secondary | ICD-10-CM

## 2022-10-28 DIAGNOSIS — J301 Allergic rhinitis due to pollen: Secondary | ICD-10-CM | POA: Diagnosis not present

## 2022-10-28 DIAGNOSIS — E119 Type 2 diabetes mellitus without complications: Secondary | ICD-10-CM | POA: Diagnosis not present

## 2022-10-28 DIAGNOSIS — I10 Essential (primary) hypertension: Secondary | ICD-10-CM

## 2022-10-28 DIAGNOSIS — K219 Gastro-esophageal reflux disease without esophagitis: Secondary | ICD-10-CM

## 2022-10-28 MED ORDER — LISINOPRIL 10 MG PO TABS: 10.0000 mg | ORAL_TABLET | Freq: Every day | ORAL | 1 refills | Status: DC

## 2022-10-28 MED ORDER — AMLODIPINE BESYLATE 5 MG PO TABS: 5.0000 mg | ORAL_TABLET | Freq: Every day | ORAL | 0 refills | Status: DC

## 2022-10-28 MED ORDER — SUMATRIPTAN SUCCINATE 50 MG PO TABS: 50.0000 mg | ORAL_TABLET | ORAL | 1 refills | Status: DC | PRN

## 2022-10-28 MED ORDER — METOPROLOL SUCCINATE ER 50 MG PO TB24: 75.0000 mg | ORAL_TABLET | Freq: Every day | ORAL | 1 refills | Status: DC

## 2022-10-28 MED ORDER — MONTELUKAST SODIUM 10 MG PO TABS: 10.0000 mg | ORAL_TABLET | Freq: Every day | ORAL | 0 refills | Status: DC

## 2022-10-28 MED ORDER — PANTOPRAZOLE SODIUM 40 MG PO TBEC: 40.0000 mg | DELAYED_RELEASE_TABLET | Freq: Two times a day (BID) | ORAL | 1 refills | Status: DC

## 2022-10-28 MED ORDER — FLUTICASONE PROPIONATE 50 MCG/ACT NA SUSP: 1.0000 | Freq: Every day | NASAL | 1 refills | Status: DC

## 2022-10-28 MED ORDER — ROSUVASTATIN CALCIUM 20 MG PO TABS: 20.0000 mg | ORAL_TABLET | Freq: Every day | ORAL | 1 refills | Status: DC

## 2022-10-28 MED ORDER — HYDROCHLOROTHIAZIDE 25 MG PO TABS: 25.0000 mg | ORAL_TABLET | Freq: Every morning | ORAL | 0 refills | Status: DC

## 2022-10-28 NOTE — Progress Notes (Signed)
Date:  10/28/2022   Name:  Nicole Cook   DOB:  December 06, 1951   MRN:  [id number]   Chief Complaint: Hypertension, Allergic Rhinitis , Gastroesophageal Reflux, Hyperlipidemia, and Headache  Hypertension This is a chronic problem. The current episode started more than 1 year ago. The problem has been gradually improving since onset. The problem is controlled. Associated symptoms include headaches. Pertinent negatives include no chest pain, neck pain, palpitations or shortness of breath. Past treatments include ACE inhibitors, diuretics, beta blockers and calcium channel blockers. The current treatment provides moderate improvement.  Gastroesophageal Reflux She reports no abdominal pain, no chest pain, no coughing, no dysphagia, no heartburn, no nausea, no sore throat or no wheezing. This is a chronic problem. The problem occurs rarely. The symptoms are aggravated by certain foods. She has tried a PPI for the symptoms. The treatment provided moderate relief.  Hyperlipidemia This is a chronic problem. The current episode started more than 1 year ago. The problem is controlled. Recent lipid tests were reviewed and are normal. Factors aggravating her hyperlipidemia include thiazides. Pertinent negatives include no chest pain, myalgias or shortness of breath. Current antihyperlipidemic treatment includes statins. The current treatment provides moderate improvement of lipids. There are no compliance problems.   Headache  This is a chronic problem. The problem has been gradually improving. Pertinent negatives include no abdominal pain, back pain, coughing, dizziness, ear pain, fever, nausea, neck pain or sore throat. Her past medical history is significant for hypertension.    Lab Results  Component Value Date   NA 135 04/27/2022   K 5.3 (H) 04/27/2022   CO2 26 04/27/2022   GLUCOSE 199 (H) 04/27/2022   BUN 18 04/27/2022   CREATININE 0.81 04/27/2022   CALCIUM 9.7 04/27/2022   EGFR 77 04/27/2022    GFRNONAA 74 11/21/2020   Lab Results  Component Value Date   CHOL 145 10/28/2021   HDL 42 10/28/2021   LDLCALC 73 10/28/2021   TRIG 174 (H) 10/28/2021   CHOLHDL 3.3 03/09/2018   No results found for: "TSH" Lab Results  Component Value Date   HGBA1C 7.6 09/28/2022   Lab Results  Component Value Date   WBC 5.0 10/10/2018   HGB 12.4 10/10/2018   HCT 37.2 10/10/2018   MCV 86 10/10/2018   PLT 185 10/10/2018   Lab Results  Component Value Date   ALT 26 10/28/2021   AST 22 10/28/2021   ALKPHOS 46 10/28/2021   BILITOT 0.3 10/28/2021   No results found for: "25OHVITD2", "25OHVITD3", "VD25OH"   Review of Systems  Constitutional:  Negative for chills and fever.  HENT:  Negative for drooling, ear discharge, ear pain and sore throat.   Respiratory:  Negative for cough, chest tightness, shortness of breath and wheezing.   Cardiovascular:  Negative for chest pain, palpitations and leg swelling.  Gastrointestinal:  Negative for abdominal pain, blood in stool, constipation, diarrhea, dysphagia, heartburn and nausea.  Endocrine: Negative for polydipsia and polyuria.  Genitourinary:  Negative for dysuria, frequency, hematuria and urgency.  Musculoskeletal:  Negative for back pain, myalgias and neck pain.  Skin:  Negative for rash.  Allergic/Immunologic: Negative for environmental allergies.  Neurological:  Positive for headaches. Negative for dizziness.  Hematological:  Does not bruise/bleed easily.  Psychiatric/Behavioral:  Negative for suicidal ideas. The patient is not nervous/anxious.     Patient Active Problem List   Diagnosis Date Noted   Age-related nuclear cataract of both eyes 03/09/2021   Diabetes mellitus type 2  without retinopathy ([place]) 03/09/2021   Pigmentary dispersion syndrome, bilateral 03/09/2021   Hyperlipidemia associated with type 2 diabetes mellitus ([place]) 12/25/2019   Atrophic vaginitis 11/23/2018   Pelvic floor relaxation 11/23/2018   Primary  osteoarthritis of right knee 05/12/2018   Sacral back pain 08/04/2017   Taking medication for chronic disease 03/02/2017   Seasonal allergic rhinitis due to pollen 03/02/2017   Gastroesophageal reflux disease 03/02/2017   Migraine with aura and without status migrainosus, not intractable 03/02/2017   Angina pectoris ([place]) 11/09/2016   Chronic right-sided low back pain without sciatica 10/29/2016   Trochanteric bursitis of right hip 07/31/2015   Primary osteoarthritis of right hip 07/31/2015   Type 2 diabetes mellitus with hyperglycemia, with long-term current use of insulin ([place]) 04/03/2015   Hyperlipidemia 04/03/2015   Laboratory animal allergy 04/03/2015   Cephalalgia 04/03/2015   Essential hypertension 04/03/2015   Blood glucose elevated 04/03/2015   Routine general medical examination at a health care facility 04/03/2015   CAD (coronary artery disease) 04/03/2015   Dermatochalasis of both upper eyelids 09/20/2014   Sleep apnea 09/03/2014   DDD (degenerative disc disease), lumbosacral 03/03/2012   Spinal stenosis of lumbar region without neurogenic claudication 03/03/2012   Thoracic or lumbosacral neuritis or radiculitis, unspecified 03/03/2012    Allergies  Allergen Reactions   Aspartame Other (See Comments)   Compazine [Prochlorperazine]    Penicillins    Sulfa Antibiotics    Sulfamethoxazole-Trimethoprim Hives and Other (See Comments)    Other Reaction: Not Assessed     Past Surgical History:  Procedure Laterality Date   back surgery x 2     c-section x [place]  2011   cleared for 5 yrs- Dr Beckey Downing   EYE SURGERY     eyelid lesion   KNEE ARTHROSCOPY Right 2010   SPINE SURGERY  2003,2006   TUBAL LIGATION  1982   VAGINAL HYSTERECTOMY  12/14/1994    Social History   Tobacco Use   Smoking status: Never   Smokeless tobacco: Never  Vaping Use   Vaping Use: Never used  Substance Use Topics   Alcohol use: No     Alcohol/week: 0.0 standard drinks of alcohol   Drug use: No     Medication list has been reviewed and updated.  Current Meds  Medication Sig   ACCU-CHEK FASTCLIX LANCETS MISC Accu-Chek Fastclix Lancet Drum   amLODipine (NORVASC) 5 MG tablet TAKE 1 TABLET DAILY   aspirin 81 MG tablet Take 1 tablet by mouth daily.   cetirizine (ZYRTEC) 10 MG tablet Take 10 mg by mouth daily. otc   Cholecalciferol (VITAMIN D3) 1000 units CAPS Take 1 capsule by mouth daily.   Coenzyme Q10 300 MG CAPS Take 1 capsule by mouth daily.   Dulaglutide 1.5 MG/0.5ML SOPN Inject 1 each into the skin once a week. endo .Marland KitchenSunday   estradiol (ESTRING) 2 MG vaginal ring Place 2 mg vaginally every 3 (three) months. follow package directions- GYN   fluticasone (FLONASE) 50 MCG/ACT nasal spray Place 1 spray into both nostrils daily.   gabapentin (NEURONTIN) 300 MG capsule Take 2 capsules by mouth 3 (three) times daily. [facility]- may take 3 capsules 3 times per day per pt   hydrochlorothiazide (HYDRODIURIL) 25 MG tablet TAKE 1 TABLET EVERY MORNING   HYDROcodone-acetaminophen (NORCO) 10-325 MG per tablet Take 1 tablet by mouth as needed. [facility] Gets 40 tabs monthly   insulin lispro (  HUMALOG) 100 UNIT/ML KwikPen    lisinopril (ZESTRIL) 10 MG tablet Take 1 tablet (10 mg total) by mouth daily.   Magnesium 500 MG CAPS Take 1 capsule by mouth daily.   metFORMIN (GLUCOPHAGE) 1000 MG tablet 1 tablet 2 (two) times daily.   metoprolol succinate (TOPROL-XL) 50 MG 24 hr tablet Take with or immediately following a meal. (Patient taking differently: 75 mg daily. Take 1 and 1/2 tabs daily)   montelukast (SINGULAIR) 10 MG tablet TAKE 1 TABLET AT BEDTIME   Multiple Vitamins-Minerals (CENTRUM SILVER ULTRA WOMENS PO) Take 1 tablet by mouth daily.   nitroGLYCERIN (NITROSTAT) 0.4 MG SL tablet Place 1 tablet under the tongue as needed.   olopatadine (PATANOL) 0.1 % ophthalmic solution Place 1 drop into both eyes 2 (two) times daily.    ondansetron (ZOFRAN) 4 MG tablet Take 1 tablet (4 mg total) by mouth every 8 (eight) hours as needed for nausea or vomiting.   ONE TOUCH ULTRA TEST test strip    OXYCONTIN 10 MG 12 hr tablet Take 10 mg by mouth 2 (two) times daily. Pain Dr   pantoprazole (PROTONIX) 40 MG tablet TAKE 1 TABLET TWICE A DAY   rosuvastatin (CRESTOR) 20 MG tablet TAKE 1 TABLET DAILY   SUMAtriptan (IMITREX) 50 MG tablet Take 1 tablet (50 mg total) by mouth as needed. Per 90 days   vitamin B-12 (CYANOCOBALAMIN) 1000 MCG tablet Take 1,000 mcg by mouth daily.       10/28/2022    9:57 AM 09/21/2022    2:58 PM 04/27/2022    9:38 AM 01/14/2022    9:43 AM  GAD 7 : Generalized Anxiety Score  Nervous, Anxious, on Edge 0 0 0 0  Control/stop worrying 0 0 0 0  Worry too much - different things 0 0 0 0  Trouble relaxing 0 0 0 0  Restless 0 0 0 0  Easily annoyed or irritable 0 0 0 0  Afraid - awful might happen 0 0 0 0  Total GAD 7 Score 0 0 0 0  Anxiety Difficulty Not difficult at all Not difficult at all Not difficult at all Not difficult at all       10/28/2022    9:57 AM 09/21/2022    2:58 PM 04/27/2022    9:38 AM  Depression screen PHQ 2/9  Decreased Interest 0 0 0  Down, Depressed, Hopeless 0 0 0  PHQ - 2 Score 0 0 0  Altered sleeping 0 0 0  Tired, decreased energy 0 0 0  Change in appetite 0 0 0  Feeling bad or failure about yourself  0 0 0  Trouble concentrating 0 0 0  Moving slowly or fidgety/restless 0 0 0  Suicidal thoughts 0 0 0  PHQ-9 Score 0 0 0  Difficult doing work/chores Not difficult at all Not difficult at all Not difficult at all    BP Readings from Last 3 Encounters:  10/28/22 122/70  09/21/22 128/78  04/27/22 118/60    Physical Exam Vitals and nursing note reviewed. Exam conducted with a chaperone present.  Constitutional:      General: She is not in acute distress.    Appearance: She is not diaphoretic.  HENT:     Head: Normocephalic and atraumatic.     Right Ear: External ear  normal.     Left Ear: External ear normal.     Nose: Nose normal.  Eyes:     General:  Right eye: No discharge.        Left eye: No discharge.     Extraocular Movements: Extraocular movements intact.     Conjunctiva/sclera: Conjunctivae normal.     Pupils: Pupils are equal, round, and reactive to light.  Neck:     Thyroid: No thyromegaly.     Vascular: No JVD.  Cardiovascular:     Rate and Rhythm: Normal rate and regular rhythm.     Heart sounds: Normal heart sounds. No murmur heard.    No friction rub. No gallop.  Pulmonary:     Effort: Pulmonary effort is normal.     Breath sounds: Normal breath sounds.  Abdominal:     General: Bowel sounds are normal.     Palpations: Abdomen is soft. There is no mass.     Tenderness: There is no abdominal tenderness. There is no guarding.  Musculoskeletal:        General: Normal range of motion.     Cervical back: Normal range of motion and neck supple.  Lymphadenopathy:     Cervical: No cervical adenopathy.  Skin:    General: Skin is warm and dry.     Findings: No erythema or rash.  Neurological:     Mental Status: She is alert.     Deep Tendon Reflexes: Reflexes are normal and symmetric.     Wt Readings from Last 3 Encounters:  10/28/22 160 lb (72.6 kg)  09/21/22 165 lb (74.8 kg)  04/27/22 158 lb (71.7 kg)    BP 122/70   Pulse 72   Ht _0  (1.651 m)   Wt 160 lb (72.6 kg)   SpO2 95%   BMI 26.63 kg/m   Assessment and Plan:  1. Essential hypertension Chronic.  Controlled.  Stable.  Blood pressure 122/70.  Continue amlodipine 5 mg once a day hydrochlorothiazide 25 mg once a day lisinopril 10 mg once a day and metoprolol XL 50 mg once a day chronic.  Controlled.  Stable.  Continue Flonase and Singulair 10 mg once a day..  Patient tolerating medications well with asymptomatic presentation.  Will check CMP for current electrolytes and GFR. - amLODipine (NORVASC) 5 MG tablet; Take 1 tablet (5 mg total) by mouth daily.   Dispense: 90 tablet; Refill: 0 - hydrochlorothiazide (HYDRODIURIL) 25 MG tablet; Take 1 tablet (25 mg total) by mouth every morning.  Dispense: 90 tablet; Refill: 0 - lisinopril (ZESTRIL) 10 MG tablet; Take 1 tablet (10 mg total) by mouth daily.  Dispense: 90 tablet; Refill: 1 - Comprehensive Metabolic Panel (CMET) - metoprolol succinate (TOPROL-XL) 50 MG 24 hr tablet; Take 1.5 tablets (75 mg total) by mouth daily. Take 1 and 1/2 tabs daily  Dispense: 135 tablet; Refill: 1  2. Seasonal allergic rhinitis due to pollen Chronic.  Controlled.  Stable.  Continue Flonase nasal spray 1 spray daily as well as Singulair 10 mg once a day at bedtime. - fluticasone (FLONASE) 50 MCG/ACT nasal spray; Place 1 spray into both nostrils daily.  Dispense: 16 g; Refill: 1 - montelukast (SINGULAIR) 10 MG tablet; Take 1 tablet (10 mg total) by mouth at bedtime.  Dispense: 90 tablet; Refill: 0  3. Gastroesophageal reflux disease, unspecified whether esophagitis present Chronic.  Controlled.  Stable.  Continue pantoprazole 40 mg twice a day. - pantoprazole (PROTONIX) 40 MG tablet; Take 1 tablet (40 mg total) by mouth 2 (two) times daily.  Dispense: 180 tablet; Refill: 1  4. Hyperlipidemia, unspecified hyperlipidemia type Chronic.  Controlled.  Stable.  Continue rosuvastatin 20 mg once a day. - rosuvastatin (CRESTOR) 20 MG tablet; Take 1 tablet (20 mg total) by mouth daily.  Dispense: 90 tablet; Refill: 1 - Lipid Panel With LDL/HDL Ratio  5. Migraine with aura and without status migrainosus, not intractable Chronic.  Controlled.  Stable.  Continue sumatriptan 50 mg tablets as needed migraine. - SUMAtriptan (IMITREX) 50 MG tablet; Take 1 tablet (50 mg total) by mouth as needed. Per 90 days  Dispense: 10 tablet; Refill: 1  6. Diabetes mellitus type 2 without retinopathy (HCC) Chronic.  Microalbuminuria for current status of renal concern. - Microalbumin / creatinine urine ratio  7. Need for COVID-19  vaccine Discussed and administered [employer] Fall 2023 Covid-19 Vaccine 48yr and older    DOtilio Miu MD

## 2022-10-29 LAB — COMPREHENSIVE METABOLIC PANEL
ALT: 22 IU/L (ref 0–32)
AST: 23 IU/L (ref 0–40)
Albumin/Globulin Ratio: 2 (ref 1.2–2.2)
Albumin: 4.7 g/dL (ref 3.8–4.8)
Alkaline Phosphatase: 50 IU/L (ref 44–121)
BUN/Creatinine Ratio: 14 (ref 12–28)
BUN: 11 mg/dL (ref 8–27)
Bilirubin Total: 0.3 mg/dL (ref 0.0–1.2)
CO2: 25 mmol/L (ref 20–29)
Calcium: 9.6 mg/dL (ref 8.7–10.3)
Chloride: 100 mmol/L (ref 96–106)
Creatinine, Ser: 0.8 mg/dL (ref 0.57–1.00)
Globulin, Total: 2.4 g/dL (ref 1.5–4.5)
Glucose: 160 mg/dL — ABNORMAL HIGH (ref 70–99)
Potassium: 4.7 mmol/L (ref 3.5–5.2)
Sodium: 139 mmol/L (ref 134–144)
Total Protein: 7.1 g/dL (ref 6.0–8.5)
eGFR: 78 mL/min/{1.73_m2} (ref >59)

## 2022-10-29 LAB — MICROALBUMIN / CREATININE URINE RATIO
Creatinine, Urine: 36.6 mg/dL
Microalb/Creat Ratio: <8 mg/g creat (ref 0–29)
Microalbumin, Urine: <3 ug/mL

## 2022-10-29 LAB — LIPID PANEL WITH LDL/HDL RATIO
Cholesterol, Total: 127 mg/dL (ref 100–199)
HDL: 39 mg/dL — ABNORMAL LOW (ref >39)
LDL Chol Calc (NIH): 58 mg/dL (ref 0–99)
LDL/HDL Ratio: 1.5 ratio (ref 0.0–3.2)
Triglycerides: 182 mg/dL — ABNORMAL HIGH (ref 0–149)
VLDL Cholesterol Cal: 30 mg/dL (ref 5–40)

## 2022-10-30 ENCOUNTER — Telehealth: Payer: Self-pay | Admitting: Licensed Clinical Social Worker

## 2022-10-30 NOTE — Telephone Encounter (Signed)
I contacted Ms. Albo to discuss her genetic testing results. No pathogenic variants were identified in the 47 genes analyzed. Detailed clinic note to follow.   The test report has been scanned into EPIC and is located under the Molecular Pathology section of the Results Review tab.  A portion of the result report is included below for reference.      Faith Rogue, MS, [facility] Genetic Counselor [email or web address] Phone: [phone]

## 2022-11-02 ENCOUNTER — Encounter: Payer: Self-pay | Admitting: Licensed Clinical Social Worker

## 2022-11-02 ENCOUNTER — Ambulatory Visit: Payer: Self-pay | Admitting: Licensed Clinical Social Worker

## 2022-11-02 DIAGNOSIS — Z1379 Encounter for other screening for genetic and chromosomal anomalies: Secondary | ICD-10-CM

## 2022-11-02 NOTE — Progress Notes (Signed)
HPI:   Nicole Cook was previously seen in the [place] clinic due to a family history of cancer and concerns regarding a hereditary predisposition to cancer. Please refer to our prior cancer genetics clinic note for more information regarding our discussion, assessment and recommendations, at the time. Nicole Cook recent genetic test results were disclosed to her, as were recommendations warranted by these results. These results and recommendations are discussed in more detail below.  CANCER HISTORY:  Oncology History   No history exists.    FAMILY HISTORY:  We obtained a detailed, 4-generation family history.  Significant diagnoses are listed below: Family History  Problem Relation Age of Onset   Breast cancer Mother 62   Arthritis Mother    Melanoma Mother    COPD Mother    Diabetes Mother    Hearing loss Mother    Heart disease Mother    Hypertension Mother    Stroke Mother    Parkinson's disease Father    Diabetes Father    Heart disease Father    Colon cancer Father 89   Prostate cancer Father        dx late 32s   Breast cancer Sister 33   Alcohol abuse Brother    Diabetes Brother    Diabetes Brother    Vaginal cancer Maternal Aunt        dx 31s   Bladder Cancer Maternal Uncle        dx 63s   Breast cancer Paternal Aunt        dx 68s   Stroke Maternal Grandfather    Cancer Paternal Grandmother    Pancreatic cancer Paternal Grandmother     Nicole Cook has 2 sons, 1 daughter. She has 3 brothers, 1 sister. Her sister had breast cancer at 60/61 and had negative BRCA testing. Her cancer was HER2+.    Nicole Cook mother also had HER2+ breast cancer, diagnosed at 83. She also had melanoma before the breast cancer. Maternal uncle had bladder cancer in his 73s. Maternal aunt had vaginal cancer in her 41s that adhered to her intestinal wall.    Nicole Cook father had colon cancer at 3 and prostate cancer in his late 54s. Paternal aunt had breast  cancer in her 20s. Paternal grandmother had pancreatic cancer in her mid 4s.    Nicole Cook is aware of previous family history of genetic testing for hereditary cancer risks. There is no reported Ashkenazi Jewish ancestry. There is no known consanguinity.     GENETIC TEST RESULTS:  The [facility] Common Hereditary Cancer+RNA Panel found no pathogenic mutations.   The Common Hereditary Cancers Panel + RNA offered by [facility] includes sequencing and/or deletion duplication testing of the following 47 genes: APC, ATM, AXIN2, BARD1, BMPR1A, BRCA1, BRCA2, BRIP1, CDH1, CDKN2A (p14ARF), CDKN2A (p16INK4a), CKD4, CHEK2, CTNNA1, DICER1, EPCAM (Deletion/duplication testing only), GREM1 (promoter region deletion/duplication testing only), KIT, MEN1, MLH1, MSH2, MSH3, MSH6, MUTYH, NBN, NF1, NHTL1, PALB2, PDGFRA, PMS2, POLD1, POLE, PTEN, RAD50, RAD51C, RAD51D, SDHB, SDHC, SDHD, SMAD4, SMARCA4. STK11, TP53, TSC1, TSC2, and VHL.  The following genes were evaluated for sequence changes only: SDHA and HOXB13 c.251G>A variant only.   The test report has been scanned into EPIC and is located under the Molecular Pathology section of the Results Review tab.  A portion of the result report is included below for reference. Genetic testing reported out on 10/29/2022.     Even though a pathogenic variant was not identified, possible explanations for  the cancer in the family may include: There may be no hereditary risk for cancer in the family. The cancers in Nicole Cook and/or her family may be sporadic/familial or due to other genetic and environmental factors. There may be a gene mutation in one of these genes that current testing methods cannot detect but that chance is small. There could be another gene that has not yet been discovered, or that we have not yet tested, that is responsible for the cancer diagnoses in the family.  It is also possible there is a hereditary cause for the cancer in the family that Nicole Cook  did not inherit.   Therefore, it is important to remain in touch with cancer genetics in the future so that we can continue to offer Nicole Cook the most up to date genetic testing.   ADDITIONAL GENETIC TESTING:  We discussed with Nicole Cook that her genetic testing was fairly extensive.  If there are additional relevant genes identified to increase cancer risk that can be analyzed in the future, we would be happy to discuss and coordinate this testing at that time.    CANCER SCREENING RECOMMENDATIONS:  Nicole Cook test result is considered negative (normal).  This means that we have not identified a hereditary cause for her family history of cancer at this time.   An individual's cancer risk and medical management are not determined by genetic test results alone. Overall cancer risk assessment incorporates additional factors, including personal medical history, family history, and any available genetic information that may result in a personalized plan for cancer prevention and surveillance. Therefore, it is recommended she continue to follow the cancer management and screening guidelines provided by her primary healthcare provider.  RECOMMENDATIONS FOR FAMILY MEMBERS:   Since she did not inherit a identifiable mutation in a cancer predisposition gene included on this panel, her children could not have inherited a known mutation from her in one of these genes. Individuals in this family might be at some increased risk of developing cancer, over the general population risk, due to the family history of cancer.  Individuals in the family should notify their providers of the family history of cancer. We recommend women in this family have a yearly mammogram beginning at age 66, or 60 years younger than the earliest onset of cancer, an annual clinical breast exam, and perform monthly breast self-exams.  Family members should have colonoscopies by at age 82, or earlier, as recommended by their  providers. Other members of the family may still carry a pathogenic variant in one of these genes that Nicole Cook did not inherit. Based on the family history, we recommend her paternal relatives have genetic counseling and testing. Nicole Cook will let us know if we can be of any assistance in coordinating genetic counseling and/or testing for this family member.    FOLLOW-UP:  Lastly, we discussed with Nicole Cook that cancer genetics is a rapidly advancing field and it is possible that new genetic tests will be appropriate for her and/or her family members in the future. We encouraged her to remain in contact with cancer genetics on an annual basis so we can update her personal and family histories and let her know of advances in cancer genetics that may benefit this family.   Our contact number was provided. Nicole Cook questions were answered to her satisfaction, and she knows she is welcome to call us at anytime with additional questions or concerns.    Faith Rogue, MS, [place]  [email or web address], [email or web address] Phone: [phone]

## 2022-11-26 DIAGNOSIS — L570 Actinic keratosis: Secondary | ICD-10-CM | POA: Diagnosis not present

## 2022-11-26 DIAGNOSIS — L820 Inflamed seborrheic keratosis: Secondary | ICD-10-CM | POA: Diagnosis not present

## 2022-11-26 DIAGNOSIS — L810 Postinflammatory hyperpigmentation: Secondary | ICD-10-CM | POA: Diagnosis not present

## 2022-11-26 DIAGNOSIS — Z85828 Personal history of other malignant neoplasm of skin: Secondary | ICD-10-CM | POA: Diagnosis not present

## 2022-11-26 DIAGNOSIS — Z09 Encounter for follow-up examination after completed treatment for conditions other than malignant neoplasm: Secondary | ICD-10-CM | POA: Diagnosis not present

## 2023-01-18 ENCOUNTER — Ambulatory Visit: Payer: Medicare Other

## 2023-01-22 ENCOUNTER — Other Ambulatory Visit: Payer: Self-pay | Admitting: Family Medicine

## 2023-01-22 DIAGNOSIS — I10 Essential (primary) hypertension: Secondary | ICD-10-CM

## 2023-01-22 DIAGNOSIS — J301 Allergic rhinitis due to pollen: Secondary | ICD-10-CM

## 2023-01-28 ENCOUNTER — Ambulatory Visit (INDEPENDENT_AMBULATORY_CARE_PROVIDER_SITE_OTHER): Payer: Medicare Other

## 2023-01-28 VITALS — Ht 65.0 in | Wt 160.0 lb

## 2023-01-28 DIAGNOSIS — Z0000 Encounter for general adult medical examination without abnormal findings: Secondary | ICD-10-CM | POA: Diagnosis not present

## 2023-01-28 NOTE — Progress Notes (Signed)
I connected with  Nicole Cook on 01/28/23 by a audio enabled telemedicine application and verified that I am speaking with the correct person using two identifiers.  Patient Location: Home  Provider Location: Office/Clinic  I discussed the limitations of evaluation and management by telemedicine. The patient expressed understanding and agreed to proceed.  Subjective:   Nicole Cook is a 72 y.o. female who presents for Medicare Annual (Subsequent) preventive examination.  Review of Systems     Cardiac Risk Factors include: advanced age (>71mn, >>16women);dyslipidemia;hypertension;diabetes mellitus     Objective:    Today's Vitals   01/28/23 1330  PainSc: 3    There is no height or weight on file to calculate BMI.     01/28/2023    1:38 PM 01/14/2022    3:37 PM 11/12/2021    1:21 PM 12/25/2020   10:40 AM 12/25/2019   10:38 AM 11/23/2018   11:44 AM 11/15/2017    1:59 PM  Advanced Directives  Does Patient Have a Medical Advance Directive? No No No No No No No  Would patient like information on creating a medical advance directive? No - Patient declined No - Patient declined  No - Patient declined Yes (MAU/Ambulatory/Procedural Areas - Information given) Yes (MAU/Ambulatory/Procedural Areas - Information given) Yes (MAU/Ambulatory/Procedural Areas - Information given)    Current Medications (verified) Outpatient Encounter Medications as of 01/28/2023  Medication Sig   ACCU-CHEK FASTCLIX LANCETS MISC Accu-Chek Fastclix Lancet Drum   amLODipine (NORVASC) 5 MG tablet TAKE 1 TABLET DAILY   aspirin 81 MG tablet Take 1 tablet by mouth daily.   cetirizine (ZYRTEC) 10 MG tablet Take 10 mg by mouth daily. otc   Cholecalciferol (VITAMIN D3) 1000 units CAPS Take 1 capsule by mouth daily.   Coenzyme Q10 300 MG CAPS Take 1 capsule by mouth daily.   Dulaglutide 1.5 MG/0.5ML SOPN Inject 1 each into the skin once a week. endo ..Marland Kitchenunday   estradiol (ESTRING) 2 MG vaginal ring Place 2  mg vaginally every 3 (three) months. follow package directions- GYN   fluticasone (FLONASE) 50 MCG/ACT nasal spray Place 1 spray into both nostrils daily.   gabapentin (NEURONTIN) 300 MG capsule Take 2 capsules by mouth 3 (three) times daily. [facility]- may take 3 capsules 3 times per day per pt   hydrochlorothiazide (HYDRODIURIL) 25 MG tablet TAKE 1 TABLET EVERY MORNING   HYDROcodone-acetaminophen (NORCO) 10-325 MG per tablet Take 1 tablet by mouth as needed. [facility] Gets 40 tabs monthly   insulin lispro (HUMALOG) 100 UNIT/ML KwikPen    lisinopril (ZESTRIL) 10 MG tablet Take 1 tablet (10 mg total) by mouth daily.   Magnesium 500 MG CAPS Take 1 capsule by mouth daily.   metFORMIN (GLUCOPHAGE) 1000 MG tablet 1 tablet 2 (two) times daily.   metoprolol succinate (TOPROL-XL) 50 MG 24 hr tablet Take 1.5 tablets (75 mg total) by mouth daily. Take 1 and 1/2 tabs daily   montelukast (SINGULAIR) 10 MG tablet TAKE 1 TABLET AT BEDTIME   Multiple Vitamins-Minerals (CENTRUM SILVER ULTRA WOMENS PO) Take 1 tablet by mouth daily.   nitroGLYCERIN (NITROSTAT) 0.4 MG SL tablet Place 1 tablet under the tongue as needed.   olopatadine (PATANOL) 0.1 % ophthalmic solution Place 1 drop into both eyes 2 (two) times daily.   ondansetron (ZOFRAN) 4 MG tablet Take 1 tablet (4 mg total) by mouth every 8 (eight) hours as needed for nausea or vomiting.   ONE TOUCH ULTRA TEST test strip  OXYCONTIN 10 MG 12 hr tablet Take 10 mg by mouth 2 (two) times daily. Pain Dr   pantoprazole (PROTONIX) 40 MG tablet Take 1 tablet (40 mg total) by mouth 2 (two) times daily.   rosuvastatin (CRESTOR) 20 MG tablet Take 1 tablet (20 mg total) by mouth daily.   SUMAtriptan (IMITREX) 50 MG tablet Take 1 tablet (50 mg total) by mouth as needed. Per 90 days   vitamin B-12 (CYANOCOBALAMIN) 1000 MCG tablet Take 1,000 mcg by mouth daily.   No facility-administered encounter medications on file as of 01/28/2023.    Allergies (verified) Aspartame,  Compazine [prochlorperazine], Penicillins, Sulfa antibiotics, and Sulfamethoxazole-trimethoprim   History: Past Medical History:  Diagnosis Date   Age-related nuclear cataract of both eyes 03/09/2021   Allergy    Arthritis    Cancer ([place])    basal cell skin ca   Diabetes mellitus without complication ([place])    GERD (gastroesophageal reflux disease)    Hyperlipidemia    Hypertension    Migraine    Myocardial infarction [facility]) May, 2011   Oxygen deficiency    CPAP   Pelvic floor relaxation    Sleep apnea    Thoracic or lumbosacral neuritis or radiculitis, unspecified    Past Surgical History:  Procedure Laterality Date   back surgery x 2     c-section x 3     [place], 1979, 1982   COLONOSCOPY  2011   cleared for 5 yrs- Dr Beckey Downing   EYE SURGERY     eyelid lesion   KNEE ARTHROSCOPY Right 2010   [place]  2003,2006   TUBAL LIGATION  1982   VAGINAL HYSTERECTOMY  12/14/1994   Family History  Problem Relation Age of Onset   Breast cancer Mother 30   Arthritis Mother    Melanoma Mother    COPD Mother    Diabetes Mother    Hearing loss Mother    Heart disease Mother    Hypertension Mother    Stroke Mother    Parkinson's disease Father    Diabetes Father    Heart disease Father    Colon cancer Father 83   Prostate cancer Father        dx late 26s   Breast cancer Sister 79   Alcohol abuse Brother    Diabetes Brother    Diabetes Brother    Vaginal cancer Maternal Aunt        dx 69s   Bladder Cancer Maternal Uncle        dx 37s   Breast cancer Paternal Aunt        dx 11s   Stroke Maternal Grandfather    Cancer Paternal Grandmother    Pancreatic cancer Paternal Grandmother    Social History   Socioeconomic History   Marital status: Married    Spouse name: Not on file   Number of children: 3   Years of education: Not on file   Highest education level: Bachelor's degree (e.g., BA, AB, BS)  Occupational History   Occupation: Retired   Tobacco Use   Smoking status: Never   Smokeless tobacco: Never  Vaping Use   Vaping Use: Never used  Substance and Sexual Activity   Alcohol use: No    Alcohol/week: 0.0 standard drinks of alcohol   Drug use: No   Sexual activity: Yes    Birth control/protection: Post-menopausal  Other Topics Concern   Not on file  Social History Narrative   Not on  file   Social Determinants of Health   Financial Resource Strain: Low Risk  (01/28/2023)   Overall Financial Resource Strain (CARDIA)    Difficulty of Paying Living Expenses: Not hard at all  Food Insecurity: No Food Insecurity (01/28/2023)   Hunger Vital Sign    Worried About Running Out of Food in the Last Year: Never true    Ran Out of Food in the Last Year: Never true  Transportation Needs: No Transportation Needs (01/28/2023)   PRAPARE - Hydrologist (Medical): No    Lack of Transportation (Non-Medical): No  Physical Activity: Insufficiently Active (01/28/2023)   Exercise Vital Sign    Days of Exercise per Week: 3 days    Minutes of Exercise per Session: 30 min  Stress: No Stress Concern Present (01/28/2023)   [place]    Feeling of Stress : Not at all  Social Connections: Moderately Integrated (01/28/2023)   Social Connection and Isolation Panel [NHANES]    Frequency of Communication with Friends and Family: More than three times a week    Frequency of Social Gatherings with Friends and Family: More than three times a week    Attends Religious Services: More than 4 times per year    Active Member of [employer] or Organizations: No    Attends Music therapist: Never    Marital Status: Married    Tobacco Counseling Counseling given: Not Answered   Clinical Intake:  Pre-visit preparation completed: Yes  Pain : 0-10 Pain Score: 3  Pain Type: Chronic pain Pain Location: Back     Nutritional Risks: None Diabetes:  Yes CBG done?: No Did pt. bring in CBG monitor from home?: No  How often do you need to have someone help you when you read instructions, pamphlets, or other written materials from your doctor or pharmacy?: 1 - Never  Diabetic?yes Nutrition Risk Assessment:  Has the patient had any N/V/D within the last 2 months?  Yes  Does the patient have any non-healing wounds?  No  Has the patient had any unintentional weight loss or weight gain?  No   Diabetes:  Is the patient diabetic?  Yes  If diabetic, was a CBG obtained today?  No  Did the patient bring in their glucometer from home?  No  How often do you monitor your CBG's? Every day.   Financial Strains and Diabetes Management:  Are you having any financial strains with the device, your supplies or your medication? No .  Does the patient want to be seen by Chronic Care Management for management of their diabetes?  No  Would the patient like to be referred to a Nutritionist or for Diabetic Management?  No   Diabetic Exams:  Diabetic Eye Exam: Completed 03/11/22.  Pt has been advised about the importance in completing this exam.  Diabetic Foot Exam: Completed 10/28/22. Pt has been advised about the importance in completing this exam.   Interpreter Needed?: No  Information entered by :: Kirke Shaggy, LPN   Activities of Daily Living    01/28/2023    1:39 PM  In your present state of health, do you have any difficulty performing the following activities:  Hearing? 0  Vision? 0  Difficulty concentrating or making decisions? 0  Walking or climbing stairs? 1  Dressing or bathing? 0  Doing errands, shopping? 0  Preparing Food and eating ? N  Using the Toilet? N  In  the past six months, have you accidently leaked urine? N  Do you have problems with loss of bowel control? N  Managing your Medications? N  Managing your Finances? N  Housekeeping or managing your Housekeeping? N    Patient Care Team: Juline Patch, MD as PCP -  General (Family Medicine) Vergia Alcon, MD as Consulting Physician (Cardiology) Krystal Eaton, MD as Consulting Physician (Occupational Medicine) Enos Fling, MD as Consulting Physician (Obstetrics and Gynecology) Lonia Farber, MD as Consulting Physician (Internal Medicine)  Indicate any recent Medical Services you may have received from other than [facility] providers in the past year (date may be approximate).     Assessment:   This is a routine wellness examination for [place].  Hearing/Vision screen Hearing Screening - Comments:: No aids Vision Screening - Comments:: Readers- [place] issues and exercise activities discussed: Current Exercise Habits: Home exercise routine, Time (Minutes): 30, Frequency (Times/Week): 3, Weekly Exercise (Minutes/Week): 90, Intensity: Mild   Goals Addressed             This Visit's Progress    DIET - EAT MORE FRUITS AND VEGETABLES         Depression Screen    01/28/2023    1:36 PM 10/28/2022    9:57 AM 09/21/2022    2:58 PM 04/27/2022    9:38 AM 01/14/2022    3:37 PM 01/14/2022    9:43 AM 10/28/2021    9:38 AM  PHQ 2/9 Scores  PHQ - 2 Score 0 0 0 0 0 0 0  PHQ- 9 Score 0 0 0 0  0 1    Fall Risk    01/28/2023    1:38 PM 10/28/2022    9:56 AM 09/21/2022    2:58 PM 01/14/2022    3:38 PM 10/28/2021    9:38 AM  [place] in the past year? 0 0 0 0 0  Number falls in past yr: 0 0 0 0 0  Injury with Fall? 0 0 0 0 0  Risk for fall due to : No Fall Risks No Fall Risks No Fall Risks No Fall Risks No Fall Risks  Follow up Falls prevention discussed;Falls evaluation completed Falls evaluation completed Falls evaluation completed Falls prevention discussed Falls evaluation completed    FALL RISK PREVENTION PERTAINING TO THE HOME:  Any stairs in or around the home? Yes  If so, are there any without handrails? No  Home free of loose throw rugs in walkways, pet beds, electrical cords, etc? Yes  Adequate  lighting in your home to reduce risk of falls? Yes   ASSISTIVE DEVICES UTILIZED TO PREVENT FALLS:  Life alert? No  Use of a cane, walker or w/c? No - has used cane if pain in knee flares Grab bars in the bathroom? Yes  Shower chair or bench in shower? Yes  Elevated toilet seat or a handicapped toilet? Yes    Cognitive Function:        01/28/2023    1:50 PM 12/25/2019   10:44 AM 11/15/2017    1:45 PM  6CIT Screen  What Year? 0 points 0 points 0 points  What month? 0 points 0 points 0 points  What time? 0 points 0 points 0 points  Count back from 20 0 points 0 points 0 points  Months in reverse 0 points 0 points 0 points  Repeat phrase 0 points 0 points 4 points  Total Score 0 points  0 points 4 points    Immunizations Immunization History  Administered Date(s) Administered   COVID-19, mRNA, vaccine(Comirnaty)12 years and older 10/28/2022   Fluad Quad(high Dose 65+) 10/02/2019, 10/08/2020, 09/21/2022   Hep B, Unspecified 12/14/1998   Hepatitis B 12/14/1998   Influenza, High Dose Seasonal PF 09/06/2017, 09/26/2018   Influenza,inj,Quad PF,6+ Mos 09/02/2015, 09/02/2016   Influenza-Unspecified 09/13/2012, 08/14/2013, 01/10/2015, 02/10/2015, 08/15/2015, 08/14/2017, 11/20/2019, 09/13/2021   MMR 05/14/1981   Moderna Sars-Covid-2 Vaccination 10/31/2020   PFIZER(Purple Top)SARS-COV-2 Vaccination 01/24/2020, 02/14/2020   Pneumococcal Polysaccharide-23 08/29/2014   Pneumococcal-Unspecified 05/14/2009, 08/14/2014   Td 12/14/2005   Tdap 01/14/2007, 02/12/2016, 03/02/2017   Zoster, Live 01/14/2015, 02/10/2015    TDAP status: Up to date  Flu Vaccine status: Up to date  Pneumococcal vaccine status: Up to date  Covid-19 vaccine status: Completed vaccines  Qualifies for Shingles Vaccine? Yes   Zostavax completed Yes   Shingrix Completed?: No.    Education has been provided regarding the importance of this vaccine. Patient has been advised to call insurance company to determine out  of pocket expense if they have not yet received this vaccine. Advised may also receive vaccine at local pharmacy or Health Dept. Verbalized acceptance and understanding.  Screening Tests Health Maintenance  Topic Date Due   Zoster Vaccines- Shingrix (1 of 2) Never done   Pneumonia Vaccine 50+ Years old (2 of 2 - PCV) 08/30/2015   COLONOSCOPY (Pts 45-28yr Insurance coverage will need to be confirmed)  01/24/2023   OPHTHALMOLOGY EXAM  03/12/2023   HEMOGLOBIN A1C  03/30/2023   MAMMOGRAM  10/01/2023   Diabetic kidney evaluation - eGFR measurement  10/29/2023   Diabetic kidney evaluation - Urine ACR  10/29/2023   FOOT EXAM  10/29/2023   Medicare Annual Wellness (AWV)  01/29/2024   DTaP/Tdap/Td (5 - Td or Tdap) 03/03/2027   INFLUENZA VACCINE  Completed   DEXA SCAN  Completed   COVID-19 Vaccine  Completed   Hepatitis C Screening  Completed   HPV VACCINES  Aged Out    Health Maintenance  Health Maintenance Due  Topic Date Due   Zoster Vaccines- Shingrix (1 of 2) Never done   Pneumonia Vaccine 74 Years old (2 of 2 - PCV) 08/30/2015   COLONOSCOPY (Pts 45-460yrInsurance coverage will need to be confirmed)  01/24/2023    Colorectal cancer screening: Type of screening: Colonoscopy. Completed 01/24/18. Repeat every 5 years- will call to schedule soon  Mammogram status: Completed 09/30/22. Repeat every year  Bone Density status: Completed 01/17/20. Results reflect: Bone density results: NORMAL. Repeat every 5 years.  Lung Cancer Screening: (Low Dose CT Chest recommended if Age 372-80ears, 30 pack-year currently smoking OR have quit w/in 15years.) does not qualify.   Additional Screening:  Hepatitis C Screening: does qualify; Completed 03/02/16  Vision Screening: Recommended annual ophthalmology exams for early detection of glaucoma and other disorders of the eye. Is the patient up to date with their annual eye exam?  Yes  Who is the provider or what is the name of the office in which  the patient attends annual eye exams? [facility] is not established with a provider, would they like to be referred to a provider to establish care? No .   Dental Screening: Recommended annual dental exams for proper oral hygiene  Community Resource Referral / Chronic Care Management: CRR required this visit?  No   CCM required this visit?  No      Plan:  I have personally reviewed and noted the following in the patient's chart:   Medical and social history Use of alcohol, tobacco or illicit drugs  Current medications and supplements including opioid prescriptions. Patient is currently taking opioid prescriptions. Information provided to patient regarding non-opioid alternatives. Patient advised to discuss non-opioid treatment plan with their provider. Functional ability and status Nutritional status Physical activity Advanced directives List of other physicians Hospitalizations, surgeries, and ER visits in previous 12 months Vitals Screenings to include cognitive, depression, and falls Referrals and appointments  In addition, I have reviewed and discussed with patient certain preventive protocols, quality metrics, and best practice recommendations. A written personalized care plan for preventive services as well as general preventive health recommendations were provided to patient.     Dionisio David, LPN   [id number]   Nurse Notes: none

## 2023-01-28 NOTE — Patient Instructions (Signed)
Nicole Cook , Thank you for taking time to come for your Medicare Wellness Visit. I appreciate your ongoing commitment to your health goals. Please review the following plan we discussed and let me know if I can assist you in the future.   These are the goals we discussed:  Goals      DIET - EAT MORE FRUITS AND VEGETABLES     DIET - INCREASE WATER INTAKE     Recommend to drink at least 6-8 8oz glasses of water per day      Monitor and Manage My Blood Sugar-Diabetes Type 2     Timeframe:  Long-Range Goal Priority:  Medium Start Date:                             Expected End Date:                       Follow Up Date 3 month follow up   - check blood sugar at prescribed times - check blood sugar before and after exercise - check blood sugar if I feel it is too high or too low - enter blood sugar readings and medication or insulin into daily log - take the blood sugar meter to all doctor visits    Why is this important?   Checking your blood sugar at home helps to keep it from getting very high or very low.  Writing the results in a diary or log helps the doctor know how to care for you.  Your blood sugar log should have the time, date and the results.  Also, write down the amount of insulin or other medicine that you take.  Other information, like what you ate, exercise done and how you were feeling, will also be helpful.     Notes:      Patient Stated     Patient states she would like to lower A1c to less that 7 over the next yeat. Currently 7.2 on 11/15/20.      PharmD [place] (see longitudinal plan of care for additional care plan information)  Current Barriers:  Chronic Disease Management support, education, and care coordination needs related to Hypertension, Hyperlipidemia, Diabetes, and Coronary Artery Disease   Hypertension BP Readings from Last 3 Encounters:  05/21/20 120/60  01/11/20 120/60  11/20/19 130/60  Pharmacist Clinical  Goal(s): Over the next 90days, patient will work with PharmD and providers to maintain BP goal <130/80 Current regimen:  Lisinopril 10 mg daily  Hydrochlorothiazide 25 mg daily Metoprolol succinate 75 mg daily (takes 25 mg + 50 mg) Amlodipine 5 mg daily Interventions: Comprehensive medication review performed, medication list updated in electronic medical record Inter-disciplinary care team collaboration (see longitudinal plan of care) Discussed  dosage delivery design of Toprol XL allows tablets to be halved. PharmD will collaborate with PCP if 50 mg 1.5 tablet RX will save patient on copays Patient self care activities - Over the next 90 days, patient will: Check BP weekly document, and provide at future appointments Ensure daily salt intake < 2300 mg/day Contact insurance regarding copay savings of Metoprolol succinate 50 mg 1.5 tabs daily Contact PharmD for current tablet verification.  Hyperlipidemia/CAD Lab Results  Component Value Date/Time   [facility] 64 05/21/2020 09:53 AM  Pharmacist Clinical Goal(s): Over the next 90 days, patient will work with PharmD and providers to maintain LDL goal < 70  Current regimen:  Rosuvastatin 20 mg Aspirin 81 mg qd Nitrostat 0.4 mg prn  Interventions: Comprehensive medication review performed, medication list updated in electronic medical record Inter-disciplinary care team collaboration (see longitudinal plan of care)  Patient self care activities - Over the next 90 days, patient will: Continue exercise regimen aiming for 30 minutes/day 5 days/week. Continue following DASH diet.  Diabetes Lab Results  Component Value Date/Time   HGBA1C 6.8 05/03/2020 12:00 AM   HGBA1C 6.4 11/14/2019 12:00 AM  Pharmacist Clinical Goal(s): Over the next 90 days, patient will work with PharmD and providers to maintain A1c goal <7% Current regimen:  Trulicity [id number] mg weekly on Sunday Metformin 1000 mg bid Insulin lispro 2-8 units before meals  prn Interventions: Comprehensive medication review performed, medication list updated in electronic medical record Inter-disciplinary care team collaboration (see longitudinal plan of care) Will initiate patient assistance for Trulicity Patient self care activities - Over the next 90 days, patient will: Check blood sugar once daily, document, and provide at future appointments Contact provider with any episodes of hypoglycemia Provide requested portion of PAP   Postmenopausal Vaginal Atrophy Current Regimen: Estring vaginal ring every 3 weeks Interventions: Will research available patient assistance options and initiate process for copay assistance Patient self care activities - Over the next 90 days, patient will: Provide requested portion of PAP application  Medication management Pharmacist Clinical Goal(s): Over the next 90 days, patient will work with PharmD and providers to maintain optimal medication adherence [facility] Interventions Comprehensive medication review performed. Continue current medication management strategy Patient self care activities - Over the next 90 days, patient will Take medications as prescribed Report any questions or concerns to PharmD and/or provider(s)  Initial goal documentation      Track and Manage My Blood Pressure-Hypertension     Timeframe:  Long-Range Goal Priority:  Medium Start Date:                             Expected End Date:                       Follow Up Date 3 month follow up   - check blood pressure weekly - write blood pressure results in a log or diary    Why is this important?   You won't feel high blood pressure, but it can still hurt your blood vessels.  High blood pressure can cause heart or kidney problems. It can also cause a stroke.  Making lifestyle changes like losing a little weight or eating less salt will help.  Checking your blood pressure at home and at different times of the  day can help to control blood pressure.  If the doctor prescribes medicine remember to take it the way the doctor ordered.  Call the office if you cannot afford the medicine or if there are questions about it.     Notes:         This is a list of the screening recommended for you and due dates:  Health Maintenance  Topic Date Due   Zoster (Shingles) Vaccine (1 of 2) Never done   Pneumonia Vaccine (2 of 2 - PCV) 08/30/2015   Colon Cancer Screening  01/24/2023   Eye exam for diabetics  03/12/2023   Hemoglobin A1C  03/30/2023   Mammogram  10/01/2023   Yearly kidney function blood test for diabetes  10/29/2023   Yearly  kidney health urinalysis for diabetes  10/29/2023   Complete foot exam   10/29/2023   Medicare Annual Wellness Visit  01/29/2024   DTaP/Tdap/Td vaccine (5 - Td or Tdap) 03/03/2027   Flu Shot  Completed   DEXA scan (bone density measurement)  Completed   COVID-19 Vaccine  Completed   Hepatitis C Screening: USPSTF Recommendation to screen - Ages 35-79 yo.  Completed   HPV Vaccine  Aged Out    Advanced directives: no  Conditions/risks identified: none  Next appointment: Follow up in one year for your annual wellness visit 02/03/24 @ 1:30 pm by phone   Preventive Care 65 Years and Older, Female Preventive care refers to lifestyle choices and visits with your health care provider that can promote health and wellness. What does preventive care include? A yearly physical exam. This is also called an annual well check. Dental exams once or twice a year. Routine eye exams. Ask your health care provider how often you should have your eyes checked. Personal lifestyle choices, including: Daily care of your teeth and gums. Regular physical activity. Eating a healthy diet. Avoiding tobacco and drug use. Limiting alcohol use. Practicing safe sex. Taking low-dose aspirin every day. Taking vitamin and mineral supplements as recommended by your health care provider. What  happens during an annual well check? The services and screenings done by your health care provider during your annual well check will depend on your age, overall health, lifestyle risk factors, and family history of disease. Counseling  Your health care provider may ask you questions about your: Alcohol use. Tobacco use. Drug use. Emotional well-being. Home and relationship well-being. Sexual activity. Eating habits. History of falls. Memory and ability to understand (cognition). Work and work Statistician. Reproductive health. Screening  You may have the following tests or measurements: Height, weight, and BMI. Blood pressure. Lipid and cholesterol levels. These may be checked every 5 years, or more frequently if you are over 64 years old. Skin check. Lung cancer screening. You may have this screening every year starting at age 32 if you have a 30-pack-year history of smoking and currently smoke or have quit within the past 15 years. Fecal occult blood test (FOBT) of the stool. You may have this test every year starting at age 59. Flexible sigmoidoscopy or colonoscopy. You may have a sigmoidoscopy every 5 years or a colonoscopy every 10 years starting at age 37. Hepatitis C blood test. Hepatitis B blood test. Sexually transmitted disease (STD) testing. Diabetes screening. This is done by checking your blood sugar (glucose) after you have not eaten for a while (fasting). You may have this done every 1-3 years. Bone density scan. This is done to screen for osteoporosis. You may have this done starting at age 16. Mammogram. This may be done every 1-2 years. Talk to your health care provider about how often you should have regular mammograms. Talk with your health care provider about your test results, treatment options, and if necessary, the need for more tests. Vaccines  Your health care provider may recommend certain vaccines, such as: Influenza vaccine. This is recommended every  year. Tetanus, diphtheria, and acellular pertussis (Tdap, Td) vaccine. You may need a Td booster every 10 years. Zoster vaccine. You may need this after age 27. Pneumococcal 13-valent conjugate (PCV13) vaccine. One dose is recommended after age 98. Pneumococcal polysaccharide (PPSV23) vaccine. One dose is recommended after age 69. Talk to your health care provider about which screenings and vaccines you need and how often  you need them. This information is not intended to replace advice given to you by your health care provider. Make sure you discuss any questions you have with your health care provider. Document Released: 12/27/2015 Document Revised: 08/19/2016 Document Reviewed: 10/01/2015 Elsevier Interactive Patient Education  2017 [place] Prevention in the Home Falls can cause injuries. They can happen to people of all ages. There are many things you can do to make your home safe and to help prevent falls. What can I do on the outside of my home? Regularly fix the edges of walkways and driveways and fix any cracks. Remove anything that might make you trip as you walk through a door, such as a raised step or threshold. Trim any bushes or trees on the path to your home. Use bright outdoor lighting. Clear any walking paths of anything that might make someone trip, such as rocks or tools. Regularly check to see if handrails are loose or broken. Make sure that both sides of any steps have handrails. Any raised decks and porches should have guardrails on the edges. Have any leaves, snow, or ice cleared regularly. Use sand or salt on walking paths during winter. Clean up any spills in your garage right away. This includes oil or grease spills. What can I do in the bathroom? Use night lights. Install grab bars by the toilet and in the tub and shower. Do not use towel bars as grab bars. Use non-skid mats or decals in the tub or shower. If you need to sit down in the shower, use a  plastic, non-slip stool. Keep the floor dry. Clean up any water that spills on the floor as soon as it happens. Remove soap buildup in the tub or shower regularly. Attach bath mats securely with double-sided non-slip rug tape. Do not have throw rugs and other things on the floor that can make you trip. What can I do in the bedroom? Use night lights. Make sure that you have a light by your bed that is easy to reach. Do not use any sheets or blankets that are too big for your bed. They should not hang down onto the floor. Have a firm chair that has side arms. You can use this for support while you get dressed. Do not have throw rugs and other things on the floor that can make you trip. What can I do in the kitchen? Clean up any spills right away. Avoid walking on wet floors. Keep items that you use a lot in easy-to-reach places. If you need to reach something above you, use a strong step stool that has a grab bar. Keep electrical cords out of the way. Do not use floor polish or wax that makes floors slippery. If you must use wax, use non-skid floor wax. Do not have throw rugs and other things on the floor that can make you trip. What can I do with my stairs? Do not leave any items on the stairs. Make sure that there are handrails on both sides of the stairs and use them. Fix handrails that are broken or loose. Make sure that handrails are as long as the stairways. Check any carpeting to make sure that it is firmly attached to the stairs. Fix any carpet that is loose or worn. Avoid having throw rugs at the top or bottom of the stairs. If you do have throw rugs, attach them to the floor with carpet tape. Make sure that you have a light switch  at the top of the stairs and the bottom of the stairs. If you do not have them, ask someone to add them for you. What else can I do to help prevent falls? Wear shoes that: Do not have high heels. Have rubber bottoms. Are comfortable and fit you  well. Are closed at the toe. Do not wear sandals. If you use a stepladder: Make sure that it is fully opened. Do not climb a closed stepladder. Make sure that both sides of the stepladder are locked into place. Ask someone to hold it for you, if possible. Clearly mark and make sure that you can see: Any grab bars or handrails. First and last steps. Where the edge of each step is. Use tools that help you move around (mobility aids) if they are needed. These include: Canes. Walkers. Scooters. Crutches. Turn on the lights when you go into a dark area. Replace any light bulbs as soon as they burn out. Set up your furniture so you have a clear path. Avoid moving your furniture around. If any of your floors are uneven, fix them. If there are any pets around you, be aware of where they are. Review your medicines with your doctor. Some medicines can make you feel dizzy. This can increase your chance of falling. Ask your doctor what other things that you can do to help prevent falls. This information is not intended to replace advice given to you by your health care provider. Make sure you discuss any questions you have with your health care provider. Document Released: 09/26/2009 Document Revised: 05/07/2016 Document Reviewed: 01/04/2015 Elsevier Interactive Patient Education  2017 [employer].

## 2023-02-15 DIAGNOSIS — E1142 Type 2 diabetes mellitus with diabetic polyneuropathy: Secondary | ICD-10-CM | POA: Diagnosis not present

## 2023-02-15 DIAGNOSIS — E785 Hyperlipidemia, unspecified: Secondary | ICD-10-CM | POA: Diagnosis not present

## 2023-02-15 DIAGNOSIS — Z79899 Other long term (current) drug therapy: Secondary | ICD-10-CM | POA: Diagnosis not present

## 2023-02-15 DIAGNOSIS — E1169 Type 2 diabetes mellitus with other specified complication: Secondary | ICD-10-CM | POA: Diagnosis not present

## 2023-02-15 DIAGNOSIS — I152 Hypertension secondary to endocrine disorders: Secondary | ICD-10-CM | POA: Diagnosis not present

## 2023-02-15 DIAGNOSIS — E1159 Type 2 diabetes mellitus with other circulatory complications: Secondary | ICD-10-CM | POA: Diagnosis not present

## 2023-03-24 DIAGNOSIS — H21233 Degeneration of iris (pigmentary), bilateral: Secondary | ICD-10-CM | POA: Diagnosis not present

## 2023-03-24 DIAGNOSIS — E119 Type 2 diabetes mellitus without complications: Secondary | ICD-10-CM | POA: Diagnosis not present

## 2023-03-24 DIAGNOSIS — H2513 Age-related nuclear cataract, bilateral: Secondary | ICD-10-CM | POA: Diagnosis not present

## 2023-03-24 LAB — HM DIABETES EYE EXAM

## 2023-04-22 ENCOUNTER — Other Ambulatory Visit: Payer: Self-pay | Admitting: Family Medicine

## 2023-04-22 DIAGNOSIS — J301 Allergic rhinitis due to pollen: Secondary | ICD-10-CM

## 2023-04-22 DIAGNOSIS — I10 Essential (primary) hypertension: Secondary | ICD-10-CM

## 2023-04-22 NOTE — Telephone Encounter (Signed)
Requested Prescriptions  Pending Prescriptions Disp Refills   montelukast (SINGULAIR) 10 MG tablet [Pharmacy Med Name: MONTELUKAST SODIUM TABS 10MG ] 90 tablet 3    Sig: TAKE 1 TABLET AT BEDTIME     Pulmonology:  Leukotriene Inhibitors Passed - 04/22/2023 12:10 AM      Passed - Valid encounter within last 12 months    Recent Outpatient Visits           5 months ago Essential hypertension   [place] [facility]   7 months ago Dense breasts   [place] [facility]   12 months ago Essential hypertension   [place] [facility]   1 year ago Urethritis   [place] [facility]   1 year ago Essential hypertension   [place] [facility]       Future Appointments             In 6 days Duanne Limerick, MD [facility], [facility]             hydrochlorothiazide (HYDRODIURIL) 25 MG tablet [Pharmacy Med Name: HYDROCHLOROTHIAZIDE TABS 25MG ] 90 tablet 3    Sig: TAKE 1 TABLET EVERY MORNING     Cardiovascular: Diuretics - Thiazide Passed - 04/22/2023 12:10 AM      Passed - Cr in normal range and within 180 days    Creatinine, Ser  Date Value Ref Range Status  10/28/2022 0.80 0.57 - 1.00 mg/dL Final         Passed - K in normal range and within 180 days    Potassium  Date Value Ref Range Status  10/28/2022 4.7 3.5 - 5.2 mmol/L Final         Passed - Na in normal range and within 180 days    Sodium  Date Value Ref Range Status  10/28/2022 139 134 - 144 mmol/L Final         Passed - Last BP in normal range    BP Readings from Last 1 Encounters:  10/28/22 122/70         Passed - Valid encounter within last 6 months    Recent Outpatient  Visits           5 months ago Essential hypertension   [place] [facility]   7 months ago Dense breasts   [facility]   12 months ago Essential hypertension   [place] [facility]   1 year ago Urethritis   [facility]   1 year ago Essential hypertension   [place] [facility]       Future Appointments             In 6 days Duanne Limerick, MD [facility] at [facility], [facility]  amLODipine (NORVASC) 5 MG tablet [Pharmacy Med Name: AMLODIPINE BESYLATE TABS 5MG ] 90 tablet 3    Sig: TAKE 1 TABLET DAILY     Cardiovascular: Calcium Channel Blockers 2 Passed - 04/22/2023 12:10 AM      Passed - Last BP in normal range    BP Readings from Last 1 Encounters:  10/28/22 122/70         Passed - Last Heart Rate in normal range    Pulse Readings from Last 1 Encounters:  10/28/22 72         Passed - Valid encounter within last 6 months    Recent Outpatient Visits           5 months ago Essential hypertension   [place] [facility]   7 months ago Dense breasts   [facility]   12 months ago Essential hypertension   [place] [facility]   1 year ago Urethritis   [facility]   1 year ago Essential hypertension   [place] [facility]       Future Appointments             In 6 days Duanne Limerick, MD [facility] at [facility], [facility]

## 2023-04-26 ENCOUNTER — Other Ambulatory Visit: Payer: Self-pay | Admitting: Family Medicine

## 2023-04-26 DIAGNOSIS — I10 Essential (primary) hypertension: Secondary | ICD-10-CM

## 2023-04-26 NOTE — Telephone Encounter (Signed)
Requested Prescriptions  Pending Prescriptions Disp Refills   lisinopril (ZESTRIL) 10 MG tablet [Pharmacy Med Name: LISINOPRIL TABS 10MG ] 90 tablet 0    Sig: TAKE 1 TABLET DAILY     Cardiovascular:  ACE Inhibitors Passed - 04/26/2023  1:35 AM      Passed - Cr in normal range and within 180 days    Creatinine, Ser  Date Value Ref Range Status  10/28/2022 0.80 0.57 - 1.00 mg/dL Final         Passed - K in normal range and within 180 days    Potassium  Date Value Ref Range Status  10/28/2022 4.7 3.5 - 5.2 mmol/L Final         Passed - Patient is not pregnant      Passed - Last BP in normal range    BP Readings from Last 1 Encounters:  10/28/22 122/70         Passed - Valid encounter within last 6 months    Recent Outpatient Visits           6 months ago Essential hypertension   [place] [facility]   7 months ago Dense breasts   [facility]   12 months ago Essential hypertension   [place] [facility]   1 year ago Urethritis   [facility]   1 year ago Essential hypertension   [place] [facility]       Future Appointments             In 2 days Duanne Limerick, MD [facility] at [facility], [facility]

## 2023-04-28 ENCOUNTER — Encounter: Payer: Self-pay | Admitting: Family Medicine

## 2023-04-28 ENCOUNTER — Ambulatory Visit (INDEPENDENT_AMBULATORY_CARE_PROVIDER_SITE_OTHER): Payer: Medicare Other | Admitting: Family Medicine

## 2023-04-28 VITALS — BP 120/70 | HR 76 | Ht 65.0 in | Wt 164.0 lb

## 2023-04-28 DIAGNOSIS — I10 Essential (primary) hypertension: Secondary | ICD-10-CM | POA: Diagnosis not present

## 2023-04-28 DIAGNOSIS — G43109 Migraine with aura, not intractable, without status migrainosus: Secondary | ICD-10-CM

## 2023-04-28 DIAGNOSIS — R110 Nausea: Secondary | ICD-10-CM | POA: Diagnosis not present

## 2023-04-28 DIAGNOSIS — K219 Gastro-esophageal reflux disease without esophagitis: Secondary | ICD-10-CM | POA: Diagnosis not present

## 2023-04-28 DIAGNOSIS — E785 Hyperlipidemia, unspecified: Secondary | ICD-10-CM | POA: Diagnosis not present

## 2023-04-28 DIAGNOSIS — J301 Allergic rhinitis due to pollen: Secondary | ICD-10-CM

## 2023-04-28 DIAGNOSIS — J0100 Acute maxillary sinusitis, unspecified: Secondary | ICD-10-CM | POA: Diagnosis not present

## 2023-04-28 MED ORDER — AZITHROMYCIN 250 MG PO TABS
ORAL_TABLET | ORAL | 0 refills | Status: AC
Start: 2023-04-28 — End: 2023-05-03

## 2023-04-28 MED ORDER — METOPROLOL SUCCINATE ER 50 MG PO TB24: 75.0000 mg | ORAL_TABLET | Freq: Every day | ORAL | 1 refills | Status: DC

## 2023-04-28 MED ORDER — PANTOPRAZOLE SODIUM 40 MG PO TBEC: 40.0000 mg | DELAYED_RELEASE_TABLET | Freq: Every day | ORAL | 1 refills | Status: DC

## 2023-04-28 MED ORDER — MONTELUKAST SODIUM 10 MG PO TABS: 10.0000 mg | ORAL_TABLET | Freq: Every day | ORAL | 1 refills | Status: DC

## 2023-04-28 MED ORDER — ONDANSETRON HCL 4 MG PO TABS
4.0000 mg | ORAL_TABLET | Freq: Three times a day (TID) | ORAL | 2 refills | Status: DC | PRN
Start: 2023-04-28 — End: 2023-09-30

## 2023-04-28 MED ORDER — FLUTICASONE PROPIONATE 50 MCG/ACT NA SUSP: 1.0000 | Freq: Every day | NASAL | 1 refills | Status: DC

## 2023-04-28 MED ORDER — ROSUVASTATIN CALCIUM 20 MG PO TABS: 20.0000 mg | ORAL_TABLET | Freq: Every day | ORAL | 1 refills | Status: DC

## 2023-04-28 MED ORDER — LISINOPRIL 10 MG PO TABS: 10.0000 mg | ORAL_TABLET | Freq: Every day | ORAL | 0 refills | Status: DC

## 2023-04-28 MED ORDER — HYDROCHLOROTHIAZIDE 25 MG PO TABS: 25.0000 mg | ORAL_TABLET | Freq: Every morning | ORAL | 1 refills | Status: DC

## 2023-04-28 MED ORDER — AMLODIPINE BESYLATE 5 MG PO TABS: 5.0000 mg | ORAL_TABLET | Freq: Every day | ORAL | 1 refills | Status: DC

## 2023-04-28 MED ORDER — SUMATRIPTAN SUCCINATE 50 MG PO TABS: 50.0000 mg | ORAL_TABLET | ORAL | 1 refills | Status: DC | PRN

## 2023-04-28 NOTE — Progress Notes (Signed)
Date:  04/28/2023   Name:  Nicole Cook   DOB:  Nov 09, 1951   MRN:  [id number]   Chief Complaint: Hypertension, Allergic Rhinitis , Headache, Gastroesophageal Reflux, and Hyperlipidemia  Hypertension This is a chronic problem. The current episode started more than 1 year ago. The problem has been gradually improving since onset. Associated symptoms include headaches. Pertinent negatives include no blurred vision, chest pain, orthopnea, palpitations, peripheral edema or shortness of breath. There are no associated agents to hypertension. Past treatments include ACE inhibitors, diuretics, beta blockers and calcium channel blockers. The current treatment provides moderate improvement. There are no compliance problems.  There is no history of CAD/MI or CVA. There is no history of chronic renal disease, a hypertension causing med or renovascular disease.  Headache  This is a chronic problem. The current episode started more than 1 year ago (variable). The pain is located in the Right unilateral region. Radiates to: crown of head. Associated symptoms include nausea. Pertinent negatives include no abdominal pain, blurred vision, hearing loss, photophobia, rhinorrhea, sinus pressure or sore throat. Associated symptoms comments: Aura sometimes. The symptoms are aggravated by bright light. She has tried triptans for the symptoms. The treatment provided moderate relief. Her past medical history is significant for hypertension. There is no history of obesity.  Gastroesophageal Reflux She complains of nausea. She reports no abdominal pain, no chest pain, no dysphagia, no heartburn, no sore throat or no wheezing. The current episode started in the past 7 days. The problem occurs constantly. The problem has been gradually improving. The symptoms are aggravated by certain foods. She has tried a PPI for the symptoms. The treatment provided moderate relief.  Hyperlipidemia This is a chronic problem. The current  episode started more than 1 year ago. The problem is controlled. Recent lipid tests were reviewed and are normal. She has no history of chronic renal disease, diabetes, hypothyroidism or obesity. Pertinent negatives include no chest pain or shortness of breath. Current antihyperlipidemic treatment includes statins. The current treatment provides moderate improvement of lipids. There are no compliance problems.  There are no known risk factors for coronary artery disease.  URI  This is a chronic problem. The current episode started more than 1 year ago. The problem has been gradually improving. There has been no fever. Associated symptoms include headaches and nausea. Pertinent negatives include no abdominal pain, chest pain, congestion, rhinorrhea, sinus pain, sore throat or wheezing. Associated symptoms comments: Purulent nasal discharge. She has tried antihistamine for the symptoms.    Lab Results  Component Value Date   NA 139 10/28/2022   K 4.7 10/28/2022   CO2 25 10/28/2022   GLUCOSE 160 (H) 10/28/2022   BUN 11 10/28/2022   CREATININE 0.80 10/28/2022   CALCIUM 9.6 10/28/2022   EGFR 78 10/28/2022   GFRNONAA 74 11/21/2020   Lab Results  Component Value Date   CHOL 127 10/28/2022   HDL 39 (L) 10/28/2022   LDLCALC 58 10/28/2022   TRIG 182 (H) 10/28/2022   CHOLHDL 3.3 03/09/2018   No results found for: "TSH" Lab Results  Component Value Date   HGBA1C 7.6 09/28/2022   Lab Results  Component Value Date   WBC 5.0 10/10/2018   HGB 12.4 10/10/2018   HCT 37.2 10/10/2018   MCV 86 10/10/2018   PLT 185 10/10/2018   Lab Results  Component Value Date   ALT 22 10/28/2022   AST 23 10/28/2022   ALKPHOS 50 10/28/2022   BILITOT 0.3 10/28/2022  No results found for: "25OHVITD2", "25OHVITD3", "VD25OH"   Review of Systems  HENT:  Negative for congestion, hearing loss, mouth sores, postnasal drip, rhinorrhea, sinus pressure, sinus pain and sore throat.   Eyes:  Negative for blurred  vision and photophobia.  Respiratory:  Negative for chest tightness, shortness of breath and wheezing.   Cardiovascular:  Negative for chest pain, palpitations, orthopnea and leg swelling.  Gastrointestinal:  Positive for nausea. Negative for abdominal pain, blood in stool, dysphagia and heartburn.  Genitourinary:  Negative for frequency and hematuria.  Neurological:  Positive for headaches.    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   Hyperlipidemia associated with type 2 diabetes mellitus (HCC) 12/25/2019   Atrophic vaginitis 11/23/2018   Pelvic floor relaxation 11/23/2018   Primary osteoarthritis of right knee 05/12/2018   Sacral back pain 08/04/2017   Taking medication for chronic disease 03/02/2017   Seasonal allergic rhinitis due to pollen 03/02/2017   Gastroesophageal reflux disease 03/02/2017   Migraine with aura and without status migrainosus, not intractable 03/02/2017   Angina pectoris (HCC) 11/09/2016   Chronic right-sided low back pain without sciatica 10/29/2016   Trochanteric bursitis of right hip 07/31/2015   Primary osteoarthritis of right hip 07/31/2015   Type 2 diabetes mellitus with hyperglycemia, with long-term current use of insulin (HCC) 04/03/2015   Hyperlipidemia 04/03/2015   Laboratory animal allergy 04/03/2015   Cephalalgia 04/03/2015   Essential hypertension 04/03/2015   Blood glucose elevated 04/03/2015   Routine general medical examination at a health care facility 04/03/2015   CAD (coronary artery disease) 04/03/2015   Dermatochalasis of both upper eyelids 09/20/2014   Sleep apnea 09/03/2014   DDD (degenerative disc disease), lumbosacral 03/03/2012   Spinal stenosis of lumbar region without neurogenic claudication 03/03/2012   Thoracic  or lumbosacral neuritis or radiculitis, unspecified 03/03/2012    Allergies  Allergen Reactions   Aspartame Other (See Comments)   Compazine [Prochlorperazine]    Penicillins    Sulfa Antibiotics    Sulfamethoxazole-Trimethoprim Hives and Other (See Comments)    Other Reaction: Not Assessed     Past Surgical History:  Procedure Laterality Date   back surgery x 2     c-section x 3     CESAREAN SECTION  1976, 1979, 1982   COLONOSCOPY  2011   cleared for 5 yrs- Dr Theodoro Parma   EYE SURGERY     eyelid lesion   KNEE ARTHROSCOPY Right 2010   SPINE SURGERY  2003,2006   TUBAL LIGATION  1982   VAGINAL HYSTERECTOMY  12/14/1994    Social History   Tobacco Use   Smoking status: Never   Smokeless tobacco: Never  Vaping Use   Vaping Use: Never used  Substance Use Topics   Alcohol use: No    Alcohol/week: 0.0 standard drinks of alcohol   Drug use: No     Medication list has been reviewed and updated.  Current Meds  Medication Sig   ACCU-CHEK FASTCLIX LANCETS MISC Accu-Chek Fastclix Lancet Drum   amLODipine (NORVASC) 5 MG tablet TAKE 1 TABLET DAILY   aspirin 81 MG tablet Take 1 tablet by mouth daily.   cetirizine (ZYRTEC) 10 MG tablet Take 10 mg by mouth daily. otc   Cholecalciferol (VITAMIN D3) 1000 units CAPS Take 1 capsule by mouth daily.  Coenzyme Q10 300 MG CAPS Take 1 capsule by mouth daily.   Dulaglutide 1.5 MG/0.5ML SOPN Inject 1 each into the skin once a week. endo .Marland KitchenSunday   estradiol (ESTRING) 2 MG vaginal ring Place 2 mg vaginally every 3 (three) months. follow package directions- GYN   fluticasone (FLONASE) 50 MCG/ACT nasal spray Place 1 spray into both nostrils daily.   gabapentin (NEURONTIN) 300 MG capsule Take 2 capsules by mouth 3 (three) times daily. [facility]/ pain doc may take 3 capsules 3 times per day per pt   hydrochlorothiazide (HYDRODIURIL) 25 MG tablet TAKE 1 TABLET EVERY MORNING   HYDROcodone-acetaminophen (NORCO) 10-325 MG per tablet Take 1 tablet by  mouth as needed. [facility] Gets 40 tabs monthly   insulin lispro (HUMALOG) 100 UNIT/ML KwikPen    lidocaine (LIDODERM) 5 % 1 patch daily. [facility]   lisinopril (ZESTRIL) 10 MG tablet TAKE 1 TABLET DAILY   Magnesium 500 MG CAPS Take 1 capsule by mouth daily.   metFORMIN (GLUCOPHAGE) 1000 MG tablet 1 tablet 2 (two) times daily.   metoprolol succinate (TOPROL-XL) 50 MG 24 hr tablet Take 1.5 tablets (75 mg total) by mouth daily. Take 1 and 1/2 tabs daily   montelukast (SINGULAIR) 10 MG tablet TAKE 1 TABLET AT BEDTIME   Multiple Vitamins-Minerals (CENTRUM SILVER ULTRA WOMENS PO) Take 1 tablet by mouth daily.   nitroGLYCERIN (NITROSTAT) 0.4 MG SL tablet Place 1 tablet under the tongue as needed.   olopatadine (PATANOL) 0.1 % ophthalmic solution Place 1 drop into both eyes 2 (two) times daily.   ondansetron (ZOFRAN) 4 MG tablet Take 1 tablet (4 mg total) by mouth every 8 (eight) hours as needed for nausea or vomiting.   ONE TOUCH ULTRA TEST test strip    OXYCONTIN 10 MG 12 hr tablet Take 10 mg by mouth 2 (two) times daily. [facility] Dr   pantoprazole (PROTONIX) 40 MG tablet Take 1 tablet (40 mg total) by mouth 2 (two) times daily. (Patient taking differently: Take 40 mg by mouth daily.)   pioglitazone (ACTOS) 15 MG tablet Take 15 mg by mouth daily. endo   rosuvastatin (CRESTOR) 20 MG tablet Take 1 tablet (20 mg total) by mouth daily.   SUMAtriptan (IMITREX) 50 MG tablet Take 1 tablet (50 mg total) by mouth as needed. Per 90 days   tiZANidine (ZANAFLEX) 2 MG tablet Take 2-4 mg by mouth at bedtime as needed. [facility]   vitamin B-12 (CYANOCOBALAMIN) 1000 MCG tablet Take 1,000 mcg by mouth daily.       04/28/2023   10:22 AM 10/28/2022    9:57 AM 09/21/2022    2:58 PM 04/27/2022    9:38 AM  GAD 7 : Generalized Anxiety Score  Nervous, Anxious, on Edge 0 0 0 0  Control/stop worrying 0 0 0 0  Worry too much - different things 0 0 0 0  Trouble relaxing 0 0 0 0  Restless 0 0 0 0   Easily annoyed or irritable 0 0 0 0  Afraid - awful might happen 0 0 0 0  Total GAD 7 Score 0 0 0 0  Anxiety Difficulty Not difficult at all Not difficult at all Not difficult at all Not difficult at all       04/28/2023   10:22 AM 01/28/2023    1:36 PM 10/28/2022    9:57 AM  Depression screen PHQ 2/9  Decreased Interest 0 0 0  Down, Depressed, Hopeless 0 0 0  PHQ -  2 Score 0 0 0  Altered sleeping 0 0 0  Tired, decreased energy 0 0 0  Change in appetite 0 0 0  Feeling bad or failure about yourself  0 0 0  Trouble concentrating 0 0 0  Moving slowly or fidgety/restless 0 0 0  Suicidal thoughts 0 0 0  PHQ-9 Score 0 0 0  Difficult doing work/chores Not difficult at all Not difficult at all Not difficult at all    BP Readings from Last 3 Encounters:  04/28/23 120/70  10/28/22 122/70  09/21/22 128/78    Physical Exam Vitals and nursing note reviewed. Exam conducted with a chaperone present.  Constitutional:      General: She is not in acute distress.    Appearance: She is not diaphoretic.  HENT:     Head: Normocephalic and atraumatic.     Right Ear: External ear normal.     Left Ear: External ear normal.     Nose:     Right Sinus: Maxillary sinus tenderness present. No frontal sinus tenderness.     Left Sinus: No maxillary sinus tenderness or frontal sinus tenderness.     Mouth/Throat:     Lips: Pink.     Mouth: Mucous membranes are moist.     Dentition: No dental tenderness.     Tongue: No lesions. Tongue does not deviate from midline.     Palate: No mass and lesions.     Pharynx: Oropharynx is clear.     Tonsils: No tonsillar exudate.  Eyes:     General:        Right eye: No discharge.        Left eye: No discharge.     Conjunctiva/sclera: Conjunctivae normal.     Pupils: Pupils are equal, round, and reactive to light.  Neck:     Thyroid: No thyromegaly.     Vascular: No JVD.  Cardiovascular:     Rate and Rhythm: Normal rate and regular rhythm.     Heart  sounds: Normal heart sounds. No murmur heard.    No friction rub. No gallop.  Pulmonary:     Effort: Pulmonary effort is normal.     Breath sounds: Normal breath sounds.  Abdominal:     General: Bowel sounds are normal.     Palpations: Abdomen is soft. There is no mass.     Tenderness: There is no abdominal tenderness. There is no guarding.  Musculoskeletal:        General: Normal range of motion.     Cervical back: Normal range of motion and neck supple.  Lymphadenopathy:     Cervical: No cervical adenopathy.  Skin:    General: Skin is warm and dry.  Neurological:     Mental Status: She is alert.     Deep Tendon Reflexes: Reflexes are normal and symmetric.     Wt Readings from Last 3 Encounters:  04/28/23 164 lb (74.4 kg)  01/28/23 160 lb (72.6 kg)  10/28/22 160 lb (72.6 kg)    BP 120/70   Pulse 76   Ht 5\' 5"  (1.651 m)   Wt 164 lb (74.4 kg)   SpO2 97%   BMI 27.29 kg/m   Assessment and Plan: 1. Essential hypertension Chronic.  Controlled.  Stable.  Blood pressure today is 120/70.  Asymptomatic.  Tolerating medications well.  Will continue with the regimen of: Amlodipine 5 mg once a day, hydrochlorothiazide 25 mg once a day, lisinopril 10 mg once a day, and  metoprolol XL 50 mg 1-1/2 tablets daily.  Will check renal function panel for GFR and electrolytes. - amLODipine (NORVASC) 5 MG tablet; Take 1 tablet (5 mg total) by mouth daily.  Dispense: 90 tablet; Refill: 1 - hydrochlorothiazide (HYDRODIURIL) 25 MG tablet; Take 1 tablet (25 mg total) by mouth every morning.  Dispense: 90 tablet; Refill: 1 - lisinopril (ZESTRIL) 10 MG tablet; Take 1 tablet (10 mg total) by mouth daily.  Dispense: 90 tablet; Refill: 0 - metoprolol succinate (TOPROL-XL) 50 MG 24 hr tablet; Take 1.5 tablets (75 mg total) by mouth daily. Take 1 and 1/2 tabs daily  Dispense: 135 tablet; Refill: 1 - Renal Function Panel  2. Seasonal allergic rhinitis due to pollen Chronic.  Controlled.  Stable.  Resume  Flonase as well as continuance of Singulair 10 mg once a day. - fluticasone (FLONASE) 50 MCG/ACT nasal spray; Place 1 spray into both nostrils daily.  Dispense: 16 g; Refill: 1 - montelukast (SINGULAIR) 10 MG tablet; Take 1 tablet (10 mg total) by mouth at bedtime.  Dispense: 90 tablet; Refill: 1  3. Gastroesophageal reflux disease, unspecified whether esophagitis present Chronic.  Controlled.  Stable.  Continue pantoprazole 40 mg once a day. - pantoprazole (PROTONIX) 40 MG tablet; Take 1 tablet (40 mg total) by mouth daily.  Dispense: 90 tablet; Refill: 1  4. Hyperlipidemia, unspecified hyperlipidemia type .  Controlled.  Stable.  Continue rosuvastatin 20 mg once a day.  Will check lipid panel for current level of lipid control. - rosuvastatin (CRESTOR) 20 MG tablet; Take 1 tablet (20 mg total) by mouth daily.  Dispense: 90 tablet; Refill: 1 - Lipid Panel With LDL/HDL Ratio  5. Migraine with aura and without status migrainosus, not intractable Chronic.  Controlled.  Stable.  Patient will continue sumatriptan on a as needed basis as well as Zofran for as needed nausea. - SUMAtriptan (IMITREX) 50 MG tablet; Take 1 tablet (50 mg total) by mouth as needed. Per 90 days  Dispense: 10 tablet; Refill: 1 - ondansetron (ZOFRAN) 4 MG tablet; Take 1 tablet (4 mg total) by mouth every 8 (eight) hours as needed for nausea or vomiting.  Dispense: 20 tablet; Refill: 2  6. Nausea Just usually associated with migraines patient has Cialis on an needed basis. - ondansetron (ZOFRAN) 4 MG tablet; Take 1 tablet (4 mg total) by mouth every 8 (eight) hours as needed for nausea or vomiting.  Dispense: 20 tablet; Refill: 2  7. Acute maxillary sinusitis, recurrence not specified New onset.  Persistent.  Stable.  History and examination is consistent with a right maxillary sinusitis with tenderness in this area.  Will initiate azithromycin to 50 mg 2 today followed by 1 a day for 4 days. - azithromycin (ZITHROMAX) 250  MG tablet; Take 2 tablets on day 1, then 1 tablet daily on days 2 through 5  Dispense: 6 tablet; Refill: 0     Elizabeth Sauer, MD

## 2023-04-29 LAB — LIPID PANEL WITH LDL/HDL RATIO
Cholesterol, Total: 124 mg/dL (ref 100–199)
HDL: 42 mg/dL (ref >39)
LDL Chol Calc (NIH): 57 mg/dL (ref 0–99)
LDL/HDL Ratio: 1.4 ratio (ref 0.0–3.2)
Triglycerides: 146 mg/dL (ref 0–149)
VLDL Cholesterol Cal: 25 mg/dL (ref 5–40)

## 2023-04-29 LAB — RENAL FUNCTION PANEL
Albumin: 4.5 g/dL (ref 3.8–4.8)
BUN/Creatinine Ratio: 15 (ref 12–28)
BUN: 13 mg/dL (ref 8–27)
CO2: 26 mmol/L (ref 20–29)
Calcium: 9.7 mg/dL (ref 8.7–10.3)
Chloride: 100 mmol/L (ref 96–106)
Creatinine, Ser: 0.89 mg/dL (ref 0.57–1.00)
Glucose: 143 mg/dL — ABNORMAL HIGH (ref 70–99)
Phosphorus: 3.6 mg/dL (ref 3.0–4.3)
Potassium: 4.8 mmol/L (ref 3.5–5.2)
Sodium: 139 mmol/L (ref 134–144)
eGFR: 68 mL/min/{1.73_m2} (ref >59)

## 2023-05-19 DIAGNOSIS — E1142 Type 2 diabetes mellitus with diabetic polyneuropathy: Secondary | ICD-10-CM | POA: Diagnosis not present

## 2023-05-20 DIAGNOSIS — M1711 Unilateral primary osteoarthritis, right knee: Secondary | ICD-10-CM | POA: Diagnosis not present

## 2023-05-20 DIAGNOSIS — M1712 Unilateral primary osteoarthritis, left knee: Secondary | ICD-10-CM | POA: Diagnosis not present

## 2023-05-27 DIAGNOSIS — M1711 Unilateral primary osteoarthritis, right knee: Secondary | ICD-10-CM | POA: Diagnosis not present

## 2023-05-31 DIAGNOSIS — I152 Hypertension secondary to endocrine disorders: Secondary | ICD-10-CM | POA: Diagnosis not present

## 2023-05-31 DIAGNOSIS — E1142 Type 2 diabetes mellitus with diabetic polyneuropathy: Secondary | ICD-10-CM | POA: Diagnosis not present

## 2023-05-31 DIAGNOSIS — E1159 Type 2 diabetes mellitus with other circulatory complications: Secondary | ICD-10-CM | POA: Diagnosis not present

## 2023-05-31 DIAGNOSIS — E785 Hyperlipidemia, unspecified: Secondary | ICD-10-CM | POA: Diagnosis not present

## 2023-05-31 DIAGNOSIS — E1169 Type 2 diabetes mellitus with other specified complication: Secondary | ICD-10-CM | POA: Diagnosis not present

## 2023-07-26 ENCOUNTER — Other Ambulatory Visit: Payer: Self-pay | Admitting: Family Medicine

## 2023-07-26 DIAGNOSIS — I10 Essential (primary) hypertension: Secondary | ICD-10-CM

## 2023-07-27 NOTE — Telephone Encounter (Signed)
Requested Prescriptions  Pending Prescriptions Disp Refills   lisinopril (ZESTRIL) 10 MG tablet [Pharmacy Med Name: LISINOPRIL TABS 10MG ] 90 tablet 0    Sig: TAKE 1 TABLET DAILY     Cardiovascular:  ACE Inhibitors Passed - 07/26/2023 12:12 AM      Passed - Cr in normal range and within 180 days    Creatinine, Ser  Date Value Ref Range Status  04/28/2023 0.89 0.57 - 1.00 mg/dL Final         Passed - K in normal range and within 180 days    Potassium  Date Value Ref Range Status  04/28/2023 4.8 3.5 - 5.2 mmol/L Final         Passed - Patient is not pregnant      Passed - Last BP in normal range    BP Readings from Last 1 Encounters:  04/28/23 120/70         Passed - Valid encounter within last 6 months    Recent Outpatient Visits           3 months ago Essential hypertension   [place] [facility]   9 months ago Essential hypertension   [place] [facility]   10 months ago Dense breasts   [facility]   1 year ago Essential hypertension   [place] [facility]   1 year ago Urethritis   [facility]       Future Appointments             In 3 months Duanne Limerick, MD [facility] at [facility], [facility]

## 2023-08-26 DIAGNOSIS — E1142 Type 2 diabetes mellitus with diabetic polyneuropathy: Secondary | ICD-10-CM | POA: Diagnosis not present

## 2023-08-28 DIAGNOSIS — M1712 Unilateral primary osteoarthritis, left knee: Secondary | ICD-10-CM | POA: Diagnosis not present

## 2023-09-01 DIAGNOSIS — M1711 Unilateral primary osteoarthritis, right knee: Secondary | ICD-10-CM | POA: Diagnosis not present

## 2023-09-21 DIAGNOSIS — Z1331 Encounter for screening for depression: Secondary | ICD-10-CM | POA: Diagnosis not present

## 2023-09-21 DIAGNOSIS — F5231 Female orgasmic disorder: Secondary | ICD-10-CM | POA: Diagnosis not present

## 2023-09-21 DIAGNOSIS — N819 Female genital prolapse, unspecified: Secondary | ICD-10-CM | POA: Diagnosis not present

## 2023-09-21 DIAGNOSIS — N952 Postmenopausal atrophic vaginitis: Secondary | ICD-10-CM | POA: Diagnosis not present

## 2023-09-30 ENCOUNTER — Other Ambulatory Visit: Payer: Self-pay | Admitting: Family Medicine

## 2023-09-30 DIAGNOSIS — G43109 Migraine with aura, not intractable, without status migrainosus: Secondary | ICD-10-CM

## 2023-09-30 DIAGNOSIS — R110 Nausea: Secondary | ICD-10-CM

## 2023-09-30 NOTE — Telephone Encounter (Signed)
Requested medication (s) are due for refill today:yes  Requested medication (s) are on the active medication list: yes  Last refill:  04/28/23 20 tab 2 RF  Future visit scheduled: yes  Notes to clinic:  med not delegated to NT to RF   Requested Prescriptions  Pending Prescriptions Disp Refills   ondansetron (ZOFRAN) 4 MG tablet [Pharmacy Med Name: ONDANSETRON TABS 4MG ] 20 tablet 51    Sig: TAKE 1 TABLET EVERY 8 HOURS AS NEEDED FOR NAUSEA OR VOMITING     Not Delegated - Gastroenterology: Antiemetics - ondansetron Failed - 09/30/2023  1:24 PM      Failed - This refill cannot be delegated      Passed - AST in normal range and within 360 days    AST  Date Value Ref Range Status  10/28/2022 23 0 - 40 IU/L Final         Passed - ALT in normal range and within 360 days    ALT  Date Value Ref Range Status  10/28/2022 22 0 - 32 IU/L Final         Passed - Valid encounter within last 6 months    Recent Outpatient Visits           5 months ago Essential hypertension   [place] [facility]   11 months ago Essential hypertension   [place] [facility]   1 year ago Dense breasts   [place] [facility]   1 year ago Essential hypertension   [place] [facility]   1 year ago Urethritis   [facility]       Future Appointments             In 4 weeks Duanne Limerick, MD [facility] at [facility], [facility]

## 2023-10-19 ENCOUNTER — Other Ambulatory Visit: Payer: Self-pay | Admitting: Family Medicine

## 2023-10-19 DIAGNOSIS — J301 Allergic rhinitis due to pollen: Secondary | ICD-10-CM

## 2023-10-19 DIAGNOSIS — I10 Essential (primary) hypertension: Secondary | ICD-10-CM

## 2023-10-20 DIAGNOSIS — M48062 Spinal stenosis, lumbar region with neurogenic claudication: Secondary | ICD-10-CM | POA: Diagnosis not present

## 2023-10-20 DIAGNOSIS — M545 Low back pain, unspecified: Secondary | ICD-10-CM | POA: Diagnosis not present

## 2023-10-20 DIAGNOSIS — M79605 Pain in left leg: Secondary | ICD-10-CM | POA: Diagnosis not present

## 2023-10-25 ENCOUNTER — Other Ambulatory Visit: Payer: Self-pay | Admitting: Family Medicine

## 2023-10-25 DIAGNOSIS — I10 Essential (primary) hypertension: Secondary | ICD-10-CM

## 2023-10-26 DIAGNOSIS — Z23 Encounter for immunization: Secondary | ICD-10-CM | POA: Diagnosis not present

## 2023-10-29 ENCOUNTER — Ambulatory Visit (INDEPENDENT_AMBULATORY_CARE_PROVIDER_SITE_OTHER): Payer: Medicare Other | Admitting: Family Medicine

## 2023-10-29 ENCOUNTER — Encounter: Payer: Self-pay | Admitting: Family Medicine

## 2023-10-29 VITALS — BP 100/62 | HR 79 | Ht 65.0 in | Wt 154.0 lb

## 2023-10-29 DIAGNOSIS — R1032 Left lower quadrant pain: Secondary | ICD-10-CM

## 2023-10-29 DIAGNOSIS — K219 Gastro-esophageal reflux disease without esophagitis: Secondary | ICD-10-CM

## 2023-10-29 DIAGNOSIS — E785 Hyperlipidemia, unspecified: Secondary | ICD-10-CM | POA: Diagnosis not present

## 2023-10-29 DIAGNOSIS — Z1211 Encounter for screening for malignant neoplasm of colon: Secondary | ICD-10-CM | POA: Diagnosis not present

## 2023-10-29 DIAGNOSIS — R110 Nausea: Secondary | ICD-10-CM | POA: Diagnosis not present

## 2023-10-29 DIAGNOSIS — J301 Allergic rhinitis due to pollen: Secondary | ICD-10-CM | POA: Diagnosis not present

## 2023-10-29 DIAGNOSIS — G43109 Migraine with aura, not intractable, without status migrainosus: Secondary | ICD-10-CM

## 2023-10-29 DIAGNOSIS — R1031 Right lower quadrant pain: Secondary | ICD-10-CM | POA: Diagnosis not present

## 2023-10-29 DIAGNOSIS — I10 Essential (primary) hypertension: Secondary | ICD-10-CM | POA: Diagnosis not present

## 2023-10-29 MED ORDER — ONDANSETRON HCL 4 MG PO TABS: 4.0000 mg | ORAL_TABLET | Freq: Three times a day (TID) | ORAL | 1 refills | Status: DC | PRN

## 2023-10-29 MED ORDER — SUMATRIPTAN SUCCINATE 50 MG PO TABS: 50.0000 mg | ORAL_TABLET | ORAL | 1 refills | Status: DC | PRN

## 2023-10-29 MED ORDER — AMLODIPINE BESYLATE 5 MG PO TABS: 5.0000 mg | ORAL_TABLET | Freq: Every day | ORAL | 1 refills | Status: DC

## 2023-10-29 MED ORDER — LISINOPRIL 10 MG PO TABS: 10.0000 mg | ORAL_TABLET | Freq: Every day | ORAL | 1 refills | Status: DC

## 2023-10-29 MED ORDER — PANTOPRAZOLE SODIUM 40 MG PO TBEC: 40.0000 mg | DELAYED_RELEASE_TABLET | Freq: Every day | ORAL | 1 refills | Status: DC

## 2023-10-29 MED ORDER — ROSUVASTATIN CALCIUM 20 MG PO TABS: 20.0000 mg | ORAL_TABLET | Freq: Every day | ORAL | 1 refills | Status: DC

## 2023-10-29 MED ORDER — HYDROCHLOROTHIAZIDE 25 MG PO TABS: 25.0000 mg | ORAL_TABLET | Freq: Every morning | ORAL | 1 refills | Status: DC

## 2023-10-29 MED ORDER — METOPROLOL SUCCINATE ER 50 MG PO TB24: 75.0000 mg | ORAL_TABLET | Freq: Every day | ORAL | 1 refills | Status: DC

## 2023-10-29 MED ORDER — MONTELUKAST SODIUM 10 MG PO TABS: 10.0000 mg | ORAL_TABLET | Freq: Every day | ORAL | 1 refills | Status: DC

## 2023-10-29 MED ORDER — FLUTICASONE PROPIONATE 50 MCG/ACT NA SUSP: 1.0000 | Freq: Every day | NASAL | 1 refills | Status: DC

## 2023-10-29 NOTE — Progress Notes (Signed)
Date:  10/29/2023   Name:  Nicole Cook   DOB:  Nov 27, 1951   MRN:  [id number]   Chief Complaint: No chief complaint on file.  Hypertension This is a chronic problem. The current episode started more than 1 year ago. The problem has been rapidly worsening since onset. The problem is controlled. Pertinent negatives include no anxiety, blurred vision, chest pain, headaches, malaise/fatigue, neck pain, orthopnea, palpitations, peripheral edema, PND, shortness of breath or sweats. There are no associated agents to hypertension. Past treatments include ACE inhibitors, diuretics, calcium channel blockers and beta blockers. The current treatment provides moderate improvement. There are no compliance problems.  There is no history of angina, kidney disease or CAD/MI. There is no history of chronic renal disease, a hypertension causing med or renovascular disease.  Hyperlipidemia This is a chronic problem. The current episode started more than 1 year ago. The problem is controlled. She has no history of chronic renal disease. Pertinent negatives include no chest pain, focal sensory loss, focal weakness, myalgias or shortness of breath. Current antihyperlipidemic treatment includes statins. The current treatment provides moderate improvement of lipids. There are no compliance problems.   Gastroesophageal Reflux She complains of abdominal pain. She reports no belching, no chest pain, no coughing, no heartburn, no nausea or no wheezing. This is a chronic problem. The symptoms are aggravated by certain foods. Associated symptoms include weight loss. Pertinent negatives include no fatigue or melena. She has tried a PPI for the symptoms. The treatment provided moderate relief.  URI  This is a chronic problem. The current episode started more than 1 year ago. The problem has been gradually improving. There has been no fever. Associated symptoms include abdominal pain. Pertinent negatives include no chest pain,  coughing, diarrhea, dysuria, headaches, nausea, neck pain, sinus pain, sneezing or wheezing.  Neurologic Problem The patient's pertinent negatives include no altered mental status, clumsiness, focal sensory loss, focal weakness, loss of balance, memory loss, near-syncope, slurred speech, syncope, visual change or weakness. This is a chronic problem. The current episode started more than 1 year ago. The problem has been gradually improving since onset. Associated symptoms include abdominal pain. Pertinent negatives include no back pain, chest pain, diaphoresis, fatigue, fever, headaches, nausea, neck pain, palpitations or shortness of breath.  Abdominal Pain This is a new problem. The most recent episode lasted 3 months. The pain is located in the RLQ. The pain is moderate. The quality of the pain is aching. Associated symptoms include weight loss. Pertinent negatives include no anorexia, belching, constipation, diarrhea, dysuria, fever, frequency, headaches, hematochezia, hematuria, melena, myalgias or nausea. Her past medical history is significant for GERD.    Lab Results  Component Value Date   NA 139 04/28/2023   K 4.8 04/28/2023   CO2 26 04/28/2023   GLUCOSE 143 (H) 04/28/2023   BUN 13 04/28/2023   CREATININE 0.89 04/28/2023   CALCIUM 9.7 04/28/2023   EGFR 68 04/28/2023   GFRNONAA 74 11/21/2020   Lab Results  Component Value Date   CHOL 124 04/28/2023   HDL 42 04/28/2023   LDLCALC 57 04/28/2023   TRIG 146 04/28/2023   CHOLHDL 3.3 03/09/2018   No results found for: "TSH" Lab Results  Component Value Date   HGBA1C 7.6 09/28/2022   Lab Results  Component Value Date   WBC 5.0 10/10/2018   HGB 12.4 10/10/2018   HCT 37.2 10/10/2018   MCV 86 10/10/2018   PLT 185 10/10/2018   Lab Results  Component Value Date   ALT 22 10/28/2022   AST 23 10/28/2022   ALKPHOS 50 10/28/2022   BILITOT 0.3 10/28/2022   No results found for: "25OHVITD2", "25OHVITD3", "VD25OH"   Review of  Systems  Constitutional:  Positive for weight loss. Negative for diaphoresis, fatigue, fever and malaise/fatigue.  HENT:  Negative for postnasal drip, sinus pain and sneezing.   Eyes:  Negative for blurred vision.  Respiratory:  Negative for cough, chest tightness, shortness of breath and wheezing.   Cardiovascular:  Negative for chest pain, palpitations, orthopnea, PND and near-syncope.  Gastrointestinal:  Positive for abdominal pain. Negative for abdominal distention, anorexia, constipation, diarrhea, heartburn, hematochezia, melena and nausea.  Endocrine: Negative for polydipsia and polyuria.  Genitourinary:  Negative for dysuria, frequency, hematuria and vaginal bleeding.  Musculoskeletal:  Negative for back pain, myalgias and neck pain.  Neurological:  Negative for focal weakness, syncope, weakness, headaches and loss of balance.  Psychiatric/Behavioral:  Negative for memory loss.     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   Hyperlipidemia associated with type 2 diabetes mellitus (HCC) 12/25/2019   Atrophic vaginitis 11/23/2018   Pelvic floor relaxation 11/23/2018   Primary osteoarthritis of right knee 05/12/2018   Sacral back pain 08/04/2017   Taking medication for chronic disease 03/02/2017   Seasonal allergic rhinitis due to pollen 03/02/2017   Gastroesophageal reflux disease 03/02/2017   Migraine with aura and without status migrainosus, not intractable 03/02/2017   Angina pectoris (HCC) 11/09/2016   Chronic right-sided low back pain without sciatica 10/29/2016   Trochanteric bursitis of right hip 07/31/2015   Primary osteoarthritis of right hip 07/31/2015   Type 2 diabetes mellitus with hyperglycemia, with long-term current use of insulin (HCC) 04/03/2015    Hyperlipidemia 04/03/2015   Laboratory animal allergy 04/03/2015   Cephalalgia 04/03/2015   Essential hypertension 04/03/2015   Blood glucose elevated 04/03/2015   Routine general medical examination at a health care facility 04/03/2015   CAD (coronary artery disease) 04/03/2015   Dermatochalasis of both upper eyelids 09/20/2014   Sleep apnea 09/03/2014   DDD (degenerative disc disease), lumbosacral 03/03/2012   Spinal stenosis of lumbar region without neurogenic claudication 03/03/2012   Thoracic or lumbosacral neuritis or radiculitis, unspecified 03/03/2012    Allergies  Allergen Reactions   Aspartame Other (See Comments)   Compazine [Prochlorperazine]    Penicillins    Sulfa Antibiotics    Sulfamethoxazole-Trimethoprim Hives and Other (See Comments)    Other Reaction: Not Assessed     Past Surgical History:  Procedure Laterality Date   back surgery x 2     c-section x 3     CESAREAN SECTION  1976, 1979, 1982   COLONOSCOPY  2011   cleared for 5 yrs- Dr Theodoro Parma   EYE SURGERY     eyelid lesion   KNEE ARTHROSCOPY Right 2010   SPINE SURGERY  2003,2006   TUBAL LIGATION  1982   VAGINAL HYSTERECTOMY  12/14/1994    Social History   Tobacco Use   Smoking status: Never   Smokeless tobacco: Never  Vaping Use   Vaping status: Never Used  Substance Use Topics   Alcohol use: No    Alcohol/week: 0.0 standard drinks of alcohol   Drug use: No     Medication list has been reviewed and  updated.  Current Meds  Medication Sig   ACCU-CHEK FASTCLIX LANCETS MISC Accu-Chek Fastclix Lancet Drum   amLODipine (NORVASC) 5 MG tablet TAKE 1 TABLET DAILY   aspirin 81 MG tablet Take 1 tablet by mouth daily.   cetirizine (ZYRTEC) 10 MG tablet Take 10 mg by mouth daily. otc   Cholecalciferol (VITAMIN D3) 1000 units CAPS Take 1 capsule by mouth daily.   Coenzyme Q10 300 MG CAPS Take 1 capsule by mouth daily.   Dulaglutide 3 MG/0.5ML SOAJ Inject into the skin once a week.  endo ..Sunday   DULoxetine (CYMBALTA) 30 MG capsule Take 30 mg by mouth daily.   estradiol (ESTRING) 2 MG vaginal ring Place 2 mg vaginally every 3 (three) months. follow package directions- GYN   fluticasone (FLONASE) 50 MCG/ACT nasal spray Place 1 spray into both nostrils daily.   gabapentin (NEURONTIN) 300 MG capsule Take 2 capsules by mouth 3 (three) times daily. [facility]/ pain doc may take 3 capsules 3 times per day per pt   hydrochlorothiazide (HYDRODIURIL) 25 MG tablet TAKE 1 TABLET EVERY MORNING   HYDROcodone-acetaminophen (NORCO) 10-325 MG per tablet Take 1 tablet by mouth as needed. [facility] Gets 40 tabs monthly   Insulin Aspart FlexPen (NOVOLOG) 100 UNIT/ML Up to 50 units per day after steroid shot based on blood sugars. Dx: E11.9   lidocaine (LIDODERM) 5 % 1 patch daily. [facility]   lisinopril (ZESTRIL) 10 MG tablet TAKE 1 TABLET DAILY   Magnesium 500 MG CAPS Take 1 capsule by mouth daily.   metFORMIN (GLUCOPHAGE) 1000 MG tablet 1 tablet 2 (two) times daily.   metoprolol succinate (TOPROL-XL) 50 MG 24 hr tablet Take 1.5 tablets (75 mg total) by mouth daily. Take 1 and 1/2 tabs daily   montelukast (SINGULAIR) 10 MG tablet TAKE 1 TABLET AT BEDTIME   Multiple Vitamins-Minerals (CENTRUM SILVER ULTRA WOMENS PO) Take 1 tablet by mouth daily.   naloxone (NARCAN) nasal spray 4 mg/0.1 mL AS DIRECTED NASALLY OPIATE OVERDOSE, MR IF NEEDED 30 DAYS   nitroGLYCERIN (NITROSTAT) 0.4 MG SL tablet Place 1 tablet under the tongue as needed.   olopatadine (PATANOL) 0.1 % ophthalmic solution Place 1 drop into both eyes 2 (two) times daily.   ondansetron (ZOFRAN) 4 MG tablet TAKE 1 TABLET EVERY 8 HOURS AS NEEDED FOR NAUSEA OR VOMITING   ONE TOUCH ULTRA TEST test strip    OXYCONTIN 10 MG 12 hr tablet Take 10 mg by mouth 2 (two) times daily. [facility] Dr   pantoprazole (PROTONIX) 40 MG tablet Take 1 tablet (40 mg total) by mouth daily.   rosuvastatin (CRESTOR) 20 MG tablet Take 1 tablet (20 mg  total) by mouth daily.   SUMAtriptan (IMITREX) 50 MG tablet Take 1 tablet (50 mg total) by mouth as needed. Per 90 days   tiZANidine (ZANAFLEX) 2 MG tablet Take 2-4 mg by mouth at bedtime as needed. [facility]   vitamin B-12 (CYANOCOBALAMIN) 1000 MCG tablet Take 1,000 mcg by mouth daily.       11 /15/2024   10:46 AM 04/28/2023   10:22 AM 10/28/2022    9:57 AM 09/21/2022    2:58 PM  GAD 7 : Generalized Anxiety Score  Nervous, Anxious, on Edge 0 0 0 0  Control/stop worrying 0 0 0 0  Worry too much - different things 0 0 0 0  Trouble relaxing 0 0 0 0  Restless 0 0 0 0  Easily annoyed or irritable 0 0 0 0  Afraid - awful might happen 0 0 0 0  Total GAD 7 Score 0 0 0 0  Anxiety Difficulty Not difficult at all Not difficult at all Not difficult at all Not difficult at all       10/29/2023   10:46 AM 04/28/2023   10:22 AM 01/28/2023    1:36 PM  Depression screen PHQ 2/9  Decreased Interest 0 0 0  Down, Depressed, Hopeless 0 0 0  PHQ - 2 Score 0 0 0  Altered sleeping 0 0 0  Tired, decreased energy 0 0 0  Change in appetite 0 0 0  Feeling bad or failure about yourself  0 0 0  Trouble concentrating 0 0 0  Moving slowly or fidgety/restless 0 0 0  Suicidal thoughts 0 0 0  PHQ-9 Score 0 0 0  Difficult doing work/chores Not difficult at all Not difficult at all Not difficult at all    BP Readings from Last 3 Encounters:  10/29/23 100/62  04/28/23 120/70  10/28/22 122/70    Physical Exam Vitals and nursing note reviewed. Exam conducted with a chaperone present.  Constitutional:      General: She is not in acute distress.    Appearance: She is not diaphoretic.  HENT:     Head: Normocephalic and atraumatic.     Right Ear: External ear normal.     Left Ear: External ear normal.     Nose: Nose normal.  Eyes:     General:        Right eye: No discharge.        Left eye: No discharge.     Conjunctiva/sclera: Conjunctivae normal.     Pupils: Pupils are equal, round, and  reactive to light.  Neck:     Thyroid: No thyromegaly.     Vascular: No JVD.  Cardiovascular:     Rate and Rhythm: Normal rate and regular rhythm.     Heart sounds: Normal heart sounds. No murmur heard.    No friction rub. No gallop.  Pulmonary:     Effort: Pulmonary effort is normal.     Breath sounds: Normal breath sounds. No wheezing, rhonchi or rales.  Abdominal:     General: Bowel sounds are normal.     Palpations: Abdomen is soft. There is no mass.     Tenderness: There is abdominal tenderness in the right lower quadrant. There is no right CVA tenderness, left CVA tenderness or guarding.  Musculoskeletal:        General: Normal range of motion.     Cervical back: Normal range of motion and neck supple.  Lymphadenopathy:     Cervical: No cervical adenopathy.  Skin:    General: Skin is warm and dry.  Neurological:     Mental Status: She is alert.     Deep Tendon Reflexes: Reflexes are normal and symmetric.     Wt Readings from Last 3 Encounters:  10/29/23 154 lb (69.9 kg)  04/28/23 164 lb (74.4 kg)  01/28/23 160 lb (72.6 kg)    BP 100/62   Pulse 79   Ht 5\' 5"  (1.651 m)   Wt 154 lb (69.9 kg)   SpO2 97%   BMI 25.63 kg/m   Assessment and Plan: 1. Essential hypertension Chronic.  Controlled.  Stable.  Blood pressure 100/62.  Asymptomatic.  Tolerating medications well.  Continue amlodipine 5 mg once a day, hydrochlorothiazide 25 mg once a day, lisinopril 10 mg once a day and metoprolol XL 50  mg 1-1/2 tablets once a day.  Will obtain renal function panel for electrolytes and GFR.  Will recheck patient in 6 months. - Renal Function Panel - amLODipine (NORVASC) 5 MG tablet; Take 1 tablet (5 mg total) by mouth daily.  Dispense: 90 tablet; Refill: 1 - hydrochlorothiazide (HYDRODIURIL) 25 MG tablet; Take 1 tablet (25 mg total) by mouth every morning.  Dispense: 90 tablet; Refill: 1 - lisinopril (ZESTRIL) 10 MG tablet; Take 1 tablet (10 mg total) by mouth daily.  Dispense: 90  tablet; Refill: 1 - metoprolol succinate (TOPROL-XL) 50 MG 24 hr tablet; Take 1.5 tablets (75 mg total) by mouth daily. Take 1 and 1/2 tabs daily  Dispense: 135 tablet; Refill: 1  2. Hyperlipidemia, unspecified hyperlipidemia type Chronic.  Controlled.  Stable.  Asymptomatic.  Tolerating rosuvastatin 20 mg once a day which will be continued as such.  Patient has had no issues with myalgias or muscle weakness. - rosuvastatin (CRESTOR) 20 MG tablet; Take 1 tablet (20 mg total) by mouth daily.  Dispense: 90 tablet; Refill: 1  3. Gastroesophageal reflux disease, unspecified whether esophagitis present Chronic.  Controlled.  Stable.  Continue pantoprazole 40 mg once a day.  Will recheck in 6 months. - pantoprazole (PROTONIX) 40 MG tablet; Take 1 tablet (40 mg total) by mouth daily.  Dispense: 90 tablet; Refill: 1  4. Migraine with aura and without status migrainosus, not intractable Chronic.  Stable.  Controlled.  Patient is currently treating migraines on an as presenting basis.  This is with Imitrex 50 mg as needed as well as Zofran 4 mg as needed nausea. - ondansetron (ZOFRAN) 4 MG tablet; Take 1 tablet (4 mg total) by mouth every 8 (eight) hours as needed for nausea or vomiting.  Dispense: 20 tablet; Refill: 1 - SUMAtriptan (IMITREX) 50 MG tablet; Take 1 tablet (50 mg total) by mouth as needed. Per 90 days  Dispense: 10 tablet; Refill: 1  5. Nausea As noted above nausea usually associated with migraines is treated with Zofran 4 mg every 8 hours. - ondansetron (ZOFRAN) 4 MG tablet; Take 1 tablet (4 mg total) by mouth every 8 (eight) hours as needed for nausea or vomiting.  Dispense: 20 tablet; Refill: 1  6. Seasonal allergic rhinitis due to pollen Chronic.  Episodic.  Stable.  Continue Flonase 50 mg nasal spray.  Patient will also continue with Singulair 10 mg once a day. - fluticasone (FLONASE) 50 MCG/ACT nasal spray; Place 1 spray into both nostrils daily.  Dispense: 16 g; Refill: 1 -  montelukast (SINGULAIR) 10 MG tablet; Take 1 tablet (10 mg total) by mouth at bedtime.  Dispense: 90 tablet; Refill: 1     7. Colon cancer screening Discussion with patient and patient used to be having colonoscopies with [facility] and [facility] [place].  She has been unable to contact them we will put in formal evaluation for colon cancer screening and proceed with evaluation of abdominal pain. - Ambulatory referral to Gastroenterology  8 Right lower quadrant abdominal pain.  New onset.  Persistent.  Pain has been present for at least 2 months in the right lower quadrant.  Pain is intermittent nonradiating.  Patient has been trying to get in touch with her gastroenterologist without success.  We will go ahead and pursue in this direction and that this has been going on for couple months and not seemingly worsening staying about the same we will obtain lab work including hepatic panel amylase CBC.  Pending results of this and patient's return and formal evaluation of this discomfort next week we will determine in which direction we go from a diagnostic standpoint.  Elizabeth Sauer, MD

## 2023-10-30 ENCOUNTER — Encounter: Payer: Self-pay | Admitting: Family Medicine

## 2023-10-30 LAB — RENAL FUNCTION PANEL
Albumin: 4.5 g/dL (ref 3.8–4.8)
BUN/Creatinine Ratio: 19 (ref 12–28)
BUN: 14 mg/dL (ref 8–27)
CO2: 26 mmol/L (ref 20–29)
Calcium: 9.4 mg/dL (ref 8.7–10.3)
Chloride: 99 mmol/L (ref 96–106)
Creatinine, Ser: 0.74 mg/dL (ref 0.57–1.00)
Glucose: 159 mg/dL — ABNORMAL HIGH (ref 70–99)
Phosphorus: 3.8 mg/dL (ref 3.0–4.3)
Potassium: 4.9 mmol/L (ref 3.5–5.2)
Sodium: 140 mmol/L (ref 134–144)
eGFR: 85 mL/min/{1.73_m2} (ref >59)

## 2023-10-30 LAB — CBC WITH DIFFERENTIAL/PLATELET
Basophils Absolute: 0 10*3/uL (ref 0.0–0.2)
Basos: 0 %
EOS (ABSOLUTE): 0.2 10*3/uL (ref 0.0–0.4)
Eos: 3 %
Hematocrit: 38.4 % (ref 34.0–46.6)
Hemoglobin: 12 g/dL (ref 11.1–15.9)
Immature Grans (Abs): 0 10*3/uL (ref 0.0–0.1)
Immature Granulocytes: 0 %
Lymphocytes Absolute: 2.2 10*3/uL (ref 0.7–3.1)
Lymphs: 33 %
MCH: 27 pg (ref 26.6–33.0)
MCHC: 31.3 g/dL — ABNORMAL LOW (ref 31.5–35.7)
MCV: 86 fL (ref 79–97)
Monocytes Absolute: 0.5 10*3/uL (ref 0.1–0.9)
Monocytes: 8 %
Neutrophils Absolute: 3.8 10*3/uL (ref 1.4–7.0)
Neutrophils: 56 %
Platelets: 210 10*3/uL (ref 150–450)
RBC: 4.45 x10E6/uL (ref 3.77–5.28)
RDW: 13.5 % (ref 11.7–15.4)
WBC: 6.7 10*3/uL (ref 3.4–10.8)

## 2023-10-30 LAB — AMYLASE: Amylase: 321 U/L — ABNORMAL HIGH (ref 31–110)

## 2023-10-30 LAB — HEPATIC FUNCTION PANEL (6)
ALT: 15 [IU]/L (ref 0–32)
AST: 14 [IU]/L (ref 0–40)
Alkaline Phosphatase: 50 [IU]/L (ref 44–121)
Bilirubin Total: 0.2 mg/dL (ref 0.0–1.2)
Bilirubin, Direct: 0.11 mg/dL (ref 0.00–0.40)

## 2023-11-01 ENCOUNTER — Other Ambulatory Visit: Payer: Self-pay

## 2023-11-01 DIAGNOSIS — E785 Hyperlipidemia, unspecified: Secondary | ICD-10-CM

## 2023-11-01 DIAGNOSIS — E1159 Type 2 diabetes mellitus with other circulatory complications: Secondary | ICD-10-CM | POA: Diagnosis not present

## 2023-11-01 DIAGNOSIS — E1169 Type 2 diabetes mellitus with other specified complication: Secondary | ICD-10-CM | POA: Diagnosis not present

## 2023-11-01 DIAGNOSIS — I152 Hypertension secondary to endocrine disorders: Secondary | ICD-10-CM | POA: Diagnosis not present

## 2023-11-01 DIAGNOSIS — E1142 Type 2 diabetes mellitus with diabetic polyneuropathy: Secondary | ICD-10-CM | POA: Diagnosis not present

## 2023-11-02 ENCOUNTER — Encounter: Payer: Self-pay | Admitting: Family Medicine

## 2023-11-02 DIAGNOSIS — I2510 Atherosclerotic heart disease of native coronary artery without angina pectoris: Secondary | ICD-10-CM | POA: Diagnosis not present

## 2023-11-02 DIAGNOSIS — E1159 Type 2 diabetes mellitus with other circulatory complications: Secondary | ICD-10-CM | POA: Diagnosis not present

## 2023-11-02 DIAGNOSIS — I152 Hypertension secondary to endocrine disorders: Secondary | ICD-10-CM | POA: Diagnosis not present

## 2023-11-02 LAB — LIPID PANEL WITH LDL/HDL RATIO
Cholesterol, Total: 148 mg/dL (ref 100–199)
HDL: 46 mg/dL (ref >39)
LDL Chol Calc (NIH): 84 mg/dL (ref 0–99)
LDL/HDL Ratio: 1.8 ratio (ref 0.0–3.2)
Triglycerides: 99 mg/dL (ref 0–149)
VLDL Cholesterol Cal: 18 mg/dL (ref 5–40)

## 2023-11-08 ENCOUNTER — Ambulatory Visit (INDEPENDENT_AMBULATORY_CARE_PROVIDER_SITE_OTHER): Payer: Medicare Other | Admitting: Family Medicine

## 2023-11-08 ENCOUNTER — Encounter: Payer: Self-pay | Admitting: Family Medicine

## 2023-11-08 VITALS — BP 102/70 | HR 75 | Ht 65.0 in | Wt 154.0 lb

## 2023-11-08 DIAGNOSIS — R1031 Right lower quadrant pain: Secondary | ICD-10-CM | POA: Diagnosis not present

## 2023-11-08 NOTE — Progress Notes (Signed)
Date:  11/08/2023   Name:  Nicole Cook   DOB:  [birth year]   MRN:  [id number]   Chief Complaint: Abdominal Pain (X2 months, unchanged, LRQ, dull, aching pain, sharp pain when touching)  Abdominal Pain This is a new problem. The current episode started more than 1 month ago. The onset quality is gradual. The problem has been unchanged. The pain is located in the RLQ. The pain is at a severity of 3/10 (pain with palpation). The pain is moderate. The quality of the pain is aching. The abdominal pain does not radiate. Associated symptoms include anorexia, constipation and nausea. Pertinent negatives include no diarrhea, dysuria, fever, flatus, hematochezia, hematuria, melena, myalgias, vomiting or weight loss. The pain is aggravated by bowel movement. The pain is relieved by Recumbency and vomiting. She has tried oral narcotic analgesics and acetaminophen (zofran) for the symptoms. Prior diagnostic workup includes CT scan and lower endoscopy. There is no history of abdominal surgery, colon cancer, Crohn's disease, irritable bowel syndrome or ulcerative colitis. history adhesion    Lab Results  Component Value Date   NA 140 10/29/2023   K 4.9 10/29/2023   CO2 26 10/29/2023   GLUCOSE 159 (H) 10/29/2023   BUN 14 10/29/2023   CREATININE 0.74 10/29/2023   CALCIUM 9.4 10/29/2023   EGFR 85 10/29/2023   GFRNONAA 74 11/21/2020   Lab Results  Component Value Date   CHOL 148 11/01/2023   HDL 46 11/01/2023   LDLCALC 84 11/01/2023   TRIG 99 11/01/2023   CHOLHDL 3.3 03/09/2018   No results found for: "TSH" Lab Results  Component Value Date   HGBA1C 7.6 09/28/2022   Lab Results  Component Value Date   WBC 6.7 10/29/2023   HGB 12.0 10/29/2023   HCT 38.4 10/29/2023   MCV 86 10/29/2023   PLT 210 10/29/2023   Lab Results  Component Value Date   ALT 15 10/29/2023   AST 14 10/29/2023   ALKPHOS 50 10/29/2023   BILITOT 0.2 10/29/2023   No results found for: "25OHVITD2", "25OHVITD3",  "VD25OH"   Review of Systems  Constitutional:  Positive for diaphoresis, fatigue and unexpected weight change. Negative for chills, fever and weight loss.  Eyes:  Negative for visual disturbance.  Respiratory:  Negative for stridor.   Cardiovascular:  Negative for chest pain and palpitations.  Gastrointestinal:  Positive for abdominal pain, anorexia, constipation and nausea. Negative for abdominal distention, anal bleeding, blood in stool, diarrhea, flatus, hematochezia, melena, rectal pain and vomiting.  Endocrine: Negative for polydipsia and polyuria.  Genitourinary:  Negative for difficulty urinating, dysuria, hematuria, pelvic pain and vaginal bleeding.  Musculoskeletal:  Negative for myalgias.    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   Hyperlipidemia associated with type 2 diabetes mellitus (HCC) 12/25/2019   Atrophic vaginitis 11/23/2018   Pelvic floor relaxation 11/23/2018   Primary osteoarthritis of right knee 05/12/2018   Sacral back pain 08/04/2017   Taking medication for chronic disease 03/02/2017   Seasonal allergic rhinitis due to pollen 03/02/2017   Gastroesophageal reflux disease 03/02/2017   Migraine with aura and without status migrainosus, not intractable 03/02/2017   Angina pectoris (HCC) 11/09/2016   Chronic right-sided low back pain without sciatica 10/29/2016   Trochanteric bursitis of right hip 07/31/2015   Primary osteoarthritis of  right hip 07/31/2015   Type 2 diabetes mellitus with hyperglycemia, with long-term current use of insulin (HCC) 04/03/2015   Hyperlipidemia 04/03/2015   Laboratory animal allergy 04/03/2015   Cephalalgia 04/03/2015   Essential hypertension 04/03/2015   Blood glucose elevated 04/03/2015   Routine general medical  examination at a health care facility 04/03/2015   CAD (coronary artery disease) 04/03/2015   Dermatochalasis of both upper eyelids 09/20/2014   Sleep apnea 09/03/2014   DDD (degenerative disc disease), lumbosacral 03/03/2012   Spinal stenosis of lumbar region without neurogenic claudication 03/03/2012   Thoracic or lumbosacral neuritis or radiculitis, unspecified 03/03/2012    Allergies  Allergen Reactions   Aspartame Other (See Comments)   Compazine [Prochlorperazine]    Penicillins    Sulfa Antibiotics    Sulfamethoxazole-Trimethoprim Hives and Other (See Comments)    Other Reaction: Not Assessed     Past Surgical History:  Procedure Laterality Date   back surgery x 2     c-section x 3     CESAREAN SECTION  1976, 1979, 1982   COLONOSCOPY  2011   cleared for 5 yrs- Dr Theodoro Parma   EYE SURGERY     eyelid lesion   KNEE ARTHROSCOPY Right 2010   SPINE SURGERY  2003,2006   TUBAL LIGATION  1982   VAGINAL HYSTERECTOMY  12/14/1994    Social History   Tobacco Use   Smoking status: Never   Smokeless tobacco: Never  Vaping Use   Vaping status: Never Used  Substance Use Topics   Alcohol use: No    Alcohol/week: 0.0 standard drinks of alcohol   Drug use: No     Medication list has been reviewed and updated.  Current Meds  Medication Sig   ACCU-CHEK FASTCLIX LANCETS MISC Accu-Chek Fastclix Lancet Drum   amLODipine (NORVASC) 5 MG tablet Take 1 tablet (5 mg total) by mouth daily.   aspirin 81 MG tablet Take 1 tablet by mouth daily.   cetirizine (ZYRTEC) 10 MG tablet Take 10 mg by mouth daily. otc   Cholecalciferol (VITAMIN D3) 1000 units CAPS Take 1 capsule by mouth daily.   Coenzyme Q10 300 MG CAPS Take 1 capsule by mouth daily.   DULoxetine (CYMBALTA) 30 MG capsule Take 30 mg by mouth daily.   estradiol (ESTRING) 2 MG vaginal ring Place 2 mg vaginally every 3 (three) months. follow package directions- GYN   fluticasone (FLONASE) 50 MCG/ACT nasal spray Place 1 spray  into both nostrils daily.   gabapentin (NEURONTIN) 300 MG capsule Take 2 capsules by mouth 3 (three) times daily. [facility]/ pain doc may take 3 capsules 3 times per day per pt   hydrochlorothiazide (HYDRODIURIL) 25 MG tablet Take 1 tablet (25 mg total) by mouth every morning.   HYDROcodone-acetaminophen (NORCO) 10-325 MG per tablet Take 1 tablet by mouth as needed. [facility] Gets 40 tabs monthly   Insulin Aspart FlexPen (NOVOLOG) 100 UNIT/ML Up to 50 units per day after steroid shot based on blood sugars. Dx: E11.9   insulin lispro (HUMALOG) 100 UNIT/ML KwikPen    lidocaine (LIDODERM) 5 % 1 patch daily. [facility]   lisinopril (ZESTRIL) 10 MG tablet Take 1 tablet (10 mg total) by mouth daily.   Magnesium 500 MG CAPS Take 1 capsule by mouth daily.   metFORMIN (GLUCOPHAGE) 1000 MG tablet 1 tablet 2 (two) times daily.   metoprolol succinate (TOPROL-XL) 50 MG 24 hr tablet Take 1.5 tablets (75 mg total) by mouth daily. Take 1 and 1/2  tabs daily   montelukast (SINGULAIR) 10 MG tablet Take 1 tablet (10 mg total) by mouth at bedtime.   Multiple Vitamins-Minerals (CENTRUM SILVER ULTRA WOMENS PO) Take 1 tablet by mouth daily.   naloxone (NARCAN) nasal spray 4 mg/0.1 mL AS DIRECTED NASALLY OPIATE OVERDOSE, MR IF NEEDED 30 DAYS   nitroGLYCERIN (NITROSTAT) 0.4 MG SL tablet Place 1 tablet under the tongue as needed.   olopatadine (PATANOL) 0.1 % ophthalmic solution Place 1 drop into both eyes 2 (two) times daily.   ondansetron (ZOFRAN) 4 MG tablet Take 1 tablet (4 mg total) by mouth every 8 (eight) hours as needed for nausea or vomiting.   ONE TOUCH ULTRA TEST test strip    OXYCONTIN 10 MG 12 hr tablet Take 10 mg by mouth 2 (two) times daily. [facility] Dr   pantoprazole (PROTONIX) 40 MG tablet Take 1 tablet (40 mg total) by mouth daily.   rosuvastatin (CRESTOR) 20 MG tablet Take 1 tablet (20 mg total) by mouth daily.   SUMAtriptan (IMITREX) 50 MG tablet Take 1 tablet (50 mg total) by mouth as needed.  Per 90 days   tiZANidine (ZANAFLEX) 2 MG tablet Take 2-4 mg by mouth at bedtime as needed. [facility]/ [facility]   vitamin B-12 (CYANOCOBALAMIN) 1000 MCG tablet Take 1,000 mcg by mouth daily.       11/08/2023   10:25 AM 10/29/2023   10:46 AM 04/28/2023   10:22 AM 10/28/2022    9:57 AM  GAD 7 : Generalized Anxiety Score  Nervous, Anxious, on Edge 0 0 0 0  Control/stop worrying 0 0 0 0  Worry too much - different things 0 0 0 0  Trouble relaxing 0 0 0 0  Restless 0 0 0 0  Easily annoyed or irritable 0 0 0 0  Afraid - awful might happen 0 0 0 0  Total GAD 7 Score 0 0 0 0  Anxiety Difficulty Not difficult at all Not difficult at all Not difficult at all Not difficult at all       11/08/2023   10:25 AM 10/29/2023   10:46 AM 04/28/2023   10:22 AM  Depression screen PHQ 2/9  Decreased Interest 0 0 0  Down, Depressed, Hopeless 0 0 0  PHQ - 2 Score 0 0 0  Altered sleeping 0 0 0  Tired, decreased energy 0 0 0  Change in appetite 0 0 0  Feeling bad or failure about yourself  0 0 0  Trouble concentrating 0 0 0  Moving slowly or fidgety/restless 0 0 0  Suicidal thoughts 0 0 0  PHQ-9 Score 0 0 0  Difficult doing work/chores Not difficult at all Not difficult at all Not difficult at all    BP Readings from Last 3 Encounters:  11/08/23 102/70  10/29/23 100/62  04/28/23 120/70    Physical Exam Vitals and nursing note reviewed. Exam conducted with a chaperone present.  Constitutional:      General: She is not in acute distress.    Appearance: She is not diaphoretic.  HENT:     Head: Normocephalic and atraumatic.     Right Ear: External ear normal.     Left Ear: External ear normal.     Nose: Nose normal.  Eyes:     General:        Right eye: No discharge.        Left eye: No discharge.     Conjunctiva/sclera: Conjunctivae normal.     Pupils: Pupils  are equal, round, and reactive to light.  Neck:     Thyroid: No thyromegaly.     Vascular: No JVD.  Cardiovascular:     Rate  and Rhythm: Normal rate and regular rhythm.     Heart sounds: Normal heart sounds. No murmur heard.    No friction rub. No gallop.  Pulmonary:     Effort: Pulmonary effort is normal.     Breath sounds: Normal breath sounds.  Abdominal:     General: Bowel sounds are normal.     Palpations: Abdomen is soft. There is no mass.     Tenderness: There is no abdominal tenderness. There is no guarding.  Musculoskeletal:        General: Normal range of motion.     Cervical back: Normal range of motion and neck supple.  Lymphadenopathy:     Cervical: No cervical adenopathy.  Skin:    General: Skin is warm and dry.  Neurological:     Mental Status: She is alert.     Deep Tendon Reflexes: Reflexes are normal and symmetric.     Wt Readings from Last 3 Encounters:  11/08/23 154 lb (69.9 kg)  10/29/23 154 lb (69.9 kg)  04/28/23 164 lb (74.4 kg)    BP 102/70   Pulse 75   Ht 5\' 5"  (1.651 m)   Wt 154 lb (69.9 kg)   SpO2 97%   BMI 25.63 kg/m   Assessment and Plan: 1. Abdominal pain, RLQ New onset.  Persistent.  Patient over the course of 2 months has had right lower quadrant pain associated with nausea and vomiting and change in bowel movements that are dark and change in consistency but not melanotic in appearance.  Examination of the patient notes tenderness and guarding in the right lower quadrant without rebound.  Since this has been going on for 2 months we will obtain a CT scan of the abdomen without contrast.  Will also add lipase and that the previous amylase was elevated.  Patient would also like ambulatory referral to gastroenterology at the [facility] gastroenterology. - Lipase - CT ABDOMEN W WO CONTRAST; Future - Ambulatory referral to Gastroenterology  2. Right lower quadrant abdominal pain See above - CT ABDOMEN PELVIS WO CONTRAST     Elizabeth Sauer, MD

## 2023-11-09 ENCOUNTER — Telehealth: Payer: Self-pay | Admitting: Family Medicine

## 2023-11-09 DIAGNOSIS — M4726 Other spondylosis with radiculopathy, lumbar region: Secondary | ICD-10-CM | POA: Diagnosis not present

## 2023-11-09 DIAGNOSIS — M48061 Spinal stenosis, lumbar region without neurogenic claudication: Secondary | ICD-10-CM | POA: Diagnosis not present

## 2023-11-09 DIAGNOSIS — M4807 Spinal stenosis, lumbosacral region: Secondary | ICD-10-CM | POA: Diagnosis not present

## 2023-11-09 DIAGNOSIS — M545 Low back pain, unspecified: Secondary | ICD-10-CM | POA: Diagnosis not present

## 2023-11-09 DIAGNOSIS — M79605 Pain in left leg: Secondary | ICD-10-CM | POA: Diagnosis not present

## 2023-11-09 DIAGNOSIS — M419 Scoliosis, unspecified: Secondary | ICD-10-CM | POA: Diagnosis not present

## 2023-11-09 LAB — LIPASE: Lipase: 40 U/L (ref 14–85)

## 2023-11-09 NOTE — Telephone Encounter (Signed)
Called patient and informed referral was sent to [facility] and she said she was called by [facility]. Wants sent to [facility] on [address] in [place].   - Nicole Cook

## 2023-11-09 NOTE — Telephone Encounter (Signed)
Copied from CRM [id number]. Topic: Referral - Request for Referral >> Nov 09, 2023 11:43 AM Patsy Lager T wrote: Did the patient discuss referral with their provider in the last year? Yes  Appointment offered? No  Type of order/referral and detailed reason for visit: Gastroenterologist  Preference of office, provider, location: [facility]  If referral order, have you been seen by this specialty before? No (If Yes, this issue or another issue? When? Where?  Can we respond through MyChart? No  Please do not send the referral to [facility] they are not able to get patient in wherein [facility] can.

## 2023-11-10 ENCOUNTER — Ambulatory Visit
Admission: RE | Admit: 2023-11-10 | Discharge: 2023-11-10 | Disposition: A | Discharge status: Home / Self Care | Payer: Medicare Other | Source: Ambulatory Visit | Attending: Family Medicine | Admitting: Family Medicine

## 2023-11-10 DIAGNOSIS — R109 Unspecified abdominal pain: Secondary | ICD-10-CM | POA: Diagnosis not present

## 2023-11-10 DIAGNOSIS — R1031 Right lower quadrant pain: Secondary | ICD-10-CM | POA: Diagnosis not present

## 2023-11-10 DIAGNOSIS — Z90710 Acquired absence of both cervix and uterus: Secondary | ICD-10-CM | POA: Diagnosis not present

## 2023-11-10 DIAGNOSIS — K4020 Bilateral inguinal hernia, without obstruction or gangrene, not specified as recurrent: Secondary | ICD-10-CM | POA: Diagnosis not present

## 2023-11-24 ENCOUNTER — Encounter: Payer: Self-pay | Admitting: Family Medicine

## 2023-11-24 ENCOUNTER — Other Ambulatory Visit: Payer: Self-pay | Admitting: Family Medicine

## 2023-11-24 DIAGNOSIS — R911 Solitary pulmonary nodule: Secondary | ICD-10-CM

## 2023-12-02 ENCOUNTER — Other Ambulatory Visit: Payer: Commercial Managed Care - PPO

## 2023-12-22 DIAGNOSIS — M1711 Unilateral primary osteoarthritis, right knee: Secondary | ICD-10-CM | POA: Diagnosis not present

## 2023-12-28 ENCOUNTER — Encounter: Payer: Self-pay | Admitting: Family Medicine

## 2023-12-28 ENCOUNTER — Ambulatory Visit: Payer: Medicare Other | Admitting: Family Medicine

## 2023-12-28 ENCOUNTER — Ambulatory Visit (INDEPENDENT_AMBULATORY_CARE_PROVIDER_SITE_OTHER): Payer: Medicare Other | Admitting: Family Medicine

## 2023-12-28 VITALS — BP 132/76 | HR 70 | Temp 97.8°F | Ht 65.0 in | Wt 160.2 lb

## 2023-12-28 DIAGNOSIS — J0100 Acute maxillary sinusitis, unspecified: Secondary | ICD-10-CM

## 2023-12-28 DIAGNOSIS — H748X1 Other specified disorders of right middle ear and mastoid: Secondary | ICD-10-CM

## 2023-12-28 DIAGNOSIS — M545 Low back pain, unspecified: Secondary | ICD-10-CM | POA: Diagnosis not present

## 2023-12-28 DIAGNOSIS — M79605 Pain in left leg: Secondary | ICD-10-CM | POA: Diagnosis not present

## 2023-12-28 MED ORDER — AZITHROMYCIN 250 MG PO TABS: ORAL_TABLET | ORAL | 0 refills | Status: AC

## 2023-12-28 NOTE — Progress Notes (Signed)
 Date:  12/28/2023   Name:  Nicole Cook   DOB:  Dec 06, 1951   MRN:  [id number]   Chief Complaint: Cough (X 4-5 days sore throat, drainage, and right ear pain. )  Cough This is a chronic problem. The current episode started more than 1 year ago. The problem has been gradually improving. The problem occurs constantly. The cough is Productive of sputum. Associated symptoms include ear pain, rhinorrhea, a sore throat, shortness of breath and sweats. Pertinent negatives include no chest pain, chills, fever, headaches, heartburn, hemoptysis, myalgias, nasal congestion, postnasal drip or wheezing. Nothing aggravates the symptoms.    Lab Results  Component Value Date   NA 140 10/29/2023   K 4.9 10/29/2023   CO2 26 10/29/2023   GLUCOSE 159 (H) 10/29/2023   BUN 14 10/29/2023   CREATININE 0.74 10/29/2023   CALCIUM  9.4 10/29/2023   EGFR 85 10/29/2023   GFRNONAA 74 11/21/2020   Lab Results  Component Value Date   CHOL 148 11/01/2023   HDL 46 11/01/2023   LDLCALC 84 11/01/2023   TRIG 99 11/01/2023   CHOLHDL 3.3 03/09/2018   No results found for: TSH Lab Results  Component Value Date   HGBA1C 7.6 09/28/2022   Lab Results  Component Value Date   WBC 6.7 10/29/2023   HGB 12.0 10/29/2023   HCT 38.4 10/29/2023   MCV 86 10/29/2023   PLT 210 10/29/2023   Lab Results  Component Value Date   ALT 15 10/29/2023   AST 14 10/29/2023   ALKPHOS 50 10/29/2023   BILITOT 0.2 10/29/2023   No results found for: 25OHVITD2, 25OHVITD3, VD25OH   Review of Systems  Constitutional:  Negative for chills and fever.  HENT:  Positive for ear pain, rhinorrhea and sore throat. Negative for ear discharge, hearing loss, postnasal drip, sinus pressure and sinus pain.   Respiratory:  Positive for cough and shortness of breath. Negative for hemoptysis, chest tightness and wheezing.   Cardiovascular:  Negative for chest pain, palpitations and leg swelling.  Gastrointestinal:  Negative for  heartburn.  Musculoskeletal:  Negative for myalgias.  Neurological:  Negative for headaches.    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   Hyperlipidemia associated with type 2 diabetes mellitus (HCC) 12/25/2019   Atrophic vaginitis 11/23/2018   Pelvic floor relaxation 11/23/2018   Primary osteoarthritis of right knee 05/12/2018   Sacral back pain 08/04/2017   Taking medication for chronic disease 03/02/2017   Seasonal allergic rhinitis due to pollen 03/02/2017   Gastroesophageal reflux disease 03/02/2017   Migraine with aura and without status migrainosus, not intractable 03/02/2017   Angina pectoris (HCC) 11/09/2016   Chronic right-sided low back pain without sciatica 10/29/2016   Trochanteric bursitis of right hip 07/31/2015   Primary osteoarthritis of right hip 07/31/2015   Type 2 diabetes mellitus with hyperglycemia, with long-term current use of insulin (HCC) 04/03/2015   Hyperlipidemia 04/03/2015   Laboratory animal allergy 04/03/2015   Cephalalgia 04/03/2015   Essential hypertension 04/03/2015   Blood glucose elevated 04/03/2015   Routine general medical examination at a health care facility 04/03/2015   CAD (coronary artery disease) 04/03/2015   Dermatochalasis of both upper eyelids 09/20/2014   Sleep apnea 09/03/2014   DDD (degenerative disc disease), lumbosacral 03/03/2012   Spinal stenosis of  lumbar region without neurogenic claudication 03/03/2012   Thoracic or lumbosacral neuritis or radiculitis, unspecified 03/03/2012    Allergies  Allergen Reactions   Aspartame Other (See Comments)   Compazine [Prochlorperazine]    Penicillins    Sulfa Antibiotics    Sulfamethoxazole-Trimethoprim Hives and Other (See Comments)    Other Reaction: Not Assessed      Past Surgical History:  Procedure Laterality Date   back surgery x 2     c-section x 3     CESAREAN SECTION  1976, 1979, 1982   COLONOSCOPY  2011   cleared for 5 yrs- Dr Parley   EYE SURGERY     eyelid lesion   KNEE ARTHROSCOPY Right 2010   SPINE SURGERY  2003,2006   TUBAL LIGATION  1982   VAGINAL HYSTERECTOMY  12/14/1994    Social History   Tobacco Use   Smoking status: Never   Smokeless tobacco: Never  Vaping Use   Vaping status: Never Used  Substance Use Topics   Alcohol use: No    Alcohol/week: 0.0 standard drinks of alcohol   Drug use: No     Medication list has been reviewed and updated.  Current Meds  Medication Sig   ACCU-CHEK FASTCLIX LANCETS MISC Accu-Chek Fastclix Lancet Drum   amLODipine  (NORVASC ) 5 MG tablet Take 1 tablet (5 mg total) by mouth daily.   aspirin 81 MG tablet Take 1 tablet by mouth daily.   cetirizine (ZYRTEC) 10 MG tablet Take 10 mg by mouth daily. otc   Cholecalciferol (VITAMIN D3) 1000 units CAPS Take 1 capsule by mouth daily.   Coenzyme Q10 300 MG CAPS Take 1 capsule by mouth daily.   DULoxetine (CYMBALTA) 30 MG capsule Take 30 mg by mouth daily.   estradiol (ESTRING) 2 MG vaginal ring Place 2 mg vaginally every 3 (three) months. follow package directions- GYN   fluticasone  (FLONASE ) 50 MCG/ACT nasal spray Place 1 spray into both nostrils daily.   gabapentin (NEURONTIN) 300 MG capsule Take 2 capsules by mouth 3 (three) times daily. [facility]/ pain doc may take 3 capsules 3 times per day per pt   hydrochlorothiazide  (HYDRODIURIL ) 25 MG tablet Take 1 tablet (25 mg total) by mouth every morning.   HYDROcodone-acetaminophen (NORCO) 10-325 MG per tablet Take 1 tablet by mouth as needed. [facility] Gets 40 tabs monthly   Insulin Aspart FlexPen (NOVOLOG) 100 UNIT/ML Up to 50 units per day after steroid shot based on blood sugars. Dx: E11.9   insulin lispro (HUMALOG) 100 UNIT/ML KwikPen    lidocaine  (LIDODERM ) 5 % 1 patch daily. [facility]/  Pain   lisinopril  (ZESTRIL ) 10 MG tablet Take 1 tablet (10 mg total) by mouth daily.   Magnesium 500 MG CAPS Take 1 capsule by mouth daily.   metFORMIN (GLUCOPHAGE) 1000 MG tablet 1 tablet 2 (two) times daily.   metoprolol  succinate (TOPROL -XL) 50 MG 24 hr tablet Take 1.5 tablets (75 mg total) by mouth daily. Take 1 and 1/2 tabs daily   montelukast  (SINGULAIR ) 10 MG tablet Take 1 tablet (10 mg total) by mouth at bedtime.   Multiple Vitamins-Minerals (CENTRUM SILVER ULTRA WOMENS PO) Take 1 tablet by mouth daily.   naloxone (NARCAN) nasal spray 4 mg/0.1 mL AS DIRECTED NASALLY OPIATE OVERDOSE, MR IF NEEDED 30 DAYS   nitroGLYCERIN (NITROSTAT) 0.4 MG SL tablet Place 1 tablet under the tongue as needed.   olopatadine  (PATANOL) 0.1 % ophthalmic solution Place 1 drop into both eyes 2 (two) times daily.  ondansetron  (ZOFRAN ) 4 MG tablet Take 1 tablet (4 mg total) by mouth every 8 (eight) hours as needed for nausea or vomiting.   ONE TOUCH ULTRA TEST test strip    OXYCONTIN 10 MG 12 hr tablet Take 10 mg by mouth 2 (two) times daily. [facility] Dr   pantoprazole  (PROTONIX ) 40 MG tablet Take 1 tablet (40 mg total) by mouth daily.   rosuvastatin  (CRESTOR ) 20 MG tablet Take 1 tablet (20 mg total) by mouth daily.   SUMAtriptan  (IMITREX ) 50 MG tablet Take 1 tablet (50 mg total) by mouth as needed. Per 90 days   tiZANidine (ZANAFLEX) 2 MG tablet Take 2-4 mg by mouth at bedtime as needed. [facility]/ [facility]   vitamin B-12 (CYANOCOBALAMIN ) 1000 MCG tablet Take 1,000 mcg by mouth daily.       12/28/2023    4:21 PM 11/08/2023   10:25 AM 10/29/2023   10:46 AM 04/28/2023   10:22 AM  GAD 7 : Generalized Anxiety Score  Nervous, Anxious, on Edge 0 0 0 0  Control/stop worrying 0 0 0 0  Worry too much - different things 0 0 0 0  Trouble relaxing 0 0 0 0  Restless 0 0 0 0  Easily annoyed or irritable 0 0 0 0  Afraid - awful might happen 0 0 0 0  Total GAD 7 Score 0 0 0 0  Anxiety Difficulty Not difficult at all  Not difficult at all Not difficult at all Not difficult at all       12/28/2023    4:21 PM 11/08/2023   10:25 AM 10/29/2023   10:46 AM  Depression screen PHQ 2/9  Decreased Interest 0 0 0  Down, Depressed, Hopeless 0 0 0  PHQ - 2 Score 0 0 0  Altered sleeping  0 0  Tired, decreased energy  0 0  Change in appetite  0 0  Feeling bad or failure about yourself   0 0  Trouble concentrating  0 0  Moving slowly or fidgety/restless  0 0  Suicidal thoughts  0 0  PHQ-9 Score  0 0  Difficult doing work/chores  Not difficult at all Not difficult at all    BP Readings from Last 3 Encounters:  12/28/23 132/76  11/08/23 102/70  10/29/23 100/62    Physical Exam Vitals and nursing note reviewed.  Constitutional:      General: She is not in acute distress.    Appearance: She is not diaphoretic.  HENT:     Head: Normocephalic and atraumatic.     Right Ear: Tympanic membrane and external ear normal.     Left Ear: Tympanic membrane and external ear normal.     Nose: Nose normal.     Mouth/Throat:     Mouth: Mucous membranes are moist.     Pharynx: No oropharyngeal exudate or posterior oropharyngeal erythema.  Eyes:     General:        Right eye: No discharge.        Left eye: No discharge.     Conjunctiva/sclera: Conjunctivae normal.     Pupils: Pupils are equal, round, and reactive to light.  Neck:     Thyroid : No thyromegaly.     Vascular: No JVD.  Cardiovascular:     Rate and Rhythm: Normal rate and regular rhythm.     Heart sounds: Normal heart sounds. No murmur heard.    No friction rub. No gallop.  Pulmonary:     Effort: Pulmonary  effort is normal.     Breath sounds: Normal breath sounds. No wheezing, rhonchi or rales.  Abdominal:     General: Bowel sounds are normal.     Palpations: Abdomen is soft. There is no mass.     Tenderness: There is no abdominal tenderness. There is no guarding or rebound.  Musculoskeletal:        General: Normal range of motion.     Cervical  back: Normal range of motion and neck supple.  Lymphadenopathy:     Cervical: No cervical adenopathy.  Skin:    General: Skin is warm and dry.     Capillary Refill: Capillary refill takes less than 2 seconds.  Neurological:     Mental Status: She is alert.     Deep Tendon Reflexes: Reflexes are normal and symmetric.     Wt Readings from Last 3 Encounters:  12/28/23 160 lb 3.2 oz (72.7 kg)  11/08/23 154 lb (69.9 kg)  10/29/23 154 lb (69.9 kg)    BP 132/76   Pulse 70   Temp 97.8 F (36.6 C) (Oral)   Ht 5' 5 (1.651 m)   Wt 160 lb 3.2 oz (72.7 kg)   SpO2 99%   BMI 26.66 kg/m   Assessment and Plan:     Cathryne Molt, MD

## 2023-12-29 DIAGNOSIS — M1712 Unilateral primary osteoarthritis, left knee: Secondary | ICD-10-CM | POA: Diagnosis not present

## 2024-01-04 ENCOUNTER — Ambulatory Visit: Payer: Medicare Other | Admitting: Family Medicine

## 2024-02-03 ENCOUNTER — Ambulatory Visit: Payer: Medicare Other | Admitting: Emergency Medicine

## 2024-02-03 VITALS — Ht 65.0 in | Wt 164.0 lb

## 2024-02-03 DIAGNOSIS — Z1231 Encounter for screening mammogram for malignant neoplasm of breast: Secondary | ICD-10-CM

## 2024-02-03 DIAGNOSIS — Z0000 Encounter for general adult medical examination without abnormal findings: Secondary | ICD-10-CM | POA: Diagnosis not present

## 2024-02-03 NOTE — Progress Notes (Signed)
Subjective:   Nicole Cook is a 73 y.o. who presents for a Medicare Wellness preventive visit.  Visit Complete: Virtual I connected with  Nicole Cook on 02/03/24 by a audio enabled telemedicine application and verified that I am speaking with the correct person using two identifiers.  Patient Location: Home  Provider Location: Home Office  I discussed the limitations of evaluation and management by telemedicine. The patient expressed understanding and agreed to proceed.  Vital Signs: Because this visit was a virtual/telehealth visit, some criteria may be missing or patient reported. Any vitals not documented were not able to be obtained and vitals that have been documented are patient reported.  VideoDeclined- This patient declined Librarian, academic. Therefore the visit was completed with audio only.  AWV Questionnaire: No: Patient Medicare AWV questionnaire was not completed prior to this visit.  Cardiac Risk Factors include: advanced age (>78men, >50 women);hypertension;diabetes mellitus;dyslipidemia;Other (see comment), Risk factor comments: CAD     Objective:    Today's Vitals   02/03/24 1301 02/03/24 1302  Weight: 164 lb (74.4 kg)   Height: 5\' 5"  (1.651 m)   PainSc:  3    Body mass index is 27.29 kg/m.     02/03/2024    1:16 PM 01/28/2023    1:38 PM 01/14/2022    3:37 PM 11/12/2021    1:21 PM 12/25/2020   10:40 AM 12/25/2019   10:38 AM 11/23/2018   11:44 AM  Advanced Directives  Does Patient Have a Medical Advance Directive? No No No No No No No  Would patient like information on creating a medical advance directive? No - Patient declined No - Patient declined No - Patient declined  No - Patient declined Yes (MAU/Ambulatory/Procedural Areas - Information given) Yes (MAU/Ambulatory/Procedural Areas - Information given)    Current Medications (verified) Outpatient Encounter Medications as of 02/03/2024  Medication Sig   ACCU-CHEK  FASTCLIX LANCETS MISC Accu-Chek Fastclix Lancet Drum   amLODipine (NORVASC) 5 MG tablet Take 1 tablet (5 mg total) by mouth daily.   aspirin 81 MG tablet Take 1 tablet by mouth daily.   cetirizine (ZYRTEC) 10 MG tablet Take 10 mg by mouth daily. otc   Cholecalciferol (VITAMIN D3) 1000 units CAPS Take 1 capsule by mouth daily.   Coenzyme Q10 300 MG CAPS Take 1 capsule by mouth daily.   DULoxetine (CYMBALTA) 30 MG capsule Take 30 mg by mouth daily.   estradiol (ESTRING) 2 MG vaginal ring Place 2 mg vaginally every 3 (three) months. follow package directions- GYN   fluticasone (FLONASE) 50 MCG/ACT nasal spray Place 1 spray into both nostrils daily.   gabapentin (NEURONTIN) 300 MG capsule Take 2 capsules by mouth 3 (three) times daily. [facility]/ pain doc may take 3 capsules 3 times per day per pt   glipiZIDE (GLUCOTROL XL) 5 MG 24 hr tablet Take 5 mg by mouth daily with breakfast.   hydrochlorothiazide (HYDRODIURIL) 25 MG tablet Take 1 tablet (25 mg total) by mouth every morning.   HYDROcodone-acetaminophen (NORCO) 10-325 MG per tablet Take 1 tablet by mouth as needed. [facility] Gets 40 tabs monthly   Insulin Aspart FlexPen (NOVOLOG) 100 UNIT/ML Up to 50 units per day after steroid shot based on blood sugars. Dx: E11.9   lidocaine (LIDODERM) 5 % 1 patch daily. [facility]/ Pain   lisinopril (ZESTRIL) 10 MG tablet Take 1 tablet (10 mg total) by mouth daily.   Magnesium 500 MG CAPS Take 1 capsule by mouth  daily.   metFORMIN (GLUCOPHAGE) 1000 MG tablet 1 tablet 2 (two) times daily.   metoprolol succinate (TOPROL-XL) 50 MG 24 hr tablet Take 1.5 tablets (75 mg total) by mouth daily. Take 1 and 1/2 tabs daily   montelukast (SINGULAIR) 10 MG tablet Take 1 tablet (10 mg total) by mouth at bedtime.   Multiple Vitamins-Minerals (CENTRUM SILVER ULTRA WOMENS PO) Take 1 tablet by mouth daily.   naloxone (NARCAN) nasal spray 4 mg/0.1 mL AS DIRECTED NASALLY OPIATE OVERDOSE, MR IF NEEDED 30 DAYS   nitroGLYCERIN  (NITROSTAT) 0.4 MG SL tablet Place 1 tablet under the tongue as needed.   ondansetron (ZOFRAN) 4 MG tablet Take 1 tablet (4 mg total) by mouth every 8 (eight) hours as needed for nausea or vomiting.   ONE TOUCH ULTRA TEST test strip    OXYCONTIN 10 MG 12 hr tablet Take 10 mg by mouth 2 (two) times daily. [facility]/Pain Dr   pantoprazole (PROTONIX) 40 MG tablet Take 1 tablet (40 mg total) by mouth daily.   rosuvastatin (CRESTOR) 20 MG tablet Take 1 tablet (20 mg total) by mouth daily.   SUMAtriptan (IMITREX) 50 MG tablet Take 1 tablet (50 mg total) by mouth as needed. Per 90 days   tiZANidine (ZANAFLEX) 2 MG tablet Take 2-4 mg by mouth at bedtime as needed. [facility]/ [facility]   vitamin B-12 (CYANOCOBALAMIN) 1000 MCG tablet Take 1,000 mcg by mouth daily.   insulin lispro (HUMALOG) 100 UNIT/ML KwikPen  (Patient not taking: Reported on 02/03/2024)   olopatadine (PATANOL) 0.1 % ophthalmic solution Place 1 drop into both eyes 2 (two) times daily. (Patient not taking: Reported on 02/03/2024)   No facility-administered encounter medications on file as of 02/03/2024.    Allergies (verified) Aspartame, Compazine [prochlorperazine], Penicillins, Sulfa antibiotics, and Sulfamethoxazole-trimethoprim   History: Past Medical History:  Diagnosis Date   Age-related nuclear cataract of both eyes 03/09/2021   Allergy    Arthritis    Cancer (HCC)    basal cell skin ca   Diabetes mellitus without complication (HCC)    GERD (gastroesophageal reflux disease)    Hyperlipidemia    Hypertension    Migraine    Myocardial infarction [facility]) May, 2011   Oxygen deficiency    CPAP   Pelvic floor relaxation    Sleep apnea    Thoracic or lumbosacral neuritis or radiculitis, unspecified    Past Surgical History:  Procedure Laterality Date   back surgery x 2     c-section x 3     CESAREAN SECTION  1976, 1979, 1982   COLONOSCOPY  2011   cleared for 5 yrs- Dr Theodoro Parma   EYE SURGERY     eyelid lesion   KNEE  ARTHROSCOPY Right 2010   SPINE SURGERY  2003,2006   TUBAL LIGATION  1982   VAGINAL HYSTERECTOMY  12/14/1994   Family History  Problem Relation Age of Onset   Breast cancer Mother 66   Arthritis Mother    Melanoma Mother    COPD Mother    Diabetes Mother    Hearing loss Mother    Heart disease Mother    Hypertension Mother    Stroke Mother    Parkinson's disease Father    Diabetes Father    Heart disease Father    Colon cancer Father 39   Prostate cancer Father        dx late 46s   Breast cancer Sister 48   Alcohol abuse Brother    Diabetes Brother  Diabetes Brother    Vaginal cancer Maternal Aunt        dx 67s   Bladder Cancer Maternal Uncle        dx 64s   Breast cancer Paternal Aunt        dx 14s   Stroke Maternal Grandfather    Cancer Paternal Grandmother    Pancreatic cancer Paternal Grandmother    Social History   Socioeconomic History   Marital status: Married    Spouse name: Leonette Most   Number of children: 3   Years of education: Not on file   Highest education level: Bachelor's degree (e.g., BA, AB, BS)  Occupational History   Occupation: Retired  Tobacco Use   Smoking status: Never   Smokeless tobacco: Never  Vaping Use   Vaping status: Never Used  Substance and Sexual Activity   Alcohol use: No    Alcohol/week: 0.0 standard drinks of alcohol   Drug use: No   Sexual activity: Yes    Birth control/protection: Post-menopausal  Other Topics Concern   Not on file  Social History Narrative   Not on file   Social Drivers of Health   Financial Resource Strain: Low Risk  (02/03/2024)   Overall Financial Resource Strain (CARDIA)    Difficulty of Paying Living Expenses: Not hard at all  Food Insecurity: No Food Insecurity (02/03/2024)   Hunger Vital Sign    Worried About Running Out of Food in the Last Year: Never true    Ran Out of Food in the Last Year: Never true  Transportation Needs: No Transportation Needs (02/03/2024)   PRAPARE -  Administrator, Civil Service (Medical): No    Lack of Transportation (Non-Medical): No  Physical Activity: Insufficiently Active (02/03/2024)   Exercise Vital Sign    Days of Exercise per Week: 3 days    Minutes of Exercise per Session: 10 min  Stress: No Stress Concern Present (02/03/2024)   [employer] of Occupational Health - Occupational Stress Questionnaire    Feeling of Stress : Not at all  Social Connections: Socially Integrated (02/03/2024)   Social Connection and Isolation Panel [NHANES]    Frequency of Communication with Friends and Family: More than three times a week    Frequency of Social Gatherings with Friends and Family: More than three times a week    Attends Religious Services: More than 4 times per year    Active Member of [employer] or Organizations: Yes    Attends Engineer, structural: More than 4 times per year    Marital Status: Married    Tobacco Counseling Counseling given: Not Answered    Clinical Intake:  Pre-visit preparation completed: Yes  Pain : 0-10 Pain Score: 3  Pain Type: Chronic pain Pain Location: Back Pain Descriptors / Indicators: Aching     BMI - recorded: 27.29 Nutritional Status: BMI 25 -29 Overweight Nutritional Risks: None Diabetes: Yes CBG done?: No (FBS 199 per patient) Did pt. bring in CBG monitor from home?: No  How often do you need to have someone help you when you read instructions, pamphlets, or other written materials from your doctor or pharmacy?: 1 - Never  Interpreter Needed?: No  Information entered by :: Tora Kindred, CMA   Activities of Daily Living     02/03/2024    1:05 PM  In your present state of health, do you have any difficulty performing the following activities:  Hearing? 0  Vision? 0  Difficulty concentrating or  making decisions? 0  Walking or climbing stairs? 1  Comment due to chronic back and knee pain  Dressing or bathing? 0  Doing errands, shopping? 0  Preparing  Food and eating ? N  Using the Toilet? N  In the past six months, have you accidently leaked urine? N  Do you have problems with loss of bowel control? N  Managing your Medications? N  Managing your Finances? N  Housekeeping or managing your Housekeeping? N    Patient Care Team: Duanne Limerick, MD as PCP - General (Family Medicine) Laretta Bolster, MD as Consulting Physician (Cardiology) Venita Sheffield, MD as Consulting Physician (Occupational Medicine) Doyle Askew, MD as Consulting Physician (Obstetrics and Gynecology) Sherlon Handing, MD as Consulting Physician (Internal Medicine) Koleen Distance, MD as Referring Physician (Ophthalmology)  Indicate any recent Medical Services you may have received from other than [facility] providers in the past year (date may be approximate).     Assessment:   This is a routine wellness examination for Fujiko.  Hearing/Vision screen Hearing Screening - Comments:: Denies hearing loss Vision Screening - Comments:: Gets eye exams, Dr. Alta Corning @ [facility]   Goals Addressed               This Visit's Progress     Patient Stated (pt-stated)        Exercise more       Depression Screen     02/03/2024    1:14 PM 12/28/2023    4:21 PM 11/08/2023   10:25 AM 10/29/2023   10:46 AM 04/28/2023   10:22 AM 01/28/2023    1:36 PM 10/28/2022    9:57 AM  PHQ 2/9 Scores  PHQ - 2 Score 0 0 0 0 0 0 0  PHQ- 9 Score   0 0 0 0 0    Fall Risk     02/03/2024    1:17 PM 12/28/2023    4:21 PM 11/08/2023   10:25 AM 10/29/2023   10:47 AM 04/28/2023   10:22 AM  Fall Risk   Falls in the past year? 1 0 0 0 0  Number falls in past yr: 0 0 0 0 0  Injury with Fall? 1 0 0 0 0  Risk for fall due to : History of fall(s);Impaired balance/gait;Orthopedic patient No Fall Risks No Fall Risks No Fall Risks No Fall Risks  Follow up Falls prevention discussed;Falls evaluation completed;Education provided Falls evaluation completed Falls evaluation  completed Falls evaluation completed Falls evaluation completed    MEDICARE RISK AT HOME:  Medicare Risk at Home Any stairs in or around the home?: Yes If so, are there any without handrails?: No Home free of loose throw rugs in walkways, pet beds, electrical cords, etc?: Yes Adequate lighting in your home to reduce risk of falls?: Yes Life alert?: No Use of a cane, walker or w/c?: No Grab bars in the bathroom?: Yes Shower chair or bench in shower?: Yes (has a shower chair, but doesn't need it) Elevated toilet seat or a handicapped toilet?: Yes  TIMED UP AND GO:  Was the test performed?  No  Cognitive Function: 6CIT completed        02/03/2024    1:20 PM 01/28/2023    1:50 PM 12/25/2019   10:44 AM 11/15/2017    1:45 PM  6CIT Screen  What Year? 0 points 0 points 0 points 0 points  What month? 0 points 0 points 0 points 0 points  What  time? 0 points 0 points 0 points 0 points  Count back from 20 0 points 0 points 0 points 0 points  Months in reverse 0 points 0 points 0 points 0 points  Repeat phrase 0 points 0 points 0 points 4 points  Total Score 0 points 0 points 0 points 4 points    Immunizations Immunization History  Administered Date(s) Administered   Fluad Quad(high Dose 65+) 10/02/2019, 10/08/2020, 09/21/2022, 10/27/2023   Hep B, Unspecified 12/14/1998   Hepatitis B 12/14/1998   Influenza, High Dose Seasonal PF 09/06/2017, 09/26/2018   Influenza,inj,Quad PF,6+ Mos 09/02/2015, 09/02/2016   Influenza-Unspecified 09/13/2012, 08/14/2013, 01/10/2015, 02/10/2015, 08/15/2015, 08/14/2017, 11/20/2019, 09/13/2021   MMR 05/14/1981   Moderna Sars-Covid-2 Vaccination 10/31/2020   PFIZER(Purple Top)SARS-COV-2 Vaccination 01/24/2020, 02/14/2020   Pfizer(Comirnaty)Fall Seasonal Vaccine 12 years and older 10/28/2022   Pneumococcal Polysaccharide-23 08/29/2014   Pneumococcal-Unspecified 05/14/2009, 08/14/2014   Td 07/08/2022   Tdap 01/14/2007, 02/12/2016, 03/02/2017   Zoster  Recombinant(Shingrix) 10/05/2022, 01/17/2023   Zoster, Live 01/14/2015, 02/10/2015    Screening Tests Health Maintenance  Topic Date Due   Pneumonia Vaccine 63+ Years old (2 of 2 - PCV) 08/30/2015   Colonoscopy  01/24/2023   HEMOGLOBIN A1C  03/30/2023   COVID-19 Vaccine (5 - 2024-25 season) 08/15/2023   MAMMOGRAM  10/01/2023   Diabetic kidney evaluation - Urine ACR  10/29/2023   FOOT EXAM  10/29/2023   OPHTHALMOLOGY EXAM  03/23/2024   Diabetic kidney evaluation - eGFR measurement  10/28/2024   Medicare Annual Wellness (AWV)  02/02/2025   DTaP/Tdap/Td (5 - Td or Tdap) 07/08/2032   INFLUENZA VACCINE  Completed   DEXA SCAN  Completed   Hepatitis C Screening  Completed   Zoster Vaccines- Shingrix  Completed   HPV VACCINES  Aged Out    Health Maintenance  Health Maintenance Due  Topic Date Due   Pneumonia Vaccine 59+ Years old (2 of 2 - PCV) 08/30/2015   Colonoscopy  01/24/2023   HEMOGLOBIN A1C  03/30/2023   COVID-19 Vaccine (5 - 2024-25 season) 08/15/2023   MAMMOGRAM  10/01/2023   Diabetic kidney evaluation - Urine ACR  10/29/2023   FOOT EXAM  10/29/2023   Health Maintenance Items Addressed: Mammogram ordered, See Nurse Notes  Additional Screening:  Vision Screening: Recommended annual ophthalmology exams for early detection of glaucoma and other disorders of the eye.  Dental Screening: Recommended annual dental exams for proper oral hygiene  Community Resource Referral / Chronic Care Management: CRR required this visit?  No   CCM required this visit?  No     Plan:     I have personally reviewed and noted the following in the patient's chart:   Medical and social history Use of alcohol, tobacco or illicit drugs  Current medications and supplements including opioid prescriptions. Patient is currently taking opioid prescriptions. Information provided to patient regarding non-opioid alternatives. Patient advised to discuss non-opioid treatment plan with their  provider. Functional ability and status Nutritional status Physical activity Advanced directives List of other physicians Hospitalizations, surgeries, and ER visits in previous 12 months Vitals Screenings to include cognitive, depression, and falls Referrals and appointments  In addition, I have reviewed and discussed with patient certain preventive protocols, quality metrics, and best practice recommendations. A written personalized care plan for preventive services as well as general preventive health recommendations were provided to patient.     Tora Kindred, CMA   02/03/2024   After Visit Summary: (MyChart) Due to this being a telephonic visit, the  after visit summary with patients personalized plan was offered to patient via MyChart   Notes:  Placed order for a MMG Patient due for A1c, has OV with endocrinology 02/07/24. Also needs DM foot exam Patient has DM eye exam scheduled for 03/2024 Patient has OV with GI on 02/11/24 to discuss colonoscopy that is due. Patient states she had Prevnar20 at [facility] in 2024 Declined DM & Nutrition education

## 2024-02-03 NOTE — Patient Instructions (Addendum)
[name] , Thank you for taking time to come for your Medicare Wellness Visit. I appreciate your ongoing commitment to your health goals. Please review the following plan we discussed and let me know if I can assist you in the future.   Referrals/Orders/Follow-Ups/Clinician Recommendations: I have placed an order for a mammogram. Please call [facility] @ [phone] to schedule at your earliest convenience.  This is a list of the screening recommended for you and due dates:  Health Maintenance  Topic Date Due   Pneumonia Vaccine (2 of 2 - PCV) 08/30/2015   Colon Cancer Screening  01/24/2023   Hemoglobin A1C  03/30/2023   COVID-19 Vaccine (5 - 2024-25 season) 08/15/2023   Mammogram  10/01/2023   Yearly kidney health urinalysis for diabetes  10/29/2023   Complete foot exam   10/29/2023   Eye exam for diabetics  03/23/2024   Yearly kidney function blood test for diabetes  10/28/2024   DEXA scan (bone density measurement)  01/16/2025   Medicare Annual Wellness Visit  02/02/2025   DTaP/Tdap/Td vaccine (5 - Td or Tdap) 07/08/2032   Flu Shot  Completed   Hepatitis C Screening  Completed   Zoster (Shingles) Vaccine  Completed   HPV Vaccine  Aged Out    Advanced directives: (Declined) Advance directive discussed with you today. Even though you declined this today, please call our office should you change your mind, and we can give you the proper paperwork for you to fill out.  Next Medicare Annual Wellness Visit scheduled for next year: Yes, 02/15/25 @ 1:10pm (video visit)  Fall Prevention in the Home, Adult Falls can cause injuries and affect people of all ages. There are many simple things that you can do to make your home safe and to help prevent falls. If you need it, ask for help making these changes. What actions can I take to prevent falls? General information Use good lighting in all rooms. Make sure to: Replace any light bulbs that burn out. Turn on lights if it is dark  and use night-lights. Keep items that you use often in easy-to-reach places. Lower the shelves around your home if needed. Move furniture so that there are clear paths around it. Do not keep throw rugs or other things on the floor that can make you trip. If any of your floors are uneven, fix them. Add color or contrast paint or tape to clearly mark and help you see: Grab bars or handrails. First and last steps of staircases. Where the edge of each step is. If you use a ladder or stepladder: Make sure that it is fully opened. Do not climb a closed ladder. Make sure the sides of the ladder are locked in place. Have someone hold the ladder while you use it. Know where your pets are as you move through your home. What can I do in the bathroom?     Keep the floor dry. Clean up any water that is on the floor right away. Remove soap buildup in the bathtub or shower. Buildup makes bathtubs and showers slippery. Use non-skid mats or decals on the floor of the bathtub or shower. Attach bath mats securely with double-sided, non-slip rug tape. If you need to sit down while you are in the shower, use a non-slip stool. Install grab bars by the toilet and in the bathtub and shower. Do not use towel bars as grab bars. What can I do in the bedroom? Make sure that you have a  light by your bed that is easy to reach. Do not use any sheets or blankets on your bed that hang to the floor. Have a firm bench or chair with side arms that you can use for support when you get dressed. What can I do in the kitchen? Clean up any spills right away. If you need to reach something above you, use a sturdy step stool that has a grab bar. Keep electrical cables out of the way. Do not use floor polish or wax that makes floors slippery. What can I do with my stairs? Do not leave anything on the stairs. Make sure that you have a light switch at the top and the bottom of the stairs. Have them installed if you do not have  them. Make sure that there are handrails on both sides of the stairs. Fix handrails that are broken or loose. Make sure that handrails are as long as the staircases. Install non-slip stair treads on all stairs in your home if they do not have carpet. Avoid having throw rugs at the top or bottom of stairs, or secure the rugs with carpet tape to prevent them from moving. Choose a carpet design that does not hide the edge of steps on the stairs. Make sure that carpet is firmly attached to the stairs. Fix any carpet that is loose or worn. What can I do on the outside of my home? Use bright outdoor lighting. Repair the edges of walkways and driveways and fix any cracks. Clear paths of anything that can make you trip, such as tools or rocks. Add color or contrast paint or tape to clearly mark and help you see high doorway thresholds. Trim any bushes or trees on the main path into your home. Check that handrails are securely fastened and in good repair. Both sides of all steps should have handrails. Install guardrails along the edges of any raised decks or porches. Have leaves, snow, and ice cleared regularly. Use sand, salt, or ice melt on walkways during winter months if you live where there is ice and snow. In the garage, clean up any spills right away, including grease or oil spills. What other actions can I take? Review your medicines with your health care provider. Some medicines can make you confused or feel dizzy. This can increase your chance of falling. Wear closed-toe shoes that fit well and support your feet. Wear shoes that have rubber soles and low heels. Use a cane, walker, scooter, or crutches that help you move around if needed. Talk with your provider about other ways that you can decrease your risk of falls. This may include seeing a physical therapist to learn to do exercises to improve movement and strength. Where to find more information Centers for Disease Control and Prevention,  [employer]: TonerPromos.no [employer] on Aging: BaseRingTones.pl National Institute on Aging: BaseRingTones.pl Contact a health care provider if: You are afraid of falling at home. You feel weak, drowsy, or dizzy at home. You fall at home. Get help right away if you: Lose consciousness or have trouble moving after a fall. Have a fall that causes a head injury. These symptoms may be an emergency. Get help right away. Call 911. Do not wait to see if the symptoms will go away. Do not drive yourself to the hospital. This information is not intended to replace advice given to you by your health care provider. Make sure you discuss any questions you have with your health care provider. Document Revised:  08/03/2022 Document Reviewed: 08/03/2022 Elsevier Patient Education  2024 [employer]  Managing Pain Without Opioids Opioids are strong medicines used to treat moderate to severe pain. For some people, especially those who have long-term (chronic) pain, opioids may not be the best choice for pain management due to: Side effects like nausea, constipation, and sleepiness. The risk of addiction (opioid use disorder). The longer you take opioids, the greater your risk of addiction. Pain that lasts for more than 3 months is called chronic pain. Managing chronic pain usually requires more than one approach and is often provided by a team of health care providers working together (multidisciplinary approach). Pain management may be done at a pain management center or pain clinic. How to manage pain without the use of opioids Use non-opioid medicines Non-opioid medicines for pain may include: Over-the-counter or prescription non-steroidal anti-inflammatory drugs (NSAIDs). These may be the first medicines used for pain. They work well for muscle and bone pain, and they reduce swelling. Acetaminophen. This over-the-counter medicine may work well for milder pain but not swelling. Antidepressants. These may be used to  treat chronic pain. A certain type of antidepressant (tricyclics) is often used. These medicines are given in lower doses for pain than when used for depression. Anticonvulsants. These are usually used to treat seizures but may also reduce nerve (neuropathic) pain. Muscle relaxants. These relieve pain caused by sudden muscle tightening (spasms). You may also use a pain medicine that is applied to the skin as a patch, cream, or gel (topical analgesic), such as a numbing medicine. These may cause fewer side effects than medicines taken by mouth. Do certain therapies as directed Some therapies can help with pain management. They include: Physical therapy. You will do exercises to gain strength and flexibility. A physical therapist may teach you exercises to move and stretch parts of your body that are weak, stiff, or painful. You can learn these exercises at physical therapy visits and practice them at home. Physical therapy may also involve: Massage. Heat wraps or applying heat or cold to affected areas. Electrical signals that interrupt pain signals (transcutaneous electrical nerve stimulation, TENS). Weak lasers that reduce pain and swelling (low-level laser therapy). Signals from your body that help you learn to regulate pain (biofeedback). Occupational therapy. This helps you to learn ways to function at home and work with less pain. Recreational therapy. This involves trying new activities or hobbies, such as a physical activity or drawing. Mental health therapy, including: Cognitive behavioral therapy (CBT). This helps you learn coping skills for dealing with pain. Acceptance and commitment therapy (ACT) to change the way you think and react to pain. Relaxation therapies, including muscle relaxation exercises and mindfulness-based stress reduction. Pain management counseling. This may be individual, family, or group counseling.  Receive medical treatments Medical treatments for pain  management include: Nerve block injections. These may include a pain blocker and anti-inflammatory medicines. You may have injections: Near the spine to relieve chronic back or neck pain. Into joints to relieve back or joint pain. Into nerve areas that supply a painful area to relieve body pain. Into muscles (trigger point injections) to relieve some painful muscle conditions. A medical device placed near your spine to help block pain signals and relieve nerve pain or chronic back pain (spinal cord stimulation device). Acupuncture. Follow these instructions at home Medicines Take over-the-counter and prescription medicines only as told by your health care provider. If you are taking pain medicine, ask your health care providers about possible side  effects to watch out for. Do not drive or use heavy machinery while taking prescription opioid pain medicine. Lifestyle  Do not use drugs or alcohol to reduce pain. If you drink alcohol, limit how much you have to: 0-1 drink a day for women who are not pregnant. 0-2 drinks a day for men. Know how much alcohol is in a drink. In the [place] one drink equals one 12 oz bottle of beer (355 mL), one 5 oz glass of wine (148 mL), or one 1 oz glass of hard liquor (44 mL). Do not use any products that contain nicotine or tobacco. These products include cigarettes, chewing tobacco, and vaping devices, such as e-cigarettes. If you need help quitting, ask your health care provider. Eat a healthy diet and maintain a healthy weight. Poor diet and excess weight may make pain worse. Eat foods that are high in fiber. These include fresh fruits and vegetables, whole grains, and beans. Limit foods that are high in fat and processed sugars, such as fried and sweet foods. Exercise regularly. Exercise lowers stress and may help relieve pain. Ask your health care provider what activities and exercises are safe for you. If your health care provider approves, join an  exercise class that combines movement and stress reduction. Examples include yoga and tai chi. Get enough sleep. Lack of sleep may make pain worse. Lower stress as much as possible. Practice stress reduction techniques as told by your therapist. General instructions Work with all your pain management providers to find the treatments that work best for you. You are an important member of your pain management team. There are many things you can do to reduce pain on your own. Consider joining an online or in-person support group for people who have chronic pain. Keep all follow-up visits. This is important. Where to find more information You can find more information about managing pain without opioids from: American Academy of Pain Medicine: painmed.org Institute for Chronic Pain: instituteforchronicpain.org American Chronic Pain Association: theacpa.org Contact a health care provider if: You have side effects from pain medicine. Your pain gets worse or does not get better with treatments or home therapy. You are struggling with anxiety or depression. Summary Many types of pain can be managed without opioids. Chronic pain may respond better to pain management without opioids. Pain is best managed when you and a team of health care providers work together. Pain management without opioids may include non-opioid medicines, medical treatments, physical therapy, mental health therapy, and lifestyle changes. Tell your health care providers if your pain gets worse or is not being managed well enough. This information is not intended to replace advice given to you by your health care provider. Make sure you discuss any questions you have with your health care provider. Document Revised: 03/12/2021 Document Reviewed: 03/12/2021 Elsevier Patient Education  2024 [employer].

## 2024-02-07 DIAGNOSIS — E1142 Type 2 diabetes mellitus with diabetic polyneuropathy: Secondary | ICD-10-CM | POA: Diagnosis not present

## 2024-02-07 DIAGNOSIS — E785 Hyperlipidemia, unspecified: Secondary | ICD-10-CM | POA: Diagnosis not present

## 2024-02-07 DIAGNOSIS — E1169 Type 2 diabetes mellitus with other specified complication: Secondary | ICD-10-CM | POA: Diagnosis not present

## 2024-02-07 DIAGNOSIS — I152 Hypertension secondary to endocrine disorders: Secondary | ICD-10-CM | POA: Diagnosis not present

## 2024-02-07 DIAGNOSIS — E1159 Type 2 diabetes mellitus with other circulatory complications: Secondary | ICD-10-CM | POA: Diagnosis not present

## 2024-02-07 LAB — MICROALBUMIN / CREATININE URINE RATIO: Microalb Creat Ratio: 24.3

## 2024-02-11 DIAGNOSIS — R109 Unspecified abdominal pain: Secondary | ICD-10-CM | POA: Diagnosis not present

## 2024-02-11 DIAGNOSIS — Z800 Family history of malignant neoplasm of digestive organs: Secondary | ICD-10-CM | POA: Diagnosis not present

## 2024-02-14 ENCOUNTER — Other Ambulatory Visit: Payer: Self-pay | Admitting: Obstetrics and Gynecology

## 2024-02-14 DIAGNOSIS — Z1231 Encounter for screening mammogram for malignant neoplasm of breast: Secondary | ICD-10-CM

## 2024-02-17 ENCOUNTER — Other Ambulatory Visit: Payer: Self-pay | Admitting: Family Medicine

## 2024-02-17 DIAGNOSIS — R110 Nausea: Secondary | ICD-10-CM

## 2024-02-17 DIAGNOSIS — J301 Allergic rhinitis due to pollen: Secondary | ICD-10-CM

## 2024-02-17 DIAGNOSIS — G43109 Migraine with aura, not intractable, without status migrainosus: Secondary | ICD-10-CM

## 2024-02-22 ENCOUNTER — Ambulatory Visit (INDEPENDENT_AMBULATORY_CARE_PROVIDER_SITE_OTHER): Admitting: Family Medicine

## 2024-02-22 ENCOUNTER — Encounter: Payer: Self-pay | Admitting: Family Medicine

## 2024-02-22 VITALS — BP 112/78 | HR 70 | Resp 16 | Wt 168.1 lb

## 2024-02-22 DIAGNOSIS — H6991 Unspecified Eustachian tube disorder, right ear: Secondary | ICD-10-CM | POA: Diagnosis not present

## 2024-02-22 DIAGNOSIS — H9311 Tinnitus, right ear: Secondary | ICD-10-CM

## 2024-02-22 NOTE — Progress Notes (Signed)
 Date:  02/22/2024   Name:  Nicole Cook   DOB:  06/29/1951   MRN:  [id number]   Chief Complaint: Sinus Problem and Ear Fullness (Right Ear )  Sinus Problem This is a new problem. The problem has been gradually improving since onset. There has been no fever. The pain is mild (mild maxillary discomfort). Associated symptoms include congestion, ear pain and sinus pressure. Pertinent negatives include no chills, coughing, diaphoresis, headaches, hoarse voice, neck pain, shortness of breath, sneezing, sore throat or swollen glands. Past treatments include oral decongestants (mucinex).  Ear Fullness  There is pain in the right ear. The current episode started 1 to 4 weeks ago. The problem occurs every few hours. The problem has been waxing and waning. Associated symptoms include abdominal pain. Pertinent negatives include no coughing, diarrhea, ear discharge, headaches, hearing loss, neck pain, rash, rhinorrhea, sore throat or vomiting. The treatment provided moderate relief. There is no history of a chronic ear infection, hearing loss or a tympanostomy tube.    Lab Results  Component Value Date   NA 140 10/29/2023   K 4.9 10/29/2023   CO2 26 10/29/2023   GLUCOSE 159 (H) 10/29/2023   BUN 14 10/29/2023   CREATININE 0.74 10/29/2023   CALCIUM 9.4 10/29/2023   EGFR 85 10/29/2023   GFRNONAA 74 11/21/2020   Lab Results  Component Value Date   CHOL 148 11/01/2023   HDL 46 11/01/2023   LDLCALC 84 11/01/2023   TRIG 99 11/01/2023   CHOLHDL 3.3 03/09/2018   No results found for: "TSH" Lab Results  Component Value Date   HGBA1C 7.6 09/28/2022   Lab Results  Component Value Date   WBC 6.7 10/29/2023   HGB 12.0 10/29/2023   HCT 38.4 10/29/2023   MCV 86 10/29/2023   PLT 210 10/29/2023   Lab Results  Component Value Date   ALT 15 10/29/2023   AST 14 10/29/2023   ALKPHOS 50 10/29/2023   BILITOT 0.2 10/29/2023   No results found for: "25OHVITD2", "25OHVITD3", "VD25OH"   Review  of Systems  Constitutional:  Negative for chills and diaphoresis.  HENT:  Positive for congestion, ear pain, postnasal drip, sinus pressure and tinnitus. Negative for ear discharge, hearing loss, hoarse voice, nosebleeds, rhinorrhea, sneezing and sore throat.   Respiratory:  Negative for cough and shortness of breath.   Cardiovascular:  Negative for chest pain and palpitations.  Gastrointestinal:  Positive for abdominal pain. Negative for diarrhea, nausea and vomiting.  Musculoskeletal:  Negative for neck pain.  Skin:  Negative for rash.  Neurological:  Negative for headaches.    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   Hyperlipidemia associated with type 2 diabetes mellitus (HCC) 12/25/2019   Atrophic vaginitis 11/23/2018   Pelvic floor relaxation 11/23/2018   Primary osteoarthritis of right knee 05/12/2018   Sacral back pain 08/04/2017   Taking medication for chronic disease 03/02/2017   Seasonal allergic rhinitis due to pollen 03/02/2017   Gastroesophageal reflux disease 03/02/2017   Migraine with aura and without status migrainosus, not intractable 03/02/2017   Angina pectoris (HCC) 11/09/2016   Chronic right-sided low back pain without sciatica 10/29/2016   Trochanteric bursitis of right hip 07/31/2015   Primary osteoarthritis of right hip 07/31/2015   Type 2 diabetes mellitus with hyperglycemia,  with long-term current use of insulin (HCC) 04/03/2015   Hyperlipidemia 04/03/2015   Laboratory animal allergy 04/03/2015   Cephalalgia 04/03/2015   Essential hypertension 04/03/2015   Blood glucose elevated 04/03/2015   Routine general medical examination at a health care facility 04/03/2015   CAD (coronary artery disease) 04/03/2015   Dermatochalasis of both upper  eyelids 09/20/2014   Sleep apnea 09/03/2014   DDD (degenerative disc disease), lumbosacral 03/03/2012   Spinal stenosis of lumbar region without neurogenic claudication 03/03/2012   Thoracic or lumbosacral neuritis or radiculitis, unspecified 03/03/2012    Allergies  Allergen Reactions   Aspartame Other (See Comments)   Compazine [Prochlorperazine]    Penicillins    Sulfa Antibiotics    Sulfamethoxazole-Trimethoprim Hives and Other (See Comments)    Other Reaction: Not Assessed     Past Surgical History:  Procedure Laterality Date   back surgery x 2     c-section x 3     CESAREAN SECTION  1976, 1979, 1982   COLONOSCOPY  2011   cleared for 5 yrs- Dr Theodoro Parma   EYE SURGERY     eyelid lesion   KNEE ARTHROSCOPY Right 2010   SPINE SURGERY  2003,2006   TUBAL LIGATION  1982   VAGINAL HYSTERECTOMY  12/14/1994    Social History   Tobacco Use   Smoking status: Never   Smokeless tobacco: Never  Vaping Use   Vaping status: Never Used  Substance Use Topics   Alcohol use: No    Alcohol/week: 0.0 standard drinks of alcohol   Drug use: No     Medication list has been reviewed and updated.  Current Meds  Medication Sig   ACCU-CHEK FASTCLIX LANCETS MISC Accu-Chek Fastclix Lancet Drum   amLODipine (NORVASC) 5 MG tablet Take 1 tablet (5 mg total) by mouth daily.   aspirin 81 MG tablet Take 1 tablet by mouth daily.   cetirizine (ZYRTEC) 10 MG tablet Take 10 mg by mouth daily. otc   Cholecalciferol (VITAMIN D3) 1000 units CAPS Take 1 capsule by mouth daily.   Coenzyme Q10 300 MG CAPS Take 1 capsule by mouth daily.   DULoxetine (CYMBALTA) 30 MG capsule Take 30 mg by mouth daily.   empagliflozin (JARDIANCE) 25 MG TABS tablet Take 1 tablet by mouth daily.   estradiol (ESTRING) 2 MG vaginal ring Place 2 mg vaginally every 3 (three) months. follow package directions- GYN   fluticasone (FLONASE) 50 MCG/ACT nasal spray USE 1 SPRAY IN EACH NOSTRIL DAILY   gabapentin (NEURONTIN) 300 MG  capsule Take 2 capsules by mouth 3 (three) times daily. [facility]/ pain doc may take 3 capsules 3 times per day per pt   glipiZIDE (GLUCOTROL) 10 MG tablet Take 10 mg by mouth daily before breakfast.   hydrochlorothiazide (HYDRODIURIL) 25 MG tablet Take 1 tablet (25 mg total) by mouth every morning.   HYDROcodone-acetaminophen (NORCO) 10-325 MG per tablet Take 1 tablet by mouth as needed. [facility] Gets 40 tabs monthly   Insulin Aspart FlexPen (NOVOLOG) 100 UNIT/ML Up to 50 units per day after steroid shot based on blood sugars. Dx: E11.9   lidocaine (LIDODERM) 5 % 1 patch daily. [facility]   lisinopril (ZESTRIL) 10 MG tablet Take 1 tablet (10 mg total) by mouth daily.   Magnesium 500 MG CAPS Take 1 capsule by mouth daily.   metFORMIN (GLUCOPHAGE) 1000 MG tablet 1 tablet 2 (two) times daily.   metoprolol succinate (TOPROL-XL) 50 MG 24 hr tablet Take 1.5 tablets (75  mg total) by mouth daily. Take 1 and 1/2 tabs daily   montelukast (SINGULAIR) 10 MG tablet Take 1 tablet (10 mg total) by mouth at bedtime.   Multiple Vitamins-Minerals (CENTRUM SILVER ULTRA WOMENS PO) Take 1 tablet by mouth daily.   naloxone (NARCAN) nasal spray 4 mg/0.1 mL AS DIRECTED NASALLY OPIATE OVERDOSE, MR IF NEEDED 30 DAYS   nitroGLYCERIN (NITROSTAT) 0.4 MG SL tablet Place 1 tablet under the tongue as needed.   ondansetron (ZOFRAN) 4 MG tablet TAKE 1 TABLET EVERY 8 HOURS AS NEEDED FOR NAUSEA OR VOMITING   ONE TOUCH ULTRA TEST test strip    OXYCONTIN 10 MG 12 hr tablet Take 10 mg by mouth 2 (two) times daily. [facility] Dr   pantoprazole (PROTONIX) 40 MG tablet Take 1 tablet (40 mg total) by mouth daily.   rosuvastatin (CRESTOR) 20 MG tablet Take 1 tablet (20 mg total) by mouth daily.   SUMAtriptan (IMITREX) 50 MG tablet Take 1 tablet (50 mg total) by mouth as needed. Per 90 days   tiZANidine (ZANAFLEX) 2 MG tablet Take 2-4 mg by mouth at bedtime as needed. [facility]   vitamin B-12 (CYANOCOBALAMIN) 1000 MCG tablet  Take 1,000 mcg by mouth daily.       12/28/2023    4:21 PM 11/08/2023   10:25 AM 10/29/2023   10:46 AM 04/28/2023   10:22 AM  GAD 7 : Generalized Anxiety Score  Nervous, Anxious, on Edge 0 0 0 0  Control/stop worrying 0 0 0 0  Worry too much - different things 0 0 0 0  Trouble relaxing 0 0 0 0  Restless 0 0 0 0  Easily annoyed or irritable 0 0 0 0  Afraid - awful might happen 0 0 0 0  Total GAD 7 Score 0 0 0 0  Anxiety Difficulty Not difficult at all Not difficult at all Not difficult at all Not difficult at all       02/03/2024    1:14 PM 12/28/2023    4:21 PM 11/08/2023   10:25 AM  Depression screen PHQ 2/9  Decreased Interest 0 0 0  Down, Depressed, Hopeless 0 0 0  PHQ - 2 Score 0 0 0  Altered sleeping   0  Tired, decreased energy   0  Change in appetite   0  Feeling bad or failure about yourself    0  Trouble concentrating   0  Moving slowly or fidgety/restless   0  Suicidal thoughts   0  PHQ-9 Score   0  Difficult doing work/chores   Not difficult at all    BP Readings from Last 3 Encounters:  02/22/24 112/78  12/28/23 132/76  11/08/23 102/70    Physical Exam Vitals and nursing note reviewed.  Constitutional:      General: She is not in acute distress.    Appearance: She is not diaphoretic.  HENT:     Head: Normocephalic and atraumatic.     Right Ear: Tympanic membrane and external ear normal.     Left Ear: Tympanic membrane and external ear normal.     Nose: Nose normal. No congestion or rhinorrhea.     Mouth/Throat:     Pharynx: Oropharynx is clear.  Eyes:     General:        Right eye: No discharge.        Left eye: No discharge.     Conjunctiva/sclera: Conjunctivae normal.     Pupils: Pupils are equal,  round, and reactive to light.  Neck:     Thyroid: No thyromegaly.     Vascular: No JVD.  Cardiovascular:     Rate and Rhythm: Normal rate and regular rhythm.     Heart sounds: Normal heart sounds. No murmur heard.    No friction rub. No  gallop.  Pulmonary:     Effort: Pulmonary effort is normal. No respiratory distress.     Breath sounds: No wheezing or rhonchi.  Abdominal:     General: Bowel sounds are normal.     Palpations: Abdomen is soft. There is no mass.     Tenderness: There is no abdominal tenderness. There is no guarding.  Musculoskeletal:        General: Normal range of motion.     Cervical back: Normal range of motion and neck supple.  Lymphadenopathy:     Cervical: No cervical adenopathy.  Skin:    General: Skin is warm and dry.  Neurological:     Mental Status: She is alert.     Deep Tendon Reflexes: Reflexes are normal and symmetric.     Wt Readings from Last 3 Encounters:  02/22/24 168 lb 1.6 oz (76.2 kg)  02/03/24 164 lb (74.4 kg)  12/28/23 160 lb 3.2 oz (72.7 kg)    BP 112/78   Pulse 70   Resp 16   Wt 168 lb 1.6 oz (76.2 kg)   SpO2 99%   BMI 27.97 kg/m   Assessment and Plan: 1. Eustachian tube disorder, right (Primary) New onset.  Episodic.  Intermittent.  This is probably allergy related and we reviewed the importance of taking an intranasal steroid on a regular basis.  We also reviewed that we could take a low-dose short acting Sudafed to enable drainage as she has tried this for short period of time and it did make it better and this would help with opening of the eustachian tube and therefore would be able to transmit better the sound to the hearing sensation.  As well as saline lavage of the sinuses and avoidance of antihistamines. - Ambulatory referral to ENT  2. Tinnitus of right ear New onset.  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  We will refer  to ear nose and throat to determine that this is transmission of the polyps versus truly a pulsating tinnitus particularly given that the patient had a recent past hemorrhagic otitis that was noted which seems to be clearing but on examination of the tympanic membrane today is that it is retracted with no visual bleeding at this time. - Ambulatory referral to ENT     Elizabeth Sauer, MD

## 2024-03-01 DIAGNOSIS — H903 Sensorineural hearing loss, bilateral: Secondary | ICD-10-CM | POA: Diagnosis not present

## 2024-03-01 DIAGNOSIS — J300 Vasomotor rhinitis: Secondary | ICD-10-CM | POA: Diagnosis not present

## 2024-03-01 DIAGNOSIS — J301 Allergic rhinitis due to pollen: Secondary | ICD-10-CM | POA: Diagnosis not present

## 2024-03-01 DIAGNOSIS — H6983 Other specified disorders of Eustachian tube, bilateral: Secondary | ICD-10-CM | POA: Diagnosis not present

## 2024-03-23 ENCOUNTER — Ambulatory Visit
Admission: RE | Admit: 2024-03-23 | Discharge: 2024-03-23 | Disposition: A | Discharge status: Home / Self Care | Source: Ambulatory Visit | Attending: Family Medicine | Admitting: Family Medicine

## 2024-03-23 DIAGNOSIS — H21233 Degeneration of iris (pigmentary), bilateral: Secondary | ICD-10-CM | POA: Diagnosis not present

## 2024-03-23 DIAGNOSIS — Z1231 Encounter for screening mammogram for malignant neoplasm of breast: Secondary | ICD-10-CM | POA: Insufficient documentation

## 2024-03-23 DIAGNOSIS — H2513 Age-related nuclear cataract, bilateral: Secondary | ICD-10-CM | POA: Diagnosis not present

## 2024-03-23 DIAGNOSIS — E119 Type 2 diabetes mellitus without complications: Secondary | ICD-10-CM | POA: Diagnosis not present

## 2024-04-27 ENCOUNTER — Ambulatory Visit (INDEPENDENT_AMBULATORY_CARE_PROVIDER_SITE_OTHER): Payer: Self-pay | Admitting: Family Medicine

## 2024-04-27 ENCOUNTER — Encounter: Payer: Self-pay | Admitting: Family Medicine

## 2024-04-27 VITALS — BP 114/70 | HR 66 | Ht 65.0 in | Wt 164.2 lb

## 2024-04-27 DIAGNOSIS — J301 Allergic rhinitis due to pollen: Secondary | ICD-10-CM

## 2024-04-27 DIAGNOSIS — E785 Hyperlipidemia, unspecified: Secondary | ICD-10-CM

## 2024-04-27 DIAGNOSIS — R110 Nausea: Secondary | ICD-10-CM | POA: Diagnosis not present

## 2024-04-27 DIAGNOSIS — K219 Gastro-esophageal reflux disease without esophagitis: Secondary | ICD-10-CM

## 2024-04-27 DIAGNOSIS — I10 Essential (primary) hypertension: Secondary | ICD-10-CM | POA: Diagnosis not present

## 2024-04-27 DIAGNOSIS — R748 Abnormal levels of other serum enzymes: Secondary | ICD-10-CM

## 2024-04-27 DIAGNOSIS — G43109 Migraine with aura, not intractable, without status migrainosus: Secondary | ICD-10-CM

## 2024-04-27 MED ORDER — AMLODIPINE BESYLATE 5 MG PO TABS: 5.0000 mg | ORAL_TABLET | Freq: Every day | ORAL | 1 refills | Status: DC

## 2024-04-27 MED ORDER — MONTELUKAST SODIUM 10 MG PO TABS: 10.0000 mg | ORAL_TABLET | Freq: Every day | ORAL | 1 refills | Status: DC

## 2024-04-27 MED ORDER — HYDROCHLOROTHIAZIDE 25 MG PO TABS: 25.0000 mg | ORAL_TABLET | Freq: Every morning | ORAL | 1 refills | Status: DC

## 2024-04-27 MED ORDER — METOPROLOL SUCCINATE ER 50 MG PO TB24: 75.0000 mg | ORAL_TABLET | Freq: Every day | ORAL | 1 refills | Status: DC

## 2024-04-27 MED ORDER — LISINOPRIL 10 MG PO TABS: 10.0000 mg | ORAL_TABLET | Freq: Every day | ORAL | 1 refills | Status: DC

## 2024-04-27 MED ORDER — FLUTICASONE PROPIONATE 50 MCG/ACT NA SUSP: 1.0000 | Freq: Every day | NASAL | 5 refills | Status: DC

## 2024-04-27 MED ORDER — ROSUVASTATIN CALCIUM 20 MG PO TABS: 20.0000 mg | ORAL_TABLET | Freq: Every day | ORAL | 1 refills | Status: DC

## 2024-04-27 MED ORDER — PANTOPRAZOLE SODIUM 40 MG PO TBEC: 40.0000 mg | DELAYED_RELEASE_TABLET | Freq: Every day | ORAL | 1 refills | Status: DC

## 2024-04-27 MED ORDER — ONDANSETRON HCL 4 MG PO TABS: 4.0000 mg | ORAL_TABLET | Freq: Three times a day (TID) | ORAL | 3 refills | Status: DC | PRN

## 2024-04-27 MED ORDER — SUMATRIPTAN SUCCINATE 50 MG PO TABS: 50.0000 mg | ORAL_TABLET | ORAL | 1 refills | Status: DC | PRN

## 2024-04-27 NOTE — Progress Notes (Signed)
 Date:  04/27/2024   Name:  Nicole Cook   DOB:  Jul 02, 1951   MRN:  [id number]   Chief Complaint: Medication Refill and Hypertension  Hypertension This is a chronic problem. The current episode started more than 1 year ago. The problem has been gradually improving since onset. The problem is controlled. Pertinent negatives include no anxiety, blurred vision, chest pain, headaches, malaise/fatigue, neck pain, orthopnea, palpitations, peripheral edema, PND, shortness of breath or sweats. There are no associated agents to hypertension. Past treatments include calcium  channel blockers, diuretics, angiotensin blockers and beta blockers. The current treatment provides moderate improvement. There is no history of chronic renal disease.  Hyperlipidemia This is a chronic problem. The current episode started more than 1 year ago. The problem is controlled. Recent lipid tests were reviewed and are normal. She has no history of chronic renal disease. Pertinent negatives include no chest pain, focal sensory loss, focal weakness, leg pain, myalgias or shortness of breath. Current antihyperlipidemic treatment includes statins. The current treatment provides moderate improvement of lipids. There are no compliance problems.  Risk factors for coronary artery disease include dyslipidemia and hypertension.  Gastroesophageal Reflux She reports no belching, no chest pain, no choking, no coughing, no dysphagia, no heartburn, no hoarse voice, no nausea, no sore throat or no wheezing. This is a chronic problem. The current episode started more than 1 year ago. The problem has been gradually improving. The symptoms are aggravated by certain foods. Pertinent negatives include no anemia, fatigue, melena, muscle weakness, orthopnea or weight loss. Risk factors include NSAIDs. She has tried a PPI for the symptoms. The treatment provided moderate relief.  URI  This is a chronic (template for allergic /rhinitis) problem. The  current episode started more than 1 year ago. The problem has been gradually improving. Pertinent negatives include no chest pain, coughing, dysuria, headaches, joint swelling, nausea, neck pain, rhinorrhea, sinus pain, sneezing, sore throat, vomiting or wheezing. The treatment provided mild relief.    Lab Results  Component Value Date   NA 140 10/29/2023   K 4.9 10/29/2023   CO2 26 10/29/2023   GLUCOSE 159 (H) 10/29/2023   BUN 14 10/29/2023   CREATININE 0.74 10/29/2023   CALCIUM  9.4 10/29/2023   EGFR 85 10/29/2023   GFRNONAA 74 11/21/2020   Lab Results  Component Value Date   CHOL 148 11/01/2023   HDL 46 11/01/2023   LDLCALC 84 11/01/2023   TRIG 99 11/01/2023   CHOLHDL 3.3 03/09/2018   No results found for: "TSH" Lab Results  Component Value Date   HGBA1C 7.6 09/28/2022   Lab Results  Component Value Date   WBC 6.7 10/29/2023   HGB 12.0 10/29/2023   HCT 38.4 10/29/2023   MCV 86 10/29/2023   PLT 210 10/29/2023   Lab Results  Component Value Date   ALT 15 10/29/2023   AST 14 10/29/2023   ALKPHOS 50 10/29/2023   BILITOT 0.2 10/29/2023   No results found for: "25OHVITD2", "25OHVITD3", "VD25OH"   Review of Systems  Constitutional:  Negative for fatigue, malaise/fatigue and weight loss.  HENT:  Negative for hoarse voice, rhinorrhea, sinus pain, sneezing, sore throat, trouble swallowing and voice change.   Eyes:  Negative for blurred vision.  Respiratory:  Negative for cough, choking, shortness of breath and wheezing.   Cardiovascular:  Negative for chest pain, palpitations, orthopnea and PND.  Gastrointestinal:  Negative for blood in stool, dysphagia, heartburn, melena, nausea and vomiting.  Genitourinary:  Negative for difficulty  urinating, dysuria, hematuria and vaginal bleeding.  Musculoskeletal:  Negative for myalgias, muscle weakness and neck pain.  Neurological:  Negative for focal weakness and headaches.    Patient Active Problem List   Diagnosis Date Noted    Genetic testing 11/02/2022   Age-related nuclear cataract of both eyes 03/09/2021   Diabetes mellitus type 2 without retinopathy (HCC) 03/09/2021   Pigmentary dispersion syndrome, bilateral 03/09/2021   Hyperlipidemia associated with type 2 diabetes mellitus (HCC) 12/25/2019   Atrophic vaginitis 11/23/2018   Pelvic floor relaxation 11/23/2018   Primary osteoarthritis of right knee 05/12/2018   Sacral back pain 08/04/2017   Taking medication for chronic disease 03/02/2017   Seasonal allergic rhinitis due to pollen 03/02/2017   Gastroesophageal reflux disease 03/02/2017   Migraine with aura and without status migrainosus, not intractable 03/02/2017   Angina pectoris (HCC) 11/09/2016   Chronic right-sided low back pain without sciatica 10/29/2016   Trochanteric bursitis of right hip 07/31/2015   Primary osteoarthritis of right hip 07/31/2015   Type 2 diabetes mellitus with hyperglycemia, with long-term current use of insulin (HCC) 04/03/2015   Hyperlipidemia 04/03/2015   Laboratory animal allergy 04/03/2015   Cephalalgia 04/03/2015   Essential hypertension 04/03/2015   Blood glucose elevated 04/03/2015   Routine general medical examination at a health care facility 04/03/2015   CAD (coronary artery disease) 04/03/2015   Dermatochalasis of both upper eyelids 09/20/2014   Sleep apnea 09/03/2014   DDD (degenerative disc disease), lumbosacral 03/03/2012   Spinal stenosis of lumbar region without neurogenic claudication 03/03/2012   Thoracic or lumbosacral neuritis or radiculitis, unspecified 03/03/2012    Allergies  Allergen Reactions   Aspartame Other (See Comments)   Compazine [Prochlorperazine]    Penicillins    Sulfa Antibiotics    Sulfamethoxazole-Trimethoprim Hives and Other (See Comments)    Other Reaction: Not Assessed     Past Surgical History:  Procedure Laterality Date   back surgery x 2     c-section x 3     CESAREAN SECTION  1976, 1979, 1982   COLONOSCOPY   2011   cleared for 5 yrs- Dr Anton Kirsten   EYE SURGERY     eyelid lesion   KNEE ARTHROSCOPY Right 2010   SPINE SURGERY  2003,2006   TUBAL LIGATION  1982   VAGINAL HYSTERECTOMY  12/14/1994    Social History   Tobacco Use   Smoking status: Never   Smokeless tobacco: Never  Vaping Use   Vaping status: Never Used  Substance Use Topics   Alcohol use: No    Alcohol/week: 0.0 standard drinks of alcohol   Drug use: No     Medication list has been reviewed and updated.  Current Meds  Medication Sig   ACCU-CHEK FASTCLIX LANCETS MISC Accu-Chek Fastclix Lancet Drum   amLODipine  (NORVASC ) 5 MG tablet Take 1 tablet (5 mg total) by mouth daily.   aspirin 81 MG tablet Take 1 tablet by mouth daily.   cetirizine (ZYRTEC) 10 MG tablet Take 10 mg by mouth daily. otc   Cholecalciferol (VITAMIN D3) 1000 units CAPS Take 1 capsule by mouth daily.   Coenzyme Q10 300 MG CAPS Take 1 capsule by mouth daily.   DULoxetine (CYMBALTA) 30 MG capsule Take 30 mg by mouth daily.   empagliflozin (JARDIANCE) 25 MG TABS tablet Take 1 tablet by mouth daily.   estradiol (ESTRING) 2 MG vaginal ring Place 2 mg vaginally every 3 (three) months. follow package directions- GYN   fluticasone  (FLONASE ) 50 MCG/ACT nasal  spray USE 1 SPRAY IN EACH NOSTRIL DAILY   gabapentin (NEURONTIN) 300 MG capsule Take 2 capsules by mouth 3 (three) times daily. [facility]/ pain doc may take 3 capsules 3 times per day per pt   glipiZIDE (GLUCOTROL) 10 MG tablet Take 10 mg by mouth daily before breakfast.   hydrochlorothiazide  (HYDRODIURIL ) 25 MG tablet Take 1 tablet (25 mg total) by mouth every morning.   HYDROcodone-acetaminophen (NORCO) 10-325 MG per tablet Take 1 tablet by mouth as needed. [facility] Gets 40 tabs monthly   Insulin Aspart FlexPen (NOVOLOG) 100 UNIT/ML Up to 50 units per day after steroid shot based on blood sugars. Dx: E11.9   lidocaine  (LIDODERM ) 5 % 1 patch daily. [facility]   lisinopril  (ZESTRIL ) 10 MG tablet Take 1  tablet (10 mg total) by mouth daily.   Magnesium 500 MG CAPS Take 1 capsule by mouth daily.   metFORMIN (GLUCOPHAGE) 1000 MG tablet 1 tablet 2 (two) times daily.   metoprolol  succinate (TOPROL -XL) 50 MG 24 hr tablet Take 1.5 tablets (75 mg total) by mouth daily. Take 1 and 1/2 tabs daily   montelukast  (SINGULAIR ) 10 MG tablet Take 1 tablet (10 mg total) by mouth at bedtime.   Multiple Vitamins-Minerals (CENTRUM SILVER ULTRA WOMENS PO) Take 1 tablet by mouth daily.   naloxone (NARCAN) nasal spray 4 mg/0.1 mL AS DIRECTED NASALLY OPIATE OVERDOSE, MR IF NEEDED 30 DAYS   nitroGLYCERIN (NITROSTAT) 0.4 MG SL tablet Place 1 tablet under the tongue as needed.   ondansetron  (ZOFRAN ) 4 MG tablet TAKE 1 TABLET EVERY 8 HOURS AS NEEDED FOR NAUSEA OR VOMITING   ONE TOUCH ULTRA TEST test strip    OXYCONTIN 10 MG 12 hr tablet Take 10 mg by mouth 2 (two) times daily. [facility] Dr   pantoprazole  (PROTONIX ) 40 MG tablet Take 1 tablet (40 mg total) by mouth daily.   rosuvastatin  (CRESTOR ) 20 MG tablet Take 1 tablet (20 mg total) by mouth daily.   SUMAtriptan  (IMITREX ) 50 MG tablet Take 1 tablet (50 mg total) by mouth as needed. Per 90 days   tiZANidine (ZANAFLEX) 2 MG tablet Take 2-4 mg by mouth at bedtime as needed. [facility]   vitamin B-12 (CYANOCOBALAMIN ) 1000 MCG tablet Take 1,000 mcg by mouth daily.       12/28/2023    4:21 PM 11/08/2023   10:25 AM 10/29/2023   10:46 AM 04/28/2023   10:22 AM  GAD 7 : Generalized Anxiety Score  Nervous, Anxious, on Edge 0 0 0 0  Control/stop worrying 0 0 0 0  Worry too much - different things 0 0 0 0  Trouble relaxing 0 0 0 0  Restless 0 0 0 0  Easily annoyed or irritable 0 0 0 0  Afraid - awful might happen 0 0 0 0  Total GAD 7 Score 0 0 0 0  Anxiety Difficulty Not difficult at all Not difficult at all Not difficult at all Not difficult at all       02/03/2024    1:14 PM 12/28/2023    4:21 PM 11/08/2023   10:25 AM  Depression screen PHQ 2/9  Decreased  Interest 0 0 0  Down, Depressed, Hopeless 0 0 0  PHQ - 2 Score 0 0 0  Altered sleeping   0  Tired, decreased energy   0  Change in appetite   0  Feeling bad or failure about yourself    0  Trouble concentrating   0  Moving slowly or fidgety/restless   0  Suicidal thoughts   0  PHQ-9 Score   0  Difficult doing work/chores   Not difficult at all    BP Readings from Last 3 Encounters:  04/27/24 114/70  02/22/24 112/78  12/28/23 132/76    Physical Exam Vitals and nursing note reviewed.  Constitutional:      General: She is not in acute distress.    Appearance: She is not diaphoretic.  HENT:     Head: Normocephalic and atraumatic.     Right Ear: Tympanic membrane and external ear normal.     Left Ear: Tympanic membrane and external ear normal.     Nose: Nose normal.     Mouth/Throat:     Mouth: Mucous membranes are moist.  Eyes:     General:        Right eye: No discharge.        Left eye: No discharge.     Conjunctiva/sclera: Conjunctivae normal.     Pupils: Pupils are equal, round, and reactive to light.  Neck:     Thyroid : No thyromegaly.     Vascular: No JVD.  Cardiovascular:     Rate and Rhythm: Normal rate and regular rhythm.     Heart sounds: Normal heart sounds. No murmur heard.    No friction rub. No gallop.  Pulmonary:     Effort: Pulmonary effort is normal.     Breath sounds: Normal breath sounds. No wheezing, rhonchi or rales.  Abdominal:     General: Bowel sounds are normal.     Palpations: Abdomen is soft. There is no mass.     Tenderness: There is no abdominal tenderness. There is no guarding.  Musculoskeletal:        General: Normal range of motion.     Cervical back: Normal range of motion and neck supple.  Lymphadenopathy:     Cervical: No cervical adenopathy.  Skin:    General: Skin is warm and dry.  Neurological:     Mental Status: She is alert.     Deep Tendon Reflexes: Reflexes are normal and symmetric.     Wt Readings from Last 3  Encounters:  04/27/24 164 lb 3 oz (74.5 kg)  02/22/24 168 lb 1.6 oz (76.2 kg)  02/03/24 164 lb (74.4 kg)    BP 114/70   Pulse 66   Ht 5\' 5"  (1.651 m)   Wt 164 lb 3 oz (74.5 kg)   SpO2 99%   BMI 27.32 kg/m   Assessment and Plan: 1. Essential hypertension (Primary) Chronic.  Controlled.  Stable.  Asymptomatic.  Blood pressure today 114/70.  Continue amlodipine  5 mg, hydrochlorothiazide  25 mg, lisinopril  10 mg once a day and metoprolol  XL 50 mg 1-1/2 tablets a day.  Will check CMP for electrolytes and GFR.  Will recheck patient in 6 months. - amLODipine  (NORVASC ) 5 MG tablet; Take 1 tablet (5 mg total) by mouth daily.  Dispense: 90 tablet; Refill: 1 - hydrochlorothiazide  (HYDRODIURIL ) 25 MG tablet; Take 1 tablet (25 mg total) by mouth every morning.  Dispense: 90 tablet; Refill: 1 - lisinopril  (ZESTRIL ) 10 MG tablet; Take 1 tablet (10 mg total) by mouth daily.  Dispense: 90 tablet; Refill: 1 - metoprolol  succinate (TOPROL -XL) 50 MG 24 hr tablet; Take 1.5 tablets (75 mg total) by mouth daily. Take 1 and 1/2 tabs daily  Dispense: 135 tablet; Refill: 1 - Comprehensive metabolic panel with GFR  2. Seasonal allergic rhinitis due to pollen  Chronic.  Controlled.  Stable.  Continue Flonase  nasal spray 1 spray both nostrils daily as well as montelukast  10 mg once a day. - fluticasone  (FLONASE ) 50 MCG/ACT nasal spray; Place 1 spray into both nostrils daily.  Dispense: 16 g; Refill: 5 - montelukast  (SINGULAIR ) 10 MG tablet; Take 1 tablet (10 mg total) by mouth at bedtime.  Dispense: 90 tablet; Refill: 1  3. Gastroesophageal reflux disease, unspecified whether esophagitis present Chronic.  Controlled.  Stable.  Continue pantoprazole  40 mg once a day. - pantoprazole  (PROTONIX ) 40 MG tablet; Take 1 tablet (40 mg total) by mouth daily.  Dispense: 90 tablet; Refill: 1  4. Hyperlipidemia, unspecified hyperlipidemia type Chronic.  Controlled.  Stable.  Continue rosuvastatin  20 mg once a day.  Patient  continues to take statin without concerns of myalgias or muscle weakness.  Check lipid panel for current level of LDL control. - rosuvastatin  (CRESTOR ) 20 MG tablet; Take 1 tablet (20 mg total) by mouth daily.  Dispense: 90 tablet; Refill: 1 - Lipid Panel With LDL/HDL Ratio  5. Migraine with aura and without status migrainosus, not intractable Chronic.  Controlled.  Stable.  Continue sumatriptan  50 mg as needed and along with that we will do tandem with Zofran  4 mg as needed 8 hours for nausea vomiting. - SUMAtriptan  (IMITREX ) 50 MG tablet; Take 1 tablet (50 mg total) by mouth as needed. Per 90 days  Dispense: 10 tablet; Refill: 1 - ondansetron  (ZOFRAN ) 4 MG tablet; Take 1 tablet (4 mg total) by mouth every 8 (eight) hours as needed for nausea or vomiting.  Dispense: 20 tablet; Refill: 3  6. Nausea Chronic.  Controlled.  Stable.  There was mild abnormalities increase which we will recheck today to see if that has returned to baseline normal. - ondansetron  (ZOFRAN ) 4 MG tablet; Take 1 tablet (4 mg total) by mouth every 8 (eight) hours as needed for nausea or vomiting.  Dispense: 20 tablet; Refill: 3  7. Elevated amylase Patient's had elevated amylase in the past and we will recheck to see if this has returned to baseline normal. - Amylase     Alayne Allis, MD

## 2024-04-28 ENCOUNTER — Ambulatory Visit: Payer: Self-pay | Admitting: Family Medicine

## 2024-04-28 LAB — COMPREHENSIVE METABOLIC PANEL WITH GFR
ALT: 15 IU/L (ref 0–32)
AST: 15 IU/L (ref 0–40)
Albumin: 4.7 g/dL (ref 3.8–4.8)
Alkaline Phosphatase: 52 IU/L (ref 44–121)
BUN/Creatinine Ratio: 22 (ref 12–28)
BUN: 21 mg/dL (ref 8–27)
Bilirubin Total: 0.3 mg/dL (ref 0.0–1.2)
CO2: 25 mmol/L (ref 20–29)
Calcium: 9.8 mg/dL (ref 8.7–10.3)
Chloride: 95 mmol/L — ABNORMAL LOW (ref 96–106)
Creatinine, Ser: 0.95 mg/dL (ref 0.57–1.00)
Globulin, Total: 2.4 g/dL (ref 1.5–4.5)
Glucose: 173 mg/dL — ABNORMAL HIGH (ref 70–99)
Potassium: 4.7 mmol/L (ref 3.5–5.2)
Sodium: 139 mmol/L (ref 134–144)
Total Protein: 7.1 g/dL (ref 6.0–8.5)
eGFR: 63 mL/min/{1.73_m2} (ref >59)

## 2024-04-28 LAB — LIPID PANEL WITH LDL/HDL RATIO
Cholesterol, Total: 132 mg/dL (ref 100–199)
HDL: 36 mg/dL — ABNORMAL LOW (ref >39)
LDL Chol Calc (NIH): 66 mg/dL (ref 0–99)
LDL/HDL Ratio: 1.8 ratio (ref 0.0–3.2)
Triglycerides: 176 mg/dL — ABNORMAL HIGH (ref 0–149)
VLDL Cholesterol Cal: 30 mg/dL (ref 5–40)

## 2024-04-28 LAB — AMYLASE: Amylase: 97 U/L (ref 31–110)

## 2024-05-23 ENCOUNTER — Ambulatory Visit
Admission: RE | Admit: 2024-05-23 | Discharge: 2024-05-23 | Disposition: A | Discharge status: Home / Self Care | Source: Ambulatory Visit | Attending: Family Medicine | Admitting: Family Medicine

## 2024-05-23 ENCOUNTER — Ambulatory Visit: Payer: Commercial Managed Care - PPO

## 2024-05-23 DIAGNOSIS — I700 Atherosclerosis of aorta: Secondary | ICD-10-CM | POA: Diagnosis not present

## 2024-05-23 DIAGNOSIS — R911 Solitary pulmonary nodule: Secondary | ICD-10-CM | POA: Insufficient documentation

## 2024-05-23 DIAGNOSIS — R918 Other nonspecific abnormal finding of lung field: Secondary | ICD-10-CM | POA: Diagnosis not present

## 2024-05-29 ENCOUNTER — Ambulatory Visit: Payer: Self-pay | Admitting: Family Medicine

## 2024-05-29 NOTE — Progress Notes (Signed)
 There are few spots on her right lung which are stable, recommend quit smoking if she is smoking. She also has severe cholesterol build up in her heart. Does she see Cardiology? Recommend taking baby aspirin daily, may cause gastric irritation and increase risk for GI bleed. If tolerated she can continue.  Also please check if she is following Lung doctor. If she has Qs she can see me in office. Dr.K

## 2024-05-30 ENCOUNTER — Ambulatory Visit: Payer: Self-pay

## 2024-05-30 NOTE — Telephone Encounter (Signed)
 Copied from CRM [id number]. Topic: Clinical - Lab/Test Results >> May 30, 2024  2:29 PM Nicole Cook T wrote: Reason for CRM: patient called back for CT scan results Answer Assessment - Initial Assessment Questions 1. REASON FOR CALL or QUESTION: What is your reason for calling today? or How can I best help you? or What question do you have that I can help answer?     Pt returned office all regarding results from imaging.  Nurse informed pt of the following: There are few spots on her right lung which are stable, recommend quit smoking if she is smoking. She also has severe cholesterol build up in her heart. Does she see Cardiology? Recommend taking baby aspirin daily, may cause gastric irritation and increase risk for GI bleed. If tolerated she can continue.  Also please check if she is following Lung doctor. If she has Qs she can see me in office.  Pt responded she is not and never has been a smoker, pt has been taking baby ASA for 15 years.  Pt sees a pulmonary 1/yr and is not being followed by cardiology.  Pt has more questions for PCP therefore scheduled appt for pt to come and talk with PCP regarding plan of care, comparison of CT scans previously done. Etc.  Protocols used: Information Only Call - No Triage-A-AH

## 2024-05-30 NOTE — Telephone Encounter (Signed)
 FYI  KP

## 2024-05-30 NOTE — Telephone Encounter (Signed)
 Noted. Ty!

## 2024-05-31 DIAGNOSIS — E1142 Type 2 diabetes mellitus with diabetic polyneuropathy: Secondary | ICD-10-CM | POA: Diagnosis not present

## 2024-06-01 ENCOUNTER — Ambulatory Visit: Admitting: Family Medicine

## 2024-06-01 ENCOUNTER — Encounter: Payer: Self-pay | Admitting: Family Medicine

## 2024-06-01 VITALS — BP 120/68 | HR 65 | Ht 65.0 in | Wt 168.0 lb

## 2024-06-01 DIAGNOSIS — E1169 Type 2 diabetes mellitus with other specified complication: Secondary | ICD-10-CM

## 2024-06-01 DIAGNOSIS — Z7984 Long term (current) use of oral hypoglycemic drugs: Secondary | ICD-10-CM

## 2024-06-01 DIAGNOSIS — I2510 Atherosclerotic heart disease of native coronary artery without angina pectoris: Secondary | ICD-10-CM | POA: Diagnosis not present

## 2024-06-01 DIAGNOSIS — E785 Hyperlipidemia, unspecified: Secondary | ICD-10-CM

## 2024-06-01 DIAGNOSIS — Z794 Long term (current) use of insulin: Secondary | ICD-10-CM

## 2024-06-01 DIAGNOSIS — R911 Solitary pulmonary nodule: Secondary | ICD-10-CM | POA: Diagnosis not present

## 2024-06-01 LAB — HEMOGLOBIN A1C: Hemoglobin A1C: 8.1

## 2024-06-01 NOTE — Progress Notes (Signed)
 Established Patient Office Visit  Subjective   Patient ID: Nicole Cook, female    DOB: [birth year]  Age: 73 y.o. MRN: [id number]  Chief Complaint  Patient presents with   Results    Pt wants to go over lung scans     Assessment & Plan:   Problem List Items Addressed This Visit       Cardiovascular and Mediastinum   CAD (coronary artery disease)     Endocrine   Hyperlipidemia associated with type 2 diabetes mellitus (HCC)   Other Visit Diagnoses       Right lower lobe pulmonary nodule    -  Primary   Relevant Orders   Ambulatory referral to Pulmonology      CT chest :  IMPRESSION: 1. Stable 4 mm right lower lobe pulmonary nodule. No follow-up needed if patient is low-risk.This recommendation follows the consensus statement: Guidelines for Management of Incidental Pulmonary Nodules Detected on CT Images: From the Fleischner Society 2017; Radiology 2017; 284:228-243. 2. Mild lingular, inferior medial right upper lobe and bilateral lower lobe linear scarring and/or atelectasis. 3. Marked severity coronary artery calcification. 4. Aortic atherosclerosis.  Pt on statin and Torpol XL, will follow up with Cardiology.  Lung nodule: Pulm referral placed.  No follow-ups on file.   73 year old female with past medical history of diabetes mellitus type 2, hypertension, history of MI, lung nodule, hyperlipidemia presents to the clinic to go over her recent CT chest scan concerning for pulmonary nodule.  Patient does not have a pulmonology referral and would like to have 1.  Patient denies any symptoms of shortness of breath, chest pain, palpitations, cough.   Hx of MI 2011 S/p stents x 2. Dr.Komada  [facility] November 2024.       Review of Systems  All other systems reviewed and are negative.     Objective:     BP 120/68   Pulse 65   Ht 5' 5 (1.651 m)   Wt 168 lb (76.2 kg)   SpO2 98%   BMI 27.96 kg/m    Physical Exam Vitals and nursing  note reviewed.  Constitutional:      Appearance: Normal appearance.  HENT:     Head: Normocephalic.     Right Ear: External ear normal.     Left Ear: External ear normal.   Eyes:     Conjunctiva/sclera: Conjunctivae normal.    Cardiovascular:     Rate and Rhythm: Normal rate.  Pulmonary:     Effort: Pulmonary effort is normal. No respiratory distress.  Abdominal:     Palpations: Abdomen is soft.   Musculoskeletal:        General: Normal range of motion.   Skin:    General: Skin is warm.   Neurological:     Mental Status: She is alert and oriented to person, place, and time.   Psychiatric:        Mood and Affect: Mood normal.      Results for orders placed or performed in visit on 06/01/24  Microalbumin / creatinine urine ratio  Result Value Ref Range   Microalb Creat Ratio 24.3   Results for orders placed or performed in visit on 06/01/24  Hemoglobin A1c  Result Value Ref Range   Hemoglobin A1C 8.1       The ASCVD Risk score (Arnett DK, et al., 2019) failed to calculate for the following reasons:   Risk score cannot be calculated because patient has a medical history  suggesting prior/existing ASCVD      Nicole Bilberry, MD

## 2024-06-02 DIAGNOSIS — M5416 Radiculopathy, lumbar region: Secondary | ICD-10-CM | POA: Diagnosis not present

## 2024-06-05 DIAGNOSIS — E785 Hyperlipidemia, unspecified: Secondary | ICD-10-CM | POA: Diagnosis not present

## 2024-06-05 DIAGNOSIS — E1159 Type 2 diabetes mellitus with other circulatory complications: Secondary | ICD-10-CM | POA: Diagnosis not present

## 2024-06-05 DIAGNOSIS — I152 Hypertension secondary to endocrine disorders: Secondary | ICD-10-CM | POA: Diagnosis not present

## 2024-06-05 DIAGNOSIS — E1142 Type 2 diabetes mellitus with diabetic polyneuropathy: Secondary | ICD-10-CM | POA: Diagnosis not present

## 2024-06-05 DIAGNOSIS — E1169 Type 2 diabetes mellitus with other specified complication: Secondary | ICD-10-CM | POA: Diagnosis not present

## 2024-06-13 DIAGNOSIS — Z1331 Encounter for screening for depression: Secondary | ICD-10-CM | POA: Diagnosis not present

## 2024-06-13 DIAGNOSIS — R918 Other nonspecific abnormal finding of lung field: Secondary | ICD-10-CM | POA: Diagnosis not present

## 2024-07-18 DIAGNOSIS — R262 Difficulty in walking, not elsewhere classified: Secondary | ICD-10-CM | POA: Diagnosis not present

## 2024-07-18 DIAGNOSIS — M5417 Radiculopathy, lumbosacral region: Secondary | ICD-10-CM | POA: Diagnosis not present

## 2024-07-19 ENCOUNTER — Encounter: Payer: Self-pay | Admitting: Family Medicine

## 2024-07-19 DIAGNOSIS — G43109 Migraine with aura, not intractable, without status migrainosus: Secondary | ICD-10-CM

## 2024-07-19 DIAGNOSIS — R110 Nausea: Secondary | ICD-10-CM

## 2024-07-20 DIAGNOSIS — R262 Difficulty in walking, not elsewhere classified: Secondary | ICD-10-CM | POA: Diagnosis not present

## 2024-07-20 DIAGNOSIS — M5417 Radiculopathy, lumbosacral region: Secondary | ICD-10-CM | POA: Diagnosis not present

## 2024-07-20 MED ORDER — ONDANSETRON HCL 4 MG PO TABS
4.0000 mg | ORAL_TABLET | Freq: Three times a day (TID) | ORAL | 1 refills | Status: DC | PRN
Start: 2024-07-20 — End: 2024-10-06

## 2024-07-20 NOTE — Telephone Encounter (Signed)
FYI, refill sent

## 2024-07-26 DIAGNOSIS — M5417 Radiculopathy, lumbosacral region: Secondary | ICD-10-CM | POA: Diagnosis not present

## 2024-07-26 DIAGNOSIS — R262 Difficulty in walking, not elsewhere classified: Secondary | ICD-10-CM | POA: Diagnosis not present

## 2024-07-28 DIAGNOSIS — M5417 Radiculopathy, lumbosacral region: Secondary | ICD-10-CM | POA: Diagnosis not present

## 2024-07-28 DIAGNOSIS — R262 Difficulty in walking, not elsewhere classified: Secondary | ICD-10-CM | POA: Diagnosis not present

## 2024-08-02 DIAGNOSIS — R262 Difficulty in walking, not elsewhere classified: Secondary | ICD-10-CM | POA: Diagnosis not present

## 2024-08-02 DIAGNOSIS — M5417 Radiculopathy, lumbosacral region: Secondary | ICD-10-CM | POA: Diagnosis not present

## 2024-08-04 DIAGNOSIS — R262 Difficulty in walking, not elsewhere classified: Secondary | ICD-10-CM | POA: Diagnosis not present

## 2024-08-04 DIAGNOSIS — M5417 Radiculopathy, lumbosacral region: Secondary | ICD-10-CM | POA: Diagnosis not present

## 2024-08-09 DIAGNOSIS — R262 Difficulty in walking, not elsewhere classified: Secondary | ICD-10-CM | POA: Diagnosis not present

## 2024-08-09 DIAGNOSIS — M5417 Radiculopathy, lumbosacral region: Secondary | ICD-10-CM | POA: Diagnosis not present

## 2024-08-11 DIAGNOSIS — R262 Difficulty in walking, not elsewhere classified: Secondary | ICD-10-CM | POA: Diagnosis not present

## 2024-08-11 DIAGNOSIS — M5417 Radiculopathy, lumbosacral region: Secondary | ICD-10-CM | POA: Diagnosis not present

## 2024-08-15 DIAGNOSIS — E1142 Type 2 diabetes mellitus with diabetic polyneuropathy: Secondary | ICD-10-CM | POA: Diagnosis not present

## 2024-08-15 DIAGNOSIS — I152 Hypertension secondary to endocrine disorders: Secondary | ICD-10-CM | POA: Diagnosis not present

## 2024-08-15 DIAGNOSIS — E1159 Type 2 diabetes mellitus with other circulatory complications: Secondary | ICD-10-CM | POA: Diagnosis not present

## 2024-08-15 DIAGNOSIS — I2510 Atherosclerotic heart disease of native coronary artery without angina pectoris: Secondary | ICD-10-CM | POA: Diagnosis not present

## 2024-08-15 DIAGNOSIS — G4730 Sleep apnea, unspecified: Secondary | ICD-10-CM | POA: Diagnosis not present

## 2024-08-15 DIAGNOSIS — K219 Gastro-esophageal reflux disease without esophagitis: Secondary | ICD-10-CM | POA: Diagnosis not present

## 2024-08-16 DIAGNOSIS — M5417 Radiculopathy, lumbosacral region: Secondary | ICD-10-CM | POA: Diagnosis not present

## 2024-08-16 DIAGNOSIS — R262 Difficulty in walking, not elsewhere classified: Secondary | ICD-10-CM | POA: Diagnosis not present

## 2024-08-18 DIAGNOSIS — R262 Difficulty in walking, not elsewhere classified: Secondary | ICD-10-CM | POA: Diagnosis not present

## 2024-08-18 DIAGNOSIS — M5417 Radiculopathy, lumbosacral region: Secondary | ICD-10-CM | POA: Diagnosis not present

## 2024-08-23 DIAGNOSIS — M5417 Radiculopathy, lumbosacral region: Secondary | ICD-10-CM | POA: Diagnosis not present

## 2024-08-23 DIAGNOSIS — R262 Difficulty in walking, not elsewhere classified: Secondary | ICD-10-CM | POA: Diagnosis not present

## 2024-08-29 DIAGNOSIS — M5417 Radiculopathy, lumbosacral region: Secondary | ICD-10-CM | POA: Diagnosis not present

## 2024-08-29 DIAGNOSIS — R262 Difficulty in walking, not elsewhere classified: Secondary | ICD-10-CM | POA: Diagnosis not present

## 2024-08-31 DIAGNOSIS — R262 Difficulty in walking, not elsewhere classified: Secondary | ICD-10-CM | POA: Diagnosis not present

## 2024-08-31 DIAGNOSIS — M5417 Radiculopathy, lumbosacral region: Secondary | ICD-10-CM | POA: Diagnosis not present

## 2024-09-01 DIAGNOSIS — K6389 Other specified diseases of intestine: Secondary | ICD-10-CM | POA: Diagnosis not present

## 2024-09-01 DIAGNOSIS — I129 Hypertensive chronic kidney disease with stage 1 through stage 4 chronic kidney disease, or unspecified chronic kidney disease: Secondary | ICD-10-CM | POA: Diagnosis not present

## 2024-09-01 DIAGNOSIS — I152 Hypertension secondary to endocrine disorders: Secondary | ICD-10-CM | POA: Diagnosis not present

## 2024-09-01 DIAGNOSIS — K219 Gastro-esophageal reflux disease without esophagitis: Secondary | ICD-10-CM | POA: Diagnosis not present

## 2024-09-01 DIAGNOSIS — E1122 Type 2 diabetes mellitus with diabetic chronic kidney disease: Secondary | ICD-10-CM | POA: Diagnosis not present

## 2024-09-01 DIAGNOSIS — E785 Hyperlipidemia, unspecified: Secondary | ICD-10-CM | POA: Diagnosis not present

## 2024-09-01 DIAGNOSIS — G8929 Other chronic pain: Secondary | ICD-10-CM | POA: Diagnosis not present

## 2024-09-01 DIAGNOSIS — G4733 Obstructive sleep apnea (adult) (pediatric): Secondary | ICD-10-CM | POA: Diagnosis not present

## 2024-09-01 DIAGNOSIS — E1159 Type 2 diabetes mellitus with other circulatory complications: Secondary | ICD-10-CM | POA: Diagnosis not present

## 2024-09-01 DIAGNOSIS — Z860100 Personal history of colon polyps, unspecified: Secondary | ICD-10-CM | POA: Diagnosis not present

## 2024-09-01 DIAGNOSIS — E1142 Type 2 diabetes mellitus with diabetic polyneuropathy: Secondary | ICD-10-CM | POA: Diagnosis not present

## 2024-09-01 DIAGNOSIS — D123 Benign neoplasm of transverse colon: Secondary | ICD-10-CM | POA: Diagnosis not present

## 2024-09-01 DIAGNOSIS — Z9989 Dependence on other enabling machines and devices: Secondary | ICD-10-CM | POA: Diagnosis not present

## 2024-09-01 DIAGNOSIS — Z7982 Long term (current) use of aspirin: Secondary | ICD-10-CM | POA: Diagnosis not present

## 2024-09-01 DIAGNOSIS — Z794 Long term (current) use of insulin: Secondary | ICD-10-CM | POA: Diagnosis not present

## 2024-09-01 DIAGNOSIS — I2510 Atherosclerotic heart disease of native coronary artery without angina pectoris: Secondary | ICD-10-CM | POA: Diagnosis not present

## 2024-09-01 DIAGNOSIS — K5730 Diverticulosis of large intestine without perforation or abscess without bleeding: Secondary | ICD-10-CM | POA: Diagnosis not present

## 2024-09-01 DIAGNOSIS — R1031 Right lower quadrant pain: Secondary | ICD-10-CM | POA: Diagnosis not present

## 2024-09-01 DIAGNOSIS — Z1211 Encounter for screening for malignant neoplasm of colon: Secondary | ICD-10-CM | POA: Diagnosis not present

## 2024-09-01 DIAGNOSIS — Z7985 Long-term (current) use of injectable non-insulin antidiabetic drugs: Secondary | ICD-10-CM | POA: Diagnosis not present

## 2024-09-01 DIAGNOSIS — Z7984 Long term (current) use of oral hypoglycemic drugs: Secondary | ICD-10-CM | POA: Diagnosis not present

## 2024-09-01 DIAGNOSIS — Z800 Family history of malignant neoplasm of digestive organs: Secondary | ICD-10-CM | POA: Diagnosis not present

## 2024-09-01 DIAGNOSIS — N182 Chronic kidney disease, stage 2 (mild): Secondary | ICD-10-CM | POA: Diagnosis not present

## 2024-09-01 DIAGNOSIS — Z955 Presence of coronary angioplasty implant and graft: Secondary | ICD-10-CM | POA: Diagnosis not present

## 2024-09-01 DIAGNOSIS — G4730 Sleep apnea, unspecified: Secondary | ICD-10-CM | POA: Diagnosis not present

## 2024-09-01 DIAGNOSIS — I252 Old myocardial infarction: Secondary | ICD-10-CM | POA: Diagnosis not present

## 2024-09-01 DIAGNOSIS — Z79899 Other long term (current) drug therapy: Secondary | ICD-10-CM | POA: Diagnosis not present

## 2024-09-04 ENCOUNTER — Encounter: Payer: Self-pay | Admitting: Family Medicine

## 2024-09-04 ENCOUNTER — Ambulatory Visit (INDEPENDENT_AMBULATORY_CARE_PROVIDER_SITE_OTHER): Admitting: Family Medicine

## 2024-09-04 VITALS — BP 116/80 | HR 72 | Temp 98.3°F | Ht 65.0 in

## 2024-09-04 DIAGNOSIS — M26621 Arthralgia of right temporomandibular joint: Secondary | ICD-10-CM | POA: Diagnosis not present

## 2024-09-04 NOTE — Progress Notes (Signed)
 Acute Office Visit  Subjective:     Patient ID: Nicole Cook, female    DOB: 1951/02/22, 73 y.o.   MRN: [id number]  Chief Complaint  Patient presents with   Otalgia    Right ear pain, noticed a knot outside of her ear, no fever, no drainage, x 1 day    HPI Discussed the use of AI scribe software for clinical note transcription with the patient, who gave verbal consent to proceed.  History of Present Illness Nicole Cook is a 73 year old female who presents with right ear pain.  She noticed a knot outside her right ear around midday yesterday, which was initially tender but not painful. As the day progressed, she experienced worsening ear pain that radiated down her neck, making it uncomfortable to sleep on her right side. She found some relief by applying compression and sleeping on her left side.  She has a history of sinus issues, noting some sinus pain last week without a significant cold. She took Sudafed and Tylenol, which provided some relief. No ear discharge, hearing loss, fever, or significant nasal congestion. Swallowing and jaw movement exacerbate the pain, leading her to consume softer foods.  She denies any dental problems but mentions a consistently dry mouth, which she attributes to her medications. She has had fluid issues with her right ear in the past, which have been sore.    Review of Systems  All other systems reviewed and are negative.       Objective:    BP 116/80   Pulse 72   Temp 98.3 F (36.8 C) (Oral)   Ht 5' 5 (1.651 m)   SpO2 95%   BMI 27.96 kg/m    Physical Exam Vitals and nursing note reviewed.  Constitutional:      Appearance: Normal appearance.  HENT:     Head: Normocephalic.     Right Ear: Tympanic membrane, ear canal and external ear normal.     Left Ear: Tympanic membrane, ear canal and external ear normal.  Eyes:     Conjunctiva/sclera: Conjunctivae normal.  Cardiovascular:     Rate and Rhythm: Normal  rate.  Pulmonary:     Effort: Pulmonary effort is normal. No respiratory distress.  Abdominal:     Palpations: Abdomen is soft.  Musculoskeletal:        General: Tenderness present. Normal range of motion.     Comments: Tenderness over right TMJ.   Skin:    General: Skin is warm.  Neurological:     Mental Status: She is alert and oriented to person, place, and time.  Psychiatric:        Mood and Affect: Mood normal.     No results found for any visits on 09/04/24.      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 No ear infection, dental issues, fever, or sinus symptoms. Normal ear examination. - Advise soft diet to minimize jaw strain. - Recommend avoiding hard or large bites, especially on the right side. - Continue acetaminophen for pain management. - Consider naproxen if acetaminophen is insufficient.    No orders of the defined types  were placed in this encounter.   No follow-ups on file.  Vinary K Kotturi, MD  z-

## 2024-09-05 DIAGNOSIS — M5417 Radiculopathy, lumbosacral region: Secondary | ICD-10-CM | POA: Diagnosis not present

## 2024-09-05 DIAGNOSIS — R262 Difficulty in walking, not elsewhere classified: Secondary | ICD-10-CM | POA: Diagnosis not present

## 2024-09-07 DIAGNOSIS — M5417 Radiculopathy, lumbosacral region: Secondary | ICD-10-CM | POA: Diagnosis not present

## 2024-09-07 DIAGNOSIS — R262 Difficulty in walking, not elsewhere classified: Secondary | ICD-10-CM | POA: Diagnosis not present

## 2024-09-11 DIAGNOSIS — E1142 Type 2 diabetes mellitus with diabetic polyneuropathy: Secondary | ICD-10-CM | POA: Diagnosis not present

## 2024-09-11 DIAGNOSIS — E1159 Type 2 diabetes mellitus with other circulatory complications: Secondary | ICD-10-CM | POA: Diagnosis not present

## 2024-09-11 DIAGNOSIS — E785 Hyperlipidemia, unspecified: Secondary | ICD-10-CM | POA: Diagnosis not present

## 2024-09-11 DIAGNOSIS — E1169 Type 2 diabetes mellitus with other specified complication: Secondary | ICD-10-CM | POA: Diagnosis not present

## 2024-09-11 DIAGNOSIS — I152 Hypertension secondary to endocrine disorders: Secondary | ICD-10-CM | POA: Diagnosis not present

## 2024-09-12 DIAGNOSIS — M5417 Radiculopathy, lumbosacral region: Secondary | ICD-10-CM | POA: Diagnosis not present

## 2024-09-12 DIAGNOSIS — R262 Difficulty in walking, not elsewhere classified: Secondary | ICD-10-CM | POA: Diagnosis not present

## 2024-09-20 DIAGNOSIS — R262 Difficulty in walking, not elsewhere classified: Secondary | ICD-10-CM | POA: Diagnosis not present

## 2024-09-20 DIAGNOSIS — M5417 Radiculopathy, lumbosacral region: Secondary | ICD-10-CM | POA: Diagnosis not present

## 2024-09-22 DIAGNOSIS — R262 Difficulty in walking, not elsewhere classified: Secondary | ICD-10-CM | POA: Diagnosis not present

## 2024-09-22 DIAGNOSIS — M5417 Radiculopathy, lumbosacral region: Secondary | ICD-10-CM | POA: Diagnosis not present

## 2024-09-25 DIAGNOSIS — L812 Freckles: Secondary | ICD-10-CM | POA: Diagnosis not present

## 2024-09-25 DIAGNOSIS — D225 Melanocytic nevi of trunk: Secondary | ICD-10-CM | POA: Diagnosis not present

## 2024-09-25 DIAGNOSIS — L304 Erythema intertrigo: Secondary | ICD-10-CM | POA: Diagnosis not present

## 2024-09-25 DIAGNOSIS — Z808 Family history of malignant neoplasm of other organs or systems: Secondary | ICD-10-CM | POA: Diagnosis not present

## 2024-09-25 DIAGNOSIS — L570 Actinic keratosis: Secondary | ICD-10-CM | POA: Diagnosis not present

## 2024-09-25 DIAGNOSIS — L821 Other seborrheic keratosis: Secondary | ICD-10-CM | POA: Diagnosis not present

## 2024-09-25 DIAGNOSIS — Z85828 Personal history of other malignant neoplasm of skin: Secondary | ICD-10-CM | POA: Diagnosis not present

## 2024-09-26 DIAGNOSIS — R262 Difficulty in walking, not elsewhere classified: Secondary | ICD-10-CM | POA: Diagnosis not present

## 2024-09-26 DIAGNOSIS — M5417 Radiculopathy, lumbosacral region: Secondary | ICD-10-CM | POA: Diagnosis not present

## 2024-09-28 DIAGNOSIS — M5417 Radiculopathy, lumbosacral region: Secondary | ICD-10-CM | POA: Diagnosis not present

## 2024-09-28 DIAGNOSIS — R262 Difficulty in walking, not elsewhere classified: Secondary | ICD-10-CM | POA: Diagnosis not present

## 2024-10-03 DIAGNOSIS — M5417 Radiculopathy, lumbosacral region: Secondary | ICD-10-CM | POA: Diagnosis not present

## 2024-10-03 DIAGNOSIS — R262 Difficulty in walking, not elsewhere classified: Secondary | ICD-10-CM | POA: Diagnosis not present

## 2024-10-04 ENCOUNTER — Other Ambulatory Visit: Payer: Self-pay | Admitting: Family Medicine

## 2024-10-04 DIAGNOSIS — R110 Nausea: Secondary | ICD-10-CM

## 2024-10-04 DIAGNOSIS — G43109 Migraine with aura, not intractable, without status migrainosus: Secondary | ICD-10-CM

## 2024-10-05 DIAGNOSIS — R262 Difficulty in walking, not elsewhere classified: Secondary | ICD-10-CM | POA: Diagnosis not present

## 2024-10-05 DIAGNOSIS — M5417 Radiculopathy, lumbosacral region: Secondary | ICD-10-CM | POA: Diagnosis not present

## 2024-10-06 NOTE — Telephone Encounter (Signed)
 Requested medication (s) are due for refill today: yes  Requested medication (s) are on the active medication list: yes  Last refill:  07/20/24  Future visit scheduled: yes  Notes to clinic:  Unable to refill per protocol, cannot delegate.      Requested Prescriptions  Pending Prescriptions Disp Refills   ondansetron  (ZOFRAN ) 4 MG tablet [Pharmacy Med Name: ONDANSETRON  HCL 4 MG TABLET] 20 tablet 1    Sig: TAKE 1 TABLET BY MOUTH EVERY 8 HOURS AS NEEDED FOR NAUSEA AND VOMITING     Not Delegated - Gastroenterology: Antiemetics - ondansetron  Failed - 10/06/2024 10:45 AM      Failed - This refill cannot be delegated      Passed - AST in normal range and within 360 days    AST  Date Value Ref Range Status  04/27/2024 15 0 - 40 IU/L Final         Passed - ALT in normal range and within 360 days    ALT  Date Value Ref Range Status  04/27/2024 15 0 - 32 IU/L Final         Passed - Valid encounter within last 6 months    Recent Outpatient Visits           1 month ago Arthralgia of right temporomandibular joint   [facility] Kotturi, Vinay K, MD   4 months ago Right lower lobe pulmonary nodule   [facility] Kotturi, Vinay K, MD   5 months ago Essential hypertension   [facility] Joshua Cathryne JAYSON, MD   7 months ago Eustachian tube disorder, right   [facility] Jones, Deanna C, MD

## 2024-10-13 DIAGNOSIS — R262 Difficulty in walking, not elsewhere classified: Secondary | ICD-10-CM | POA: Diagnosis not present

## 2024-10-13 DIAGNOSIS — M5417 Radiculopathy, lumbosacral region: Secondary | ICD-10-CM | POA: Diagnosis not present

## 2024-10-18 ENCOUNTER — Ambulatory Visit: Admitting: Family Medicine

## 2024-10-18 DIAGNOSIS — K219 Gastro-esophageal reflux disease without esophagitis: Secondary | ICD-10-CM | POA: Diagnosis not present

## 2024-10-18 DIAGNOSIS — I10 Essential (primary) hypertension: Secondary | ICD-10-CM

## 2024-10-18 DIAGNOSIS — I2510 Atherosclerotic heart disease of native coronary artery without angina pectoris: Secondary | ICD-10-CM

## 2024-10-18 DIAGNOSIS — Z9181 History of falling: Secondary | ICD-10-CM | POA: Diagnosis not present

## 2024-10-18 DIAGNOSIS — E785 Hyperlipidemia, unspecified: Secondary | ICD-10-CM | POA: Diagnosis not present

## 2024-10-18 DIAGNOSIS — E1169 Type 2 diabetes mellitus with other specified complication: Secondary | ICD-10-CM

## 2024-10-18 DIAGNOSIS — E119 Type 2 diabetes mellitus without complications: Secondary | ICD-10-CM | POA: Diagnosis not present

## 2024-10-18 DIAGNOSIS — R262 Difficulty in walking, not elsewhere classified: Secondary | ICD-10-CM | POA: Diagnosis not present

## 2024-10-18 DIAGNOSIS — M5417 Radiculopathy, lumbosacral region: Secondary | ICD-10-CM | POA: Diagnosis not present

## 2024-10-18 DIAGNOSIS — Z5181 Encounter for therapeutic drug level monitoring: Secondary | ICD-10-CM

## 2024-10-18 DIAGNOSIS — G43109 Migraine with aura, not intractable, without status migrainosus: Secondary | ICD-10-CM | POA: Diagnosis not present

## 2024-10-18 DIAGNOSIS — Z7984 Long term (current) use of oral hypoglycemic drugs: Secondary | ICD-10-CM | POA: Diagnosis not present

## 2024-10-18 DIAGNOSIS — J301 Allergic rhinitis due to pollen: Secondary | ICD-10-CM | POA: Diagnosis not present

## 2024-10-18 MED ORDER — FLUTICASONE PROPIONATE 50 MCG/ACT NA SUSP
1.0000 | Freq: Every day | NASAL | 5 refills | Status: AC
Start: 2024-10-18 — End: ?

## 2024-10-18 MED ORDER — AMLODIPINE BESYLATE 5 MG PO TABS: 5.0000 mg | ORAL_TABLET | Freq: Every day | ORAL | 1 refills | Status: AC

## 2024-10-18 MED ORDER — METOPROLOL SUCCINATE ER 50 MG PO TB24: 75.0000 mg | ORAL_TABLET | Freq: Every day | ORAL | 1 refills | Status: AC

## 2024-10-18 MED ORDER — HYDROCHLOROTHIAZIDE 25 MG PO TABS: 25.0000 mg | ORAL_TABLET | Freq: Every morning | ORAL | 1 refills | Status: AC

## 2024-10-18 MED ORDER — PANTOPRAZOLE SODIUM 40 MG PO TBEC: 40.0000 mg | DELAYED_RELEASE_TABLET | Freq: Every day | ORAL | 1 refills | Status: AC

## 2024-10-18 MED ORDER — LISINOPRIL 10 MG PO TABS: 10.0000 mg | ORAL_TABLET | Freq: Every day | ORAL | 1 refills | Status: AC

## 2024-10-18 MED ORDER — ROSUVASTATIN CALCIUM 20 MG PO TABS: 20.0000 mg | ORAL_TABLET | Freq: Every day | ORAL | 1 refills | Status: AC

## 2024-10-18 MED ORDER — SUMATRIPTAN SUCCINATE 50 MG PO TABS: 50.0000 mg | ORAL_TABLET | ORAL | 1 refills | Status: AC | PRN

## 2024-10-18 MED ORDER — MONTELUKAST SODIUM 10 MG PO TABS: 10.0000 mg | ORAL_TABLET | Freq: Every day | ORAL | 1 refills | Status: AC

## 2024-10-18 NOTE — Progress Notes (Signed)
 Established Patient Office Visit  Patient ID: [name], female    DOB: 07-Apr-1951  Age: 73 y.o. MRN: [id number] PCP: Afifa Truax K, MD  No chief complaint on file.   Subjective:     HPI  Discussed the use of AI scribe software for clinical note transcription with the patient, who gave verbal consent to proceed.  History of Present Illness Nicole Cook is a 73 year old female with past medical history of diabetes mellitus type 2, hypertension, history of MI, lung nodule, hyperlipidemia who presents for a routine follow-up visit.  She is here for her routine six-month follow-up to renew medications and get labs done. She has a history of coronary artery disease with two stents placed after a heart attack in 2011. She is currently under the care of an endocrinologist, an ophthalmologist, a pain clinic, and receives physical therapy for back issues. She also sees an OBGYN for routine exams and had back injections in June.  Her diabetes management includes a recent switch from Trulicity to Ozempic due to adverse effects. Her A1c has improved from 8.3% to 7.3% over the past five months. She experiences tingling in her left foot and reports having had neuropathy since her first back surgery in 2003.  Cymbalta and gabapentin are managed by the pain clinic.  She experiences migraines more frequently in recent months, though they are less severe. Migraines typically start with nausea followed by a headache on the right side of her head. Imitrex  is effective in managing these episodes. She has a history of migraines since college, with an increase in frequency around menopause.  She had a colonoscopy last month which revealed a small polyp, but no significant findings. She experiences occasional light-sided abdominal pain, which she associates with previous Trulicity use. She has a history of sleep apnea and uses a CPAP machine regularly. Her sleep is sometimes disrupted by  back pain, and she tends to fall asleep late but can sleep in due to retirement.  She has experienced a recent fall in the bathroom but did not sustain any injuries.    Review of Systems  All other systems reviewed and are negative.     Objective:     BP 118/66   Pulse 69   Ht 5' 5 (1.651 m)   Wt 161 lb (73 kg)   SpO2 95%   BMI 26.79 kg/m     Physical Exam Vitals and nursing note reviewed.  Constitutional:      Appearance: Normal appearance.  HENT:     Head: Normocephalic.     Right Ear: External ear normal.     Left Ear: External ear normal.  Eyes:     Conjunctiva/sclera: Conjunctivae normal.  Cardiovascular:     Rate and Rhythm: Normal rate.  Pulmonary:     Effort: Pulmonary effort is normal. No respiratory distress.  Abdominal:     Palpations: Abdomen is soft.  Musculoskeletal:        General: Normal range of motion.  Skin:    General: Skin is warm.  Neurological:     Mental Status: She is alert and oriented to person, place, and time.  Psychiatric:        Mood and Affect: Mood normal.     Physical Exam     No results found for any visits on 10/18/24.     The ASCVD Risk score (Arnett DK, et al., 2019) failed to calculate for the following reasons:   Risk  score cannot be calculated because patient has a medical history suggesting prior/existing ASCVD    Assessment & Plan:   Problem List Items Addressed This Visit       Cardiovascular and Mediastinum   Essential hypertension   Relevant Medications   amLODipine  (NORVASC ) 5 MG tablet   hydrochlorothiazide  (HYDRODIURIL ) 25 MG tablet   lisinopril  (ZESTRIL ) 10 MG tablet   rosuvastatin  (CRESTOR ) 20 MG tablet   Migraine with aura and without status migrainosus, not intractable   Relevant Medications   amLODipine  (NORVASC ) 5 MG tablet   hydrochlorothiazide  (HYDRODIURIL ) 25 MG tablet   lisinopril  (ZESTRIL ) 10 MG tablet   rosuvastatin  (CRESTOR ) 20 MG tablet   SUMAtriptan  (IMITREX ) 50 MG tablet      Respiratory   Seasonal allergic rhinitis due to pollen   Relevant Medications   fluticasone  (FLONASE ) 50 MCG/ACT nasal spray   montelukast  (SINGULAIR ) 10 MG tablet     Digestive   Gastroesophageal reflux disease   Relevant Medications   pantoprazole  (PROTONIX ) 40 MG tablet     Endocrine   Hyperlipidemia associated with type 2 diabetes mellitus (HCC)   Relevant Medications   amLODipine  (NORVASC ) 5 MG tablet   hydrochlorothiazide  (HYDRODIURIL ) 25 MG tablet   lisinopril  (ZESTRIL ) 10 MG tablet   rosuvastatin  (CRESTOR ) 20 MG tablet   Other Relevant Orders   Lipid panel   Diabetes mellitus type 2 without retinopathy (HCC)   Relevant Medications   lisinopril  (ZESTRIL ) 10 MG tablet   rosuvastatin  (CRESTOR ) 20 MG tablet     Other   Obesity, morbid (HCC) - Primary   Other Visit Diagnoses       Hyperlipidemia, unspecified hyperlipidemia type       Relevant Medications   amLODipine  (NORVASC ) 5 MG tablet   hydrochlorothiazide  (HYDRODIURIL ) 25 MG tablet   lisinopril  (ZESTRIL ) 10 MG tablet   rosuvastatin  (CRESTOR ) 20 MG tablet     Encounter for medication monitoring       Relevant Orders   CBC with Differential   Comprehensive Metabolic Panel (CMET)       Assessment and Plan Assessment & Plan  Type 2 diabetes mellitus A1c improved from 8.3% to 7.3%. Currently on Ozempic with mild queasiness. Glipizide may be discontinued once Ozempic is well tolerated. Emphasized tight blood sugar control to prevent complications. - Continue CCM - Discuss discontinuation of glipizide with endocrinologist. - Monitor blood sugar levels.  Essential hypertension Blood pressure managed with Norvasc  and hydrochlorothiazide . - Continue current antihypertensive regimen.  Hyperlipidemia Lipid panel done five months ago, triglycerides slightly elevated due to diabetes. No recheck needed due to insurance constraints. - Ordered lipid panel despite insurance not covering it.  Atherosclerotic heart  disease of native coronary artery Myocardial infarction in 2011 with two stents placed. No current chest pain or shortness of breath. On metoprolol  for cardiac protection. - Continue metoprolol .  Morbid obesity No specific discussion on weight management during this visit.  Migraine with aura Increased frequency of migraines, triggered by flashing lights and sun reflections. Managed with Imitrex , effective. No recent neurologist consultation. - Continue Imitrex  for migraine management. - Consider neurologist referral if migraines worsen.  Chronic back pain Managed with physical therapy and pain clinic medications. Recent injections in June. Physical therapy ongoing and beneficial. - Continue physical therapy. - Continue pain management regimen.  Obstructive sleep apnea Managed with CPAP. Emphasized consistent use due to narcotic use and diabetes. - Continue CPAP use.  Allergic rhinitis Managed with over-the-counter Zyrtec and Flonase . - Continue current allergy medications.  Gastroesophageal reflux disease - Continue Protonix .    No follow-ups on file.    Vinary K Jeromie Gainor, MD [facility]

## 2024-10-19 ENCOUNTER — Ambulatory Visit: Payer: Self-pay | Admitting: Family Medicine

## 2024-10-19 LAB — LIPID PANEL
Chol/HDL Ratio: 3.8 ratio (ref 0.0–4.4)
Cholesterol, Total: 140 mg/dL (ref 100–199)
HDL: 37 mg/dL — ABNORMAL LOW (ref >39)
LDL Chol Calc (NIH): 71 mg/dL (ref 0–99)
Triglycerides: 190 mg/dL — ABNORMAL HIGH (ref 0–149)
VLDL Cholesterol Cal: 32 mg/dL (ref 5–40)

## 2024-10-19 LAB — CBC WITH DIFFERENTIAL/PLATELET
Basophils Absolute: 0 x10E3/uL (ref 0.0–0.2)
Basos: 1 %
EOS (ABSOLUTE): 0.2 x10E3/uL (ref 0.0–0.4)
Eos: 4 %
Hematocrit: 35.6 % (ref 34.0–46.6)
Hemoglobin: 10.9 g/dL — ABNORMAL LOW (ref 11.1–15.9)
Immature Grans (Abs): 0 x10E3/uL (ref 0.0–0.1)
Immature Granulocytes: 0 %
Lymphocytes Absolute: 1.7 x10E3/uL (ref 0.7–3.1)
Lymphs: 37 %
MCH: 25.1 pg — ABNORMAL LOW (ref 26.6–33.0)
MCHC: 30.6 g/dL — ABNORMAL LOW (ref 31.5–35.7)
MCV: 82 fL (ref 79–97)
Monocytes Absolute: 0.3 x10E3/uL (ref 0.1–0.9)
Monocytes: 7 %
Neutrophils Absolute: 2.3 x10E3/uL (ref 1.4–7.0)
Neutrophils: 51 %
Platelets: 186 x10E3/uL (ref 150–450)
RBC: 4.35 x10E6/uL (ref 3.77–5.28)
RDW: 14.9 % (ref 11.7–15.4)
WBC: 4.5 x10E3/uL (ref 3.4–10.8)

## 2024-10-19 LAB — COMPREHENSIVE METABOLIC PANEL WITH GFR
ALT: 13 IU/L (ref 0–32)
AST: 15 IU/L (ref 0–40)
Albumin: 4.5 g/dL (ref 3.8–4.8)
Alkaline Phosphatase: 45 IU/L — ABNORMAL LOW (ref 49–135)
BUN/Creatinine Ratio: 18 (ref 12–28)
BUN: 17 mg/dL (ref 8–27)
Bilirubin Total: 0.4 mg/dL (ref 0.0–1.2)
CO2: 26 mmol/L (ref 20–29)
Calcium: 9.5 mg/dL (ref 8.7–10.3)
Chloride: 98 mmol/L (ref 96–106)
Creatinine, Ser: 0.96 mg/dL (ref 0.57–1.00)
Globulin, Total: 2.2 g/dL (ref 1.5–4.5)
Glucose: 153 mg/dL — ABNORMAL HIGH (ref 70–99)
Potassium: 4.7 mmol/L (ref 3.5–5.2)
Sodium: 138 mmol/L (ref 134–144)
Total Protein: 6.7 g/dL (ref 6.0–8.5)
eGFR: 62 mL/min/1.73 (ref >59)

## 2024-10-20 DIAGNOSIS — M5417 Radiculopathy, lumbosacral region: Secondary | ICD-10-CM | POA: Diagnosis not present

## 2024-10-20 DIAGNOSIS — R262 Difficulty in walking, not elsewhere classified: Secondary | ICD-10-CM | POA: Diagnosis not present

## 2024-10-27 DIAGNOSIS — R262 Difficulty in walking, not elsewhere classified: Secondary | ICD-10-CM | POA: Diagnosis not present

## 2024-10-27 DIAGNOSIS — M5417 Radiculopathy, lumbosacral region: Secondary | ICD-10-CM | POA: Diagnosis not present

## 2024-11-01 DIAGNOSIS — M5417 Radiculopathy, lumbosacral region: Secondary | ICD-10-CM | POA: Diagnosis not present

## 2024-11-01 DIAGNOSIS — R262 Difficulty in walking, not elsewhere classified: Secondary | ICD-10-CM | POA: Diagnosis not present

## 2024-11-13 DIAGNOSIS — Z1331 Encounter for screening for depression: Secondary | ICD-10-CM | POA: Diagnosis not present

## 2024-11-13 DIAGNOSIS — E1142 Type 2 diabetes mellitus with diabetic polyneuropathy: Secondary | ICD-10-CM | POA: Diagnosis not present

## 2024-11-13 DIAGNOSIS — I152 Hypertension secondary to endocrine disorders: Secondary | ICD-10-CM | POA: Diagnosis not present

## 2024-11-13 DIAGNOSIS — E1159 Type 2 diabetes mellitus with other circulatory complications: Secondary | ICD-10-CM | POA: Diagnosis not present

## 2024-11-13 DIAGNOSIS — Z955 Presence of coronary angioplasty implant and graft: Secondary | ICD-10-CM | POA: Diagnosis not present

## 2024-11-13 DIAGNOSIS — I2510 Atherosclerotic heart disease of native coronary artery without angina pectoris: Secondary | ICD-10-CM | POA: Diagnosis not present

## 2024-11-13 DIAGNOSIS — E1169 Type 2 diabetes mellitus with other specified complication: Secondary | ICD-10-CM | POA: Diagnosis not present

## 2024-11-13 DIAGNOSIS — E7849 Other hyperlipidemia: Secondary | ICD-10-CM | POA: Diagnosis not present

## 2025-01-02 ENCOUNTER — Other Ambulatory Visit: Payer: Self-pay | Admitting: Obstetrics and Gynecology

## 2025-01-02 DIAGNOSIS — Z1231 Encounter for screening mammogram for malignant neoplasm of breast: Secondary | ICD-10-CM

## 2025-02-07 ENCOUNTER — Encounter: Admitting: Dietician

## 2025-02-15 ENCOUNTER — Ambulatory Visit: Payer: Commercial Managed Care - PPO

## 2025-04-19 ENCOUNTER — Ambulatory Visit: Admitting: Family Medicine
